# Patient Record
Sex: Female | Born: 1985
Health system: Southern US, Community
[De-identification: ages and names within clinical notes are randomized; demographics above are authoritative.]

## PROBLEM LIST (undated history)

## (undated) ENCOUNTER — Inpatient Hospital Stay (HOSPITAL_COMMUNITY): Payer: Self-pay

## (undated) ENCOUNTER — Ambulatory Visit: Admission: EM | Payer: 59 | Source: Home / Self Care

## (undated) DIAGNOSIS — E039 Hypothyroidism, unspecified: Secondary | ICD-10-CM

## (undated) DIAGNOSIS — Z789 Other specified health status: Secondary | ICD-10-CM

## (undated) DIAGNOSIS — Q676 Pectus excavatum: Secondary | ICD-10-CM

## (undated) HISTORY — DX: Pectus excavatum: Q67.6

## (undated) HISTORY — PX: OTHER SURGICAL HISTORY: SHX169

## (undated) HISTORY — PX: APPENDECTOMY: SHX54

---

## 2007-03-08 HISTORY — PX: TONSILLECTOMY AND ADENOIDECTOMY: SUR1326

## 2007-12-26 ENCOUNTER — Observation Stay (HOSPITAL_COMMUNITY): Admission: EM | Admit: 2007-12-26 | Discharge: 2007-12-27 | Payer: Self-pay | Admitting: Emergency Medicine

## 2007-12-26 ENCOUNTER — Encounter (INDEPENDENT_AMBULATORY_CARE_PROVIDER_SITE_OTHER): Payer: Self-pay | Admitting: General Surgery

## 2008-05-27 ENCOUNTER — Ambulatory Visit: Payer: Self-pay | Admitting: Internal Medicine

## 2008-07-25 ENCOUNTER — Ambulatory Visit: Payer: Self-pay | Admitting: Internal Medicine

## 2009-03-10 ENCOUNTER — Ambulatory Visit: Payer: Self-pay | Admitting: Internal Medicine

## 2009-06-08 ENCOUNTER — Ambulatory Visit: Payer: Self-pay | Admitting: Internal Medicine

## 2010-07-20 NOTE — H&P (Signed)
Carla Gonzalez, Carla Gonzalez                ACCOUNT NO.:  [id number]   MEDICAL RECORD NO.:  [id number]          PATIENT TYPE:  INP   LOCATION:  0098                         FACILITY:  [facility]   PHYSICIAN:  Juanetta Gosling, MDDATE OF BIRTH:  [birth year]   DATE OF ADMISSION:  12/26/2007  DATE OF DISCHARGE:                              HISTORY & PHYSICAL   CHIEF COMPLAINT:  Right lower quadrant pain.   HISTORY OF PRESENT ILLNESS:  This is a 25 year old female with two days  emesis, generalized abdominal pain, now localized to the right lower  quadrant.  She has been having pressure with bowel movements, denies any  fevers.  She comes into the emergency room for evaluation of her  abdominal pain.\   PAST MEDICAL HISTORY:  Negative.   PAST SURGICAL HISTORY:  Tonsillectomy.   FAMILY HISTORY:  Negative.   ALLERGIES:  SULFA.   MEDICATIONS:  None.   REVIEW OF SYSTEMS:  Negative.   SOCIAL HISTORY:  She is a Chief of Staff.   PHYSICAL EXAMINATION:  VITAL SIGNS:  Temperature 98.7, pulse 80,  respirations 18, blood pressure 130/78.  GENERAL:  She is a well-appearing young female in no distress.  NECK:  Supple without adenopathy.  CHEST:  Clear bilaterally.  HEART:  Regular rate and rhythm.  ABDOMEN:  Soft.  She is tender to percussion of the right lower  quadrant.  Bowel sounds present.  EXTREMITIES:  Showed no edema.   LABORATORY DATA:  Her UA is negative for infection.  BUN and creatinine  13 and 0.84.  LFTs are normal.  Lipase 23.  HCG is negative, white blood  cell count is 11, hematocrit is 43, platelets are 296.   She has a CT scan of her abdomen and pelvis that shows a retrocecal  appendix that is dilated with periappendiceal inflammation indicative of  acute appendicitis.  No evidence of rupture or abscess.   IMPRESSION:  Acute appendicitis.   PLAN OF ACTION:  N.p.o. with admission.  IV antibiotics.  Laparoscopic,  possible open, appendectomy.      Juanetta Gosling, MD  Electronically Signed     MCW/MEDQ  D:  12/26/2007  T:  12/27/2007  Job:  [id number]

## 2010-07-20 NOTE — Op Note (Signed)
NAMEFABIHA, Carla Gonzalez                ACCOUNT NO.:  [id number]   MEDICAL RECORD NO.:  [id number]          PATIENT TYPE:  INP   LOCATION:  0098                         FACILITY:  [facility]   PHYSICIAN:  Juanetta Gosling, MDDATE OF BIRTH:  1986-02-04   DATE OF PROCEDURE:  12/27/2007  DATE OF DISCHARGE:                               OPERATIVE REPORT   PREOPERATIVE DIAGNOSIS:  Acute appendicitis.   POSTOPERATIVE DIAGNOSIS:  Acute suppurative appendicitis.   PROCEDURE:  Laparoscopic converted to open appendectomy.   SURGEON:  Juanetta Gosling, MD.   ASSISTANT:  None.   ANESTHESIA:  General.   FINDINGS:  Acute suppurative appendicitis adherent to the cecum with  extensive inflammation.   SPECIMENS:  Appendix to pathology.   ESTIMATED BLOOD LOSS:  Minimal.   COMPLICATIONS:  None.   DISPOSITION:  To PACU in stable condition.   HISTORY:  Carla Gonzalez is a 25 year old female who has about 2 days of  generalized abdominal pain that now is localized to her right lower  quadrant associated with some emesis as well as a white blood cell count  of 11.  She underwent a CT scan that showed acute appendicitis with  periappendiceal inflammation and a retrocecal appendix.  Plan for  laparoscopic,  possible open appendectomy.   PROCEDURE:  After informed consent was obtained, patient was first  administered 1 gram Invanz without complication.  She was taken to the  operating room.  Sequential compression devices were placed on her lower  extremities throughout the operation.  She then underwent general  endotracheal anesthesia without complication.  Foley catheter was then  placed.  Her left arm was tucked and appropriately padded.  A surgical  time out was then performed.  The abdomen was then prepped and draped in  sterile surgical fashion.  A 10 mm vertical incision was made below her  umbilicus and cut here down to the level of her fascia which was entered  sharply.  Her peritoneum was then  entered bluntly and Vicryl purse-  string suture was then placed around the fascia.  Hasson trocar was  inserted and the abdomen was insufflated to 15 mmHg pressure.  Further 5  mm port was placed in the suprapubic region and further was placed in  the right upper quadrant after infiltration with local anesthetic under  direct vision without complication.  Her appendix was noted to be  retrocecal.  The white line of Toldt was mobilized and the cecum rolled  medially exposing the appendix.  There was some dense inflammation  surrounding the appendix.  The appendix was eventually able to be  grasped and the base was identified.  The appendix for its entire length  was folded back on the cecum and was very densely adherent.  I began  dissecting this free but was not able to get a good plane and to  determine exactly where the colon and the appendix were.  I thought that  it was prudent at this point to perform an open appendectomy due to the  dense inflammation surrounding the cecum.  I desufflated the abdomen and  removed the trocars and tied the umbilical suture down with no evidence  of a hernia.  I then made an approximately 4.5 cm incision in the right  lower quadrant, entered the external abdominal oblique, did a muscle  splitting technique and identified peritoneum, which I entered sharply.  Upon doing this I rolled the cecum up into the wound.  I inspected the  entire cecum where I had taken it down from the side wall, this was  without injury.  I identified the appendix, brought that up into the  field.  The appendix was still very difficult to dissect from the cecum,  even open.  I was able to easily get around the base of the appendix and  use a GIA 55 stapler to staple across the base.  Following this I used  dissection and electrocautery to dissect the adhesions to the cecum  which were very extensive.  Again, this was very difficult but done  under direct vision without any  injury to the cecum.  Following this the  appendiceal mesentery was left, this was then clamped, the appendix was  removed and this was tied with a 2-0 Vicryl suture.  I inspected the  staple line and it was clean and was hemostatic.  I inspected the cecum  again and it all was without any injury.  Following this I then closed  the peritoneum with a 2-0 Vicryl.  The obliques were then closed with a  2-0 PDS.  Irrigation was performed.  I then closed all the incisions  with a 4-0 Monocryl in a subcuticular fashion.  Dermabond was then  placed over the wounds.  Foley catheter was removed in the operating  room.  She was extubated and was then transferred to the recovery room  in stable condition.      Juanetta Gosling, MD  Electronically Signed     MCW/MEDQ  D:  12/27/2007  T:  12/27/2007  Job:  [id number]

## 2010-10-08 ENCOUNTER — Other Ambulatory Visit: Payer: Self-pay | Admitting: Obstetrics and Gynecology

## 2010-12-07 LAB — COMPREHENSIVE METABOLIC PANEL
ALT: 14
AST: 18
Creatinine, Ser: 0.84
GFR calc non Af Amer: >60
Sodium: 140
Total Bilirubin: 0.8

## 2010-12-07 LAB — URINALYSIS, ROUTINE W REFLEX MICROSCOPIC
Hgb urine dipstick: NEGATIVE
Ketones, ur: NEGATIVE
Specific Gravity, Urine: 1.024
pH: 6.5

## 2010-12-07 LAB — LIPASE, BLOOD: Lipase: 23

## 2010-12-07 LAB — URINE MICROSCOPIC-ADD ON

## 2010-12-07 LAB — DIFFERENTIAL
Basophils Absolute: 0.1
Basophils Relative: 1
Eosinophils Absolute: 0.2
Eosinophils Relative: 2
Monocytes Absolute: 0.9
Neutrophils Relative %: 66

## 2010-12-07 LAB — POCT PREGNANCY, URINE: Preg Test, Ur: NEGATIVE

## 2010-12-07 LAB — CBC
HCT: 43
WBC: 11 — ABNORMAL HIGH

## 2011-09-01 ENCOUNTER — Encounter: Payer: Self-pay | Admitting: Internal Medicine

## 2011-09-01 ENCOUNTER — Ambulatory Visit (INDEPENDENT_AMBULATORY_CARE_PROVIDER_SITE_OTHER): Payer: 59 | Admitting: Internal Medicine

## 2011-09-01 VITALS — BP 122/76 | HR 76 | Temp 99.8°F | Ht 67.0 in | Wt 179.0 lb

## 2011-09-01 DIAGNOSIS — H6690 Otitis media, unspecified, unspecified ear: Secondary | ICD-10-CM

## 2011-09-01 DIAGNOSIS — H6693 Otitis media, unspecified, bilateral: Secondary | ICD-10-CM

## 2011-09-01 DIAGNOSIS — J069 Acute upper respiratory infection, unspecified: Secondary | ICD-10-CM

## 2011-09-02 ENCOUNTER — Encounter: Payer: Self-pay | Admitting: Internal Medicine

## 2011-09-02 NOTE — Progress Notes (Signed)
  Subjective:    Patient ID: Carla Gonzalez, female    DOB: 1985/11/25, 26 y.o.   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 Has hoarseness cough and congestion. Cough is productive. No documented fever Last menstrual period was 08/30/2011. She is on Microgestin for contraception. She had an appendectomy in 2009. Tonsillectomy and adenoidectomy at age 73. Placement of chronic tubes as a child. She is allergic to sulfa causes hives. Since I last saw her in 2011, she has gotten married. This seems to be going well. Her husband as a Games developer.    Review of Systems     Objective:   Physical Exam HEENT exam reveals bilateral otitis media with TMs dull red and retracted. Pharynx is slightly injected. Neck is supple without significant adenopathy. Chest clear.        Assessment & Plan:  Bilateral otitis media  URI  Plan: Zithromax Z-Pak take 2 tablets by mouth day one followed by 1 tablet by mouth days 2 through 5. If not better in one week have prescription refilled.

## 2011-09-02 NOTE — Patient Instructions (Addendum)
Take Zithromax Z-PAK 2 tablets by mouth day one followed by 1 tablet by mouth days 2 through 5. Not better in one week have prescription refilled.

## 2011-09-05 ENCOUNTER — Telehealth: Payer: Self-pay | Admitting: Internal Medicine

## 2011-09-05 NOTE — Telephone Encounter (Signed)
Have pt refill Z-pak and call Friday am if no better.

## 2011-09-05 NOTE — Telephone Encounter (Signed)
Patient informed. 

## 2012-10-26 ENCOUNTER — Other Ambulatory Visit: Payer: Self-pay | Admitting: Obstetrics and Gynecology

## 2013-11-13 ENCOUNTER — Other Ambulatory Visit: Payer: Self-pay | Admitting: Obstetrics and Gynecology

## 2013-11-14 LAB — CYTOLOGY - PAP

## 2014-07-10 ENCOUNTER — Ambulatory Visit (INDEPENDENT_AMBULATORY_CARE_PROVIDER_SITE_OTHER): Payer: Managed Care, Other (non HMO) | Admitting: Internal Medicine

## 2014-07-10 ENCOUNTER — Encounter: Payer: Self-pay | Admitting: Internal Medicine

## 2014-07-10 VITALS — BP 106/74 | HR 89 | Temp 98.7°F | Ht 67.0 in | Wt 180.0 lb

## 2014-07-10 DIAGNOSIS — J029 Acute pharyngitis, unspecified: Secondary | ICD-10-CM

## 2014-07-10 DIAGNOSIS — H65113 Acute and subacute allergic otitis media (mucoid) (sanguinous) (serous), bilateral: Secondary | ICD-10-CM

## 2014-07-10 LAB — POCT RAPID STREP A (OFFICE): Rapid Strep A Screen: NEGATIVE

## 2014-07-10 MED ORDER — FLUCONAZOLE 150 MG PO TABS: ORAL_TABLET | ORAL | Status: DC

## 2014-07-10 MED ORDER — CLARITHROMYCIN 500 MG PO TABS: 500.0000 mg | ORAL_TABLET | Freq: Two times a day (BID) | ORAL | Status: DC

## 2014-07-10 NOTE — Patient Instructions (Signed)
Take Biaxin 500 mg twice daily for 10 days. Diflucan if needed for Candida vaginitis. Take Sudafed PE for congestion.

## 2014-07-10 NOTE — Progress Notes (Addendum)
   Subjective:    Patient ID: Carla Gonzalez, female    DOB: May 04, 1985, 29 y.o.   MRN: [id number]  HPI  Patient has had ear pain and sore throat for several days. Has begun to cough up discolored sputum. No fever or shaking chills. Not seen here since 2013. She is a Marine scientist.  History of tonsillectomy and adenoidectomy. History of appendectomy. Had tubes in her ears as a child. Allergic to sulfa. Going on trip to [place] in one week.  She is an Therapist, sports at [employer]. She is married to a Dealer. Does not smoke or consume alcohol. No children.  Family history: Both parents living in good health. Father with history of hypertension.    Review of Systems     Objective:   Physical Exam  Skin warm and dry. Pharynx slightly injected. Rapid strep screen negative. TMs are full and red bilaterally. Neck supple without adenopathy. Chest clear to auscultation.      Assessment & Plan:  Bilateral otitis media  Pharyngitis  Plan: Biaxin 500 mg twice daily for 10 days. Diflucan 150 mg tablet x 2 should Candida vaginitis develop while on antibiotics.

## 2014-08-08 ENCOUNTER — Ambulatory Visit (INDEPENDENT_AMBULATORY_CARE_PROVIDER_SITE_OTHER): Payer: Managed Care, Other (non HMO) | Admitting: Internal Medicine

## 2014-08-08 ENCOUNTER — Encounter: Payer: Self-pay | Admitting: Internal Medicine

## 2014-08-08 VITALS — BP 108/72 | HR 74 | Temp 98.9°F | Wt 181.0 lb

## 2014-08-08 DIAGNOSIS — J329 Chronic sinusitis, unspecified: Secondary | ICD-10-CM | POA: Diagnosis not present

## 2014-08-08 MED ORDER — LEVOFLOXACIN 500 MG PO TABS: 500.0000 mg | ORAL_TABLET | Freq: Every day | ORAL | Status: DC

## 2014-08-08 MED ORDER — METHYLPREDNISOLONE ACETATE 80 MG/ML IJ SUSP
80.0000 mg | Freq: Once | INTRAMUSCULAR | Status: AC
  Administered 2014-08-08: 80 mg via INTRAMUSCULAR

## 2014-08-08 MED ORDER — CEFTRIAXONE SODIUM 1 G IJ SOLR
1.0000 g | Freq: Once | INTRAMUSCULAR | Status: AC
  Administered 2014-08-08: 1 g via INTRAMUSCULAR

## 2014-08-08 NOTE — Progress Notes (Signed)
   Subjective:    Patient ID: Carla Gonzalez, female    DOB: 1985-07-13, 29 y.o.   MRN: [id number]  HPI  Was seen May 5 with sinusitis and bilateral serous otitis media. Was treated with Biaxin 500 mg twice daily for 10 days and improved. Subsequently went on trip to [place] and returned on May 21. Then she had to go to [facility] for a review course. 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 Earlier this week she felt feverish and had some chills. Not much cough but has thick discolored nasal drainage and ear pain. Has malaise and fatigue. Contacted the office earlier today about an appointment.    Review of Systems     Objective:   Physical Exam  TMs are red and dull bilaterally. Pharynx is slightly injected. Neck is supple without adenopathy. Chest clear to auscultation without rales or wheezing      Assessment & Plan:  Acute bilateral otitis media  Acute sinusitis  Plan: Rocephin 1 g IM and Depo-Medrol 80 mg IM. Levaquin 500 milligrams daily for 10 days.

## 2014-08-08 NOTE — Patient Instructions (Signed)
Levaquin 500 mg daily with a meal for 10 days. Rest and drink plenty of fluids.

## 2015-02-17 ENCOUNTER — Other Ambulatory Visit: Payer: Self-pay | Admitting: Obstetrics and Gynecology

## 2015-02-18 LAB — CYTOLOGY - PAP

## 2015-07-23 DIAGNOSIS — H5213 Myopia, bilateral: Secondary | ICD-10-CM | POA: Diagnosis not present

## 2015-07-23 DIAGNOSIS — H52203 Unspecified astigmatism, bilateral: Secondary | ICD-10-CM | POA: Diagnosis not present

## 2015-11-17 ENCOUNTER — Ambulatory Visit (HOSPITAL_COMMUNITY)
Admission: RE | Admit: 2015-11-17 | Discharge: 2015-11-17 | Disposition: A | Discharge status: Home / Self Care | Payer: 59 | Source: Ambulatory Visit | Attending: Physician Assistant | Admitting: Physician Assistant

## 2015-11-17 ENCOUNTER — Other Ambulatory Visit: Payer: Self-pay | Admitting: Physician Assistant

## 2015-11-17 DIAGNOSIS — S99912A Unspecified injury of left ankle, initial encounter: Secondary | ICD-10-CM

## 2015-11-17 DIAGNOSIS — M79662 Pain in left lower leg: Secondary | ICD-10-CM | POA: Diagnosis not present

## 2015-11-17 DIAGNOSIS — R52 Pain, unspecified: Secondary | ICD-10-CM | POA: Diagnosis not present

## 2015-11-17 DIAGNOSIS — M7989 Other specified soft tissue disorders: Secondary | ICD-10-CM | POA: Diagnosis not present

## 2015-11-17 DIAGNOSIS — X58XXXA Exposure to other specified factors, initial encounter: Secondary | ICD-10-CM | POA: Insufficient documentation

## 2015-11-17 NOTE — Progress Notes (Signed)
Carla Gonzalez fell down the stairs at work last week. No initially significant injury, but she has developed edema, ecchymosis and pain with ambulation L ankle up to her knee.   Get X rays and Ortho referral if +.  Rosaria Ferries, PA-C 11/17/2015 1:16 PM Beeper [phone]

## 2015-11-20 ENCOUNTER — Other Ambulatory Visit: Payer: Self-pay | Admitting: Internal Medicine

## 2015-11-23 ENCOUNTER — Encounter: Payer: Self-pay | Admitting: Internal Medicine

## 2016-01-19 ENCOUNTER — Other Ambulatory Visit: Payer: 59 | Admitting: Internal Medicine

## 2016-01-19 DIAGNOSIS — Z130 Encounter for screening for diseases of the blood and blood-forming organs and certain disorders involving the immune mechanism: Secondary | ICD-10-CM | POA: Diagnosis not present

## 2016-01-19 DIAGNOSIS — Z1329 Encounter for screening for other suspected endocrine disorder: Secondary | ICD-10-CM | POA: Diagnosis not present

## 2016-01-19 DIAGNOSIS — Z1321 Encounter for screening for nutritional disorder: Secondary | ICD-10-CM | POA: Diagnosis not present

## 2016-01-19 DIAGNOSIS — Z13220 Encounter for screening for lipoid disorders: Secondary | ICD-10-CM | POA: Diagnosis not present

## 2016-01-19 DIAGNOSIS — Z0000 Encounter for general adult medical examination without abnormal findings: Secondary | ICD-10-CM

## 2016-01-19 LAB — CBC WITH DIFFERENTIAL/PLATELET
BASOS ABS: 0 {cells}/uL (ref 0–200)
Basophils Relative: 0 %
EOS ABS: 162 {cells}/uL (ref 15–500)
Eosinophils Relative: 2 %
HEMATOCRIT: 43 % (ref 35.0–45.0)
Hemoglobin: 14.4 g/dL (ref 11.7–15.5)
LYMPHS PCT: 29 %
Lymphs Abs: 2349 cells/uL (ref 850–3900)
MCH: 30.2 pg (ref 27.0–33.0)
MCHC: 33.5 g/dL (ref 32.0–36.0)
MCV: 90.1 fL (ref 80.0–100.0)
MONOS PCT: 6 %
MPV: 9.2 fL (ref 7.5–12.5)
Monocytes Absolute: 486 cells/uL (ref 200–950)
NEUTROS PCT: 63 %
Neutro Abs: 5103 cells/uL (ref 1500–7800)
PLATELETS: 242 10*3/uL (ref 140–400)
RBC: 4.77 MIL/uL (ref 3.80–5.10)
RDW: 13.5 % (ref 11.0–15.0)
WBC: 8.1 10*3/uL (ref 3.8–10.8)

## 2016-01-19 LAB — COMPLETE METABOLIC PANEL WITH GFR
ALT: 12 U/L (ref 6–29)
AST: 17 U/L (ref 10–30)
Albumin: 4.5 g/dL (ref 3.6–5.1)
Alkaline Phosphatase: 83 U/L (ref 33–115)
BILIRUBIN TOTAL: 0.6 mg/dL (ref 0.2–1.2)
BUN: 12 mg/dL (ref 7–25)
CALCIUM: 9.3 mg/dL (ref 8.6–10.2)
CHLORIDE: 103 mmol/L (ref 98–110)
CO2: 28 mmol/L (ref 20–31)
Creat: 0.73 mg/dL (ref 0.50–1.10)
Glucose, Bld: 83 mg/dL (ref 65–99)
Potassium: 4.1 mmol/L (ref 3.5–5.3)
Sodium: 137 mmol/L (ref 135–146)
Total Protein: 6.9 g/dL (ref 6.1–8.1)

## 2016-01-19 LAB — LIPID PANEL
CHOLESTEROL: 152 mg/dL (ref <200)
HDL: 61 mg/dL (ref >50)
LDL CALC: 77 mg/dL (ref <100)
TRIGLYCERIDES: 71 mg/dL (ref <150)
Total CHOL/HDL Ratio: 2.5 Ratio (ref <5.0)
VLDL: 14 mg/dL (ref <30)

## 2016-01-19 LAB — TSH: TSH: 2.3 mIU/L

## 2016-01-20 LAB — VITAMIN D 25 HYDROXY (VIT D DEFICIENCY, FRACTURES): Vit D, 25-Hydroxy: 36 ng/mL (ref 30–100)

## 2016-01-22 ENCOUNTER — Encounter: Payer: Self-pay | Admitting: Internal Medicine

## 2016-01-22 ENCOUNTER — Ambulatory Visit (INDEPENDENT_AMBULATORY_CARE_PROVIDER_SITE_OTHER): Payer: 59 | Admitting: Internal Medicine

## 2016-01-22 VITALS — BP 120/84 | HR 85 | Temp 99.5°F | Ht 67.0 in | Wt 165.0 lb

## 2016-01-22 DIAGNOSIS — Z0000 Encounter for general adult medical examination without abnormal findings: Secondary | ICD-10-CM | POA: Diagnosis not present

## 2016-01-22 DIAGNOSIS — J069 Acute upper respiratory infection, unspecified: Secondary | ICD-10-CM

## 2016-01-22 LAB — POCT URINALYSIS DIPSTICK
BILIRUBIN UA: NEGATIVE
Glucose, UA: NEGATIVE
LEUKOCYTES UA: NEGATIVE
NITRITE UA: NEGATIVE
PH UA: 7
Protein, UA: NEGATIVE
Spec Grav, UA: 1.01
UROBILINOGEN UA: NEGATIVE

## 2016-01-22 MED ORDER — LEVOFLOXACIN 500 MG PO TABS: 500.0000 mg | ORAL_TABLET | Freq: Every day | ORAL | 0 refills | Status: DC

## 2016-01-22 MED FILL — levoFLOXacin 500 MG TABS: 500 | 10 days supply | Qty: 10 | Fill #0

## 2016-01-22 NOTE — Progress Notes (Signed)
   Subjective:    Patient ID: Carla Gonzalez, female    DOB: 1985-12-14, 30 y.o.   MRN: [id number]  HPI  30 year old Female in today for health maintenance exam and evaluation of medical issues. She has an acute URI. Has had nasal congestion and cough. Some malaise and fatigue.  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 She likes this.  Social history: She is married. Thinking of starting a family. Husband is a Dealer. 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  Says she had Tdap vaccine 2 years ago. Had flu vaccine in October.  She is allergic to Sulfa- it causes hives.  No history of serious illnesses  or accidents. Had appendectomy in 2009.  Family history: Parents in good health. Younger sister in good health.      Review of Systems  Respiratory: Positive for cough.   Cardiovascular: Negative.   Gastrointestinal: Negative.   Genitourinary: Negative.   Musculoskeletal: Negative.   Neurological: Negative.   Hematological: Negative.   Psychiatric/Behavioral: Negative.        Objective:   Physical Exam  Constitutional: She is oriented to person, place, and time. She appears well-developed and well-nourished. No distress.  HENT:  Head: Normocephalic and atraumatic.  Right Ear: External ear normal.  Left Ear: External ear normal.  Mouth/Throat: Oropharynx is clear and moist. No oropharyngeal exudate.  Eyes: Conjunctivae and EOM are normal. Pupils are equal, round, and reactive to light. Right eye exhibits no discharge. Left eye exhibits no discharge. No scleral icterus.  Neck: Neck supple. No JVD present. No thyromegaly present.  Cardiovascular: Normal rate, regular rhythm, normal heart sounds and intact distal pulses.   Pulmonary/Chest: Effort normal and breath sounds normal. No respiratory distress. She has no wheezes. She has no rales.  Abdominal: She exhibits no distension and no  mass. There is no tenderness. There is no rebound and no guarding.  Genitourinary:  Genitourinary Comments: Pap done by Dr. Vanessa Kick December 2016  Musculoskeletal: She exhibits no edema.  Lymphadenopathy:    She has no cervical adenopathy.  Neurological: She is alert and oriented to person, place, and time. She has normal reflexes. No cranial nerve deficit. Coordination normal.  Skin: Skin is warm and dry. No rash noted. She is not diaphoretic.  Psychiatric: She has a normal mood and affect. Her behavior is normal. Judgment and thought content normal.  Vitals reviewed.         Assessment & Plan:  Acute URI  Normal health maintenance exam  Plan: Levaquin 500 milligrams daily for 10 days. Take with food. Lab work reviewed with her entirely within normal limits.

## 2016-02-01 NOTE — Patient Instructions (Addendum)
It was a pleasure to see you today. Take Levaquin 500 mg daily with a meal for 10 days for respiratory infection

## 2016-02-24 ENCOUNTER — Other Ambulatory Visit: Payer: Self-pay | Admitting: Obstetrics and Gynecology

## 2016-02-24 DIAGNOSIS — N926 Irregular menstruation, unspecified: Secondary | ICD-10-CM | POA: Diagnosis not present

## 2016-02-24 DIAGNOSIS — Z8481 Family history of carrier of genetic disease: Secondary | ICD-10-CM | POA: Diagnosis not present

## 2016-02-24 DIAGNOSIS — Z01419 Encounter for gynecological examination (general) (routine) without abnormal findings: Secondary | ICD-10-CM | POA: Diagnosis not present

## 2016-02-24 DIAGNOSIS — Z124 Encounter for screening for malignant neoplasm of cervix: Secondary | ICD-10-CM | POA: Diagnosis not present

## 2016-02-25 LAB — CYTOLOGY - PAP

## 2016-02-26 ENCOUNTER — Ambulatory Visit (INDEPENDENT_AMBULATORY_CARE_PROVIDER_SITE_OTHER): Payer: 59 | Admitting: Internal Medicine

## 2016-02-26 ENCOUNTER — Encounter: Payer: Self-pay | Admitting: Internal Medicine

## 2016-02-26 ENCOUNTER — Other Ambulatory Visit: Payer: Self-pay | Admitting: Internal Medicine

## 2016-02-26 VITALS — HR 102 | Temp 99.0°F | Resp 18 | Wt 174.0 lb

## 2016-02-26 DIAGNOSIS — H6692 Otitis media, unspecified, left ear: Secondary | ICD-10-CM | POA: Diagnosis not present

## 2016-02-26 DIAGNOSIS — R509 Fever, unspecified: Secondary | ICD-10-CM | POA: Diagnosis not present

## 2016-02-26 DIAGNOSIS — J0100 Acute maxillary sinusitis, unspecified: Secondary | ICD-10-CM

## 2016-02-26 DIAGNOSIS — Z3A01 Less than 8 weeks gestation of pregnancy: Secondary | ICD-10-CM

## 2016-02-26 DIAGNOSIS — O200 Threatened abortion: Secondary | ICD-10-CM | POA: Diagnosis not present

## 2016-02-26 LAB — INFLUENZA A AND B AG, IMMUNOASSAY
INFLUENZA B ANTIGEN: NOT DETECTED
Influenza A Antigen: NOT DETECTED

## 2016-02-26 MED ORDER — AZITHROMYCIN 250 MG PO TABS: ORAL_TABLET | ORAL | 0 refills | Status: DC

## 2016-02-26 MED FILL — AZITHROMYCIN 250 MG TABLET: 250 | 5 days supply | Qty: 6 | Fill #0

## 2016-02-26 NOTE — Patient Instructions (Addendum)
Zithromax Z-PAK take 2 tablets day one followed by 1 tablet days 2 through 5. Rest and drink plenty of fluids. Out of work until afebrile for 24 hours. May take Tylenol for fever. But take Robitussin for cough and Sudafed for congestion. Flu test was negative. Call if not better next week or sooner if worse.

## 2016-02-26 NOTE — Progress Notes (Signed)
   Subjective:    Patient ID: Carla Gonzalez, female    DOB: 06/12/1985, 30 y.o.   MRN: [id number]  HPI  Onset of fever last evening 101 degrees with URI symptoms x 4 days. She says she Is about [redacted] weeks pregnant. Has seen GYN, Dr. Harrington Challenger. Has malaise and fatigue. Discolored nasal drainage.  Exposed to a relative with influenza recently. She did take flu vaccine through employment.    Review of Systems      Objective:   Physical Exam Pharynx very slightly injected without exudate. Left TM is dull and red with splayed light reflex. Right TM slightly full but not red. Neck is supple without adenopathy. Chest clear to auscultation without rales or wheezing       Assessment & Plan:   Left otitis media  Acute sinusitis  Intrauterine pregnancy-approximately 2 weeks by history  Plan: May take Tylenol for fever, Robitussin for cough. Zithromax Z-PAK take 2 tablets day one followed by 1 tablet days 2 through 5. Rest and drink plenty of fluids. May take Sudafed for congestion. She called out of work today and is not scheduled to work again until next week. Needs to be afebrile for 24 hours before returning to work.  Rapid influenza test is negative for influenza A and B and patient was called and informed of this result.

## 2016-03-02 ENCOUNTER — Encounter (HOSPITAL_COMMUNITY): Payer: Self-pay

## 2016-03-02 ENCOUNTER — Inpatient Hospital Stay (HOSPITAL_COMMUNITY)
Admission: AD | Admit: 2016-03-02 | Discharge: 2016-03-02 | Disposition: A | Discharge status: Home / Self Care | Payer: 59 | Source: Ambulatory Visit | Attending: Obstetrics and Gynecology | Admitting: Obstetrics and Gynecology

## 2016-03-02 DIAGNOSIS — N939 Abnormal uterine and vaginal bleeding, unspecified: Secondary | ICD-10-CM | POA: Diagnosis not present

## 2016-03-02 DIAGNOSIS — Z8249 Family history of ischemic heart disease and other diseases of the circulatory system: Secondary | ICD-10-CM | POA: Diagnosis not present

## 2016-03-02 DIAGNOSIS — Z882 Allergy status to sulfonamides status: Secondary | ICD-10-CM | POA: Diagnosis not present

## 2016-03-02 DIAGNOSIS — O039 Complete or unspecified spontaneous abortion without complication: Secondary | ICD-10-CM | POA: Diagnosis not present

## 2016-03-02 HISTORY — DX: Other specified health status: Z78.9

## 2016-03-02 LAB — CBC
HCT: 39.4 % (ref 36.0–46.0)
Hemoglobin: 13.7 g/dL (ref 12.0–15.0)
MCH: 30.2 pg (ref 26.0–34.0)
MCHC: 34.8 g/dL (ref 30.0–36.0)
MCV: 86.8 fL (ref 78.0–100.0)
PLATELETS: 253 10*3/uL (ref 150–400)
RBC: 4.54 MIL/uL (ref 3.87–5.11)
RDW: 13.2 % (ref 11.5–15.5)
WBC: 8.5 10*3/uL (ref 4.0–10.5)

## 2016-03-02 LAB — URINALYSIS, ROUTINE W REFLEX MICROSCOPIC
Bilirubin Urine: NEGATIVE
GLUCOSE, UA: NEGATIVE mg/dL
Ketones, ur: NEGATIVE mg/dL
Leukocytes, UA: NEGATIVE
NITRITE: NEGATIVE
Protein, ur: 30 mg/dL — AB
SPECIFIC GRAVITY, URINE: 1.021 (ref 1.005–1.030)
pH: 7 (ref 5.0–8.0)

## 2016-03-02 LAB — POCT PREGNANCY, URINE: Preg Test, Ur: POSITIVE — AB

## 2016-03-02 LAB — HCG, QUANTITATIVE, PREGNANCY: hCG, Beta Chain, Quant, S: 15 m[IU]/mL — ABNORMAL HIGH (ref <5)

## 2016-03-02 LAB — ABO/RH: ABO/RH(D): O POS

## 2016-03-02 NOTE — MAU Note (Signed)
Pregnancy confirmed in OB office last Wed.  Had blood work done, hcg level was in the 40's, repeated on Friday was 50.  Started bleeding last night, was kind of heavy.  Started having mild cramps this morning.

## 2016-03-02 NOTE — Discharge Instructions (Signed)
Return to care  °· If you have heavier bleeding that soaks through more that 2 pads per hour for an hour or more °· If you bleed so much that you feel like you might pass out or you do pass out °· If you have significant abdominal pain that is not improved with Tylenol  °· If you develop a fever > 100.5 ° ° ° ° °Miscarriage °A miscarriage is the sudden loss of an unborn baby (fetus) before the 20th week of pregnancy. Most miscarriages happen in the first 3 months of pregnancy. Sometimes, it happens before a woman even knows she is pregnant. A miscarriage is also called a "spontaneous miscarriage" or "early pregnancy loss." Having a miscarriage can be an emotional experience. Talk with your caregiver about any questions you may have about miscarrying, the grieving process, and your future pregnancy plans. °What are the causes? °· Problems with the fetal chromosomes that make it impossible for the baby to develop normally. Problems with the baby's genes or chromosomes are most often the result of errors that occur, by chance, as the embryo divides and grows. The problems are not inherited from the parents. °· Infection of the cervix or uterus. °· Hormone problems. °· Problems with the cervix, such as having an incompetent cervix. This is when the tissue in the cervix is not strong enough to hold the pregnancy. °· Problems with the uterus, such as an abnormally shaped uterus, uterine fibroids, or congenital abnormalities. °· Certain medical conditions. °· Smoking, drinking alcohol, or taking illegal drugs. °· Trauma. °Often, the cause of a miscarriage is unknown. °What are the signs or symptoms? °· Vaginal bleeding or spotting, with or without cramps or pain. °· Pain or cramping in the abdomen or lower back. °· Passing fluid, tissue, or blood clots from the vagina. °How is this diagnosed? °Your caregiver will perform a physical exam. You may also have an ultrasound to confirm the miscarriage. Blood or urine tests may  also be ordered. °How is this treated? °· Sometimes, treatment is not necessary if you naturally pass all the fetal tissue that was in the uterus. If some of the fetus or placenta remains in the body (incomplete miscarriage), tissue left behind may become infected and must be removed. Usually, a dilation and curettage (D and C) procedure is performed. During a D and C procedure, the cervix is widened (dilated) and any remaining fetal or placental tissue is gently removed from the uterus. °· Antibiotic medicines are prescribed if there is an infection. Other medicines may be given to reduce the size of the uterus (contract) if there is a lot of bleeding. °· If you have Rh negative blood and your baby was Rh positive, you will need a Rh immunoglobulin shot. This shot will protect any future baby from having Rh blood problems in future pregnancies. °Follow these instructions at home: °· Your caregiver may order bed rest or may allow you to continue light activity. Resume activity as directed by your caregiver. °· Have someone help with home and family responsibilities during this time. °· Keep track of the number of sanitary pads you use each day and how soaked (saturated) they are. Write down this information. °· Do not use tampons. Do not douche or have sexual intercourse until approved by your caregiver. °· Only take over-the-counter or prescription medicines for pain or discomfort as directed by your caregiver. °· Do not take aspirin. Aspirin can cause bleeding. °· Keep all follow-up appointments with your caregiver. °·   If you or your partner have problems with grieving, talk to your caregiver or seek counseling to help cope with the pregnancy loss. Allow enough time to grieve before trying to get pregnant again. °Get help right away if: °· You have severe cramps or pain in your back or abdomen. °· You have a fever. °· You pass large blood clots (walnut-sized or larger) or tissue from your vagina. Save any tissue  for your caregiver to inspect. °· Your bleeding increases. °· You have a thick, bad-smelling vaginal discharge. °· You become lightheaded, weak, or you faint. °· You have chills. °This information is not intended to replace advice given to you by your health care provider. Make sure you discuss any questions you have with your health care provider. °Document Released: 08/17/2000 Document Revised: 07/30/2015 Document Reviewed: 04/12/2011 °Elsevier Interactive Patient Education © 2017 [employer] ° °

## 2016-03-02 NOTE — MAU Provider Note (Signed)
History     CSN: [id number]  Arrival date and time: 03/02/16 [id number]   First Provider Initiated Contact with Patient 03/02/16 [phone]      Chief Complaint  Patient presents with  . Vaginal Bleeding  . Abdominal Cramping   HPI  Carla Gonzalez is a 30 y.o. G1P0 at [redacted]w[redacted]d who presents with abdominal cramping & vaginal bleeding. Patient was seen at [facility] last week for positive pregnancy test. Had BHCG 43 on Wednesday & 50 on Friday. Reports red spotting since yesterday; bleeding became heavier this morning and passed a few small clots. Has not saturated pads. Lower abdominal cramping associated with bleeding. Cramping is intermittent. Rates pain 1/10. Nothing makes better or worse. Denies n/v/d, constipation, vaginal discharge, or fever. Last intercourse 2 days ago. Last BM yesterday.   OB History    Gravida Para Term Preterm AB Living   1             SAB TAB Ectopic Multiple Live Births                  Past Medical History:  Diagnosis Date  . Contraception     Past Surgical History:  Procedure Laterality Date  . APPENDECTOMY    . Grommet    . TONSILLECTOMY AND ADENOIDECTOMY  2009    Family History  Problem Relation Age of Onset  . Hypertension Father     Social History  Substance Use Topics  . Smoking status: Never Smoker  . Smokeless tobacco: Never Used  . Alcohol use Not on file    Allergies:  Allergies  Allergen Reactions  . Sulfa Antibiotics Hives    Prescriptions Prior to Admission  Medication Sig Dispense Refill Last Dose  . azithromycin (ZITHROMAX) 250 MG tablet 2 po day 1 followed by one po days 2-5 6 tablet 0     Review of Systems  Constitutional: Negative.   Gastrointestinal: Positive for abdominal pain. Negative for constipation, diarrhea, nausea and vomiting.  Genitourinary:       + vaginal bleeding   Physical Exam   Blood pressure 125/83, pulse 66, temperature 98.3 F (36.8 C), resp. rate 18, height 5' 6.5" (1.689 m), weight 176  lb 8 oz (80.1 kg), last menstrual period 01/24/2016.  Physical Exam  Nursing note and vitals reviewed. Constitutional: She is oriented to person, place, and time. She appears well-developed and well-nourished. No distress.  HENT:  Head: Normocephalic and atraumatic.  Eyes: Conjunctivae are normal. Right eye exhibits no discharge. Left eye exhibits no discharge. No scleral icterus.  Neck: Normal range of motion.  Cardiovascular: Normal rate, regular rhythm and normal heart sounds.   No murmur heard. Respiratory: Effort normal and breath sounds normal. No respiratory distress. She has no wheezes.  GI: Soft. Bowel sounds are normal. She exhibits no distension. There is no tenderness. There is no rebound and no guarding.  Genitourinary: Uterus normal. Cervix exhibits no motion tenderness and no friability. Right adnexum displays no mass and no tenderness. Left adnexum displays no mass and no tenderness. There is bleeding (small amount of dark red blood cleared out with 2 fox swabs; no active bleeding) in the vagina.  Genitourinary Comments: Cervix closed  Neurological: She is alert and oriented to person, place, and time.  Skin: Skin is warm and dry. She is not diaphoretic.  Psychiatric: She has a normal mood and affect. Her behavior is normal. Judgment and thought content normal.    MAU Course  Procedures Results  for orders placed or performed during the hospital encounter of 03/02/16 (from the past 24 hour(s))  Pregnancy, urine POC     Status: Abnormal   Collection Time: 03/02/16  8:53 AM  Result Value Ref Range   Preg Test, Ur POSITIVE (A) NEGATIVE  Urinalysis, Routine w reflex microscopic     Status: Abnormal   Collection Time: 03/02/16  8:58 AM  Result Value Ref Range   Color, Urine YELLOW YELLOW   APPearance CLOUDY (A) CLEAR   Specific Gravity, Urine 1.021 1.005 - 1.030   pH 7.0 5.0 - 8.0   Glucose, UA NEGATIVE NEGATIVE mg/dL   Hgb urine dipstick LARGE (A) NEGATIVE   Bilirubin  Urine NEGATIVE NEGATIVE   Ketones, ur NEGATIVE NEGATIVE mg/dL   Protein, ur 30 (A) NEGATIVE mg/dL   Nitrite NEGATIVE NEGATIVE   Leukocytes, UA NEGATIVE NEGATIVE   RBC / HPF TOO NUMEROUS TO COUNT 0 - 5 RBC/hpf   WBC, UA 6-30 0 - 5 WBC/hpf   Bacteria, UA RARE (A) NONE SEEN   Squamous Epithelial / LPF 0-5 (A) NONE SEEN   Mucous PRESENT   CBC     Status: None   Collection Time: 03/02/16  9:30 AM  Result Value Ref Range   WBC 8.5 4.0 - 10.5 K/uL   RBC 4.54 3.87 - 5.11 MIL/uL   Hemoglobin 13.7 12.0 - 15.0 g/dL   HCT 39.4 36.0 - 46.0 %   MCV 86.8 78.0 - 100.0 fL   MCH 30.2 26.0 - 34.0 pg   MCHC 34.8 30.0 - 36.0 g/dL   RDW 13.2 11.5 - 15.5 %   Platelets 253 150 - 400 K/uL  ABO/Rh     Status: None (Preliminary result)   Collection Time: 03/02/16  9:30 AM  Result Value Ref Range   ABO/RH(D) O POS   hCG, quantitative, pregnancy     Status: Abnormal   Collection Time: 03/02/16  9:31 AM  Result Value Ref Range   hCG, Beta Chain, Quant, S 15 (H) <5 mIU/mL    MDM CBC, BHCG, abo/rh O positive Bhcg 15 today Discussed results with Dr. Ouida Sills. Ok to discharge home & have pt f/u in office within 2 weeks.  Discussed results with patient. Drop in Bhcg is consistent with miscarriage. Patient is appropriately tearful. Offered spiritual services; pt declines at this time.   Assessment and Plan  A:  1. Miscarriage    P: Discharge home Pelvic rest Call office for f/u in 2 weeks Comfort pack given Discussed reasons to return to [place] 03/02/2016, 9:10 AM

## 2016-03-09 DIAGNOSIS — O021 Missed abortion: Secondary | ICD-10-CM | POA: Diagnosis not present

## 2016-04-04 DIAGNOSIS — Z6827 Body mass index (BMI) 27.0-27.9, adult: Secondary | ICD-10-CM | POA: Diagnosis not present

## 2016-04-04 DIAGNOSIS — Z8279 Family history of other congenital malformations, deformations and chromosomal abnormalities: Secondary | ICD-10-CM | POA: Diagnosis not present

## 2016-04-04 DIAGNOSIS — Z3141 Encounter for fertility testing: Secondary | ICD-10-CM | POA: Diagnosis not present

## 2016-04-04 DIAGNOSIS — Q992 Fragile X chromosome: Secondary | ICD-10-CM | POA: Insufficient documentation

## 2016-04-14 ENCOUNTER — Other Ambulatory Visit: Payer: Self-pay | Admitting: Cardiovascular Disease

## 2016-04-14 MED ORDER — METAXALONE 800 MG PO TABS: 800.0000 mg | ORAL_TABLET | Freq: Three times a day (TID) | ORAL | 6 refills | Status: DC

## 2016-04-14 MED FILL — METAXALONE 800 MG TABLET: 800 | 10 days supply | Qty: 30 | Fill #0

## 2016-04-14 NOTE — Progress Notes (Signed)
Linsay has neck pain and stiffness Has taken Skelaxin in the past  Will send in a script    Mertie Moores, MD  04/14/2016 8:26 AM    [address],  [place] [place], [place]  [address] Pager [phone] Phone: [phone]; Fax: [phone]

## 2016-05-12 ENCOUNTER — Telehealth: Payer: Self-pay | Admitting: Internal Medicine

## 2016-05-12 MED ORDER — SCOPOLAMINE 1 MG/3DAYS TD PT72: 1.0000 | MEDICATED_PATCH | TRANSDERMAL | 0 refills | Status: DC

## 2016-05-12 MED FILL — SCOPOLAMINE 1 MG/3 DAY PATC: 1 | 30 days supply | Qty: 10 | Fill #0

## 2016-05-12 NOTE — Telephone Encounter (Signed)
Patient is leaving to go out of town tomorrow and will be flying.  She wants to know if you will prescribe something for motion sickness for her.  She is asking specifically for these discs that go behind the ear to use for motion sickness.  Can this be prescribed for her?  Pharmacy:  [facility]

## 2016-05-12 NOTE — Telephone Encounter (Signed)
SENT 

## 2016-05-12 NOTE — Telephone Encounter (Signed)
Call in transderm Scop transdermal patches apply behind ear q 3 days

## 2016-05-30 ENCOUNTER — Ambulatory Visit (INDEPENDENT_AMBULATORY_CARE_PROVIDER_SITE_OTHER): Payer: 59 | Admitting: Internal Medicine

## 2016-05-30 ENCOUNTER — Encounter: Payer: Self-pay | Admitting: Internal Medicine

## 2016-05-30 VITALS — BP 100/60 | HR 64 | Temp 99.1°F | Ht 67.0 in | Wt 176.0 lb

## 2016-05-30 DIAGNOSIS — H6693 Otitis media, unspecified, bilateral: Secondary | ICD-10-CM

## 2016-05-30 DIAGNOSIS — J0100 Acute maxillary sinusitis, unspecified: Secondary | ICD-10-CM | POA: Diagnosis not present

## 2016-05-30 DIAGNOSIS — J22 Unspecified acute lower respiratory infection: Secondary | ICD-10-CM

## 2016-05-30 MED ORDER — AZITHROMYCIN 250 MG PO TABS: ORAL_TABLET | ORAL | 0 refills | Status: DC

## 2016-05-30 MED FILL — AZITHROMYCIN 250 MG TABLET: 250 | 5 days supply | Qty: 6 | Fill #0

## 2016-05-30 NOTE — Patient Instructions (Signed)
It was a pleasure to see you today. Take Zithromax Z-PAK as directed. May take Robitussin and Sudafed if needed. Tylenol if needed for fever. Call if not better in 7-10 days or sooner if worse.

## 2016-05-30 NOTE — Progress Notes (Signed)
   Subjective:    Patient ID: Carla Gonzalez, female    DOB: 07/02/1985, 31 y.o.   MRN: [id number]  HPI   31 year old Female went to Cardiology [place] in [place] and returned with URI symptoms.  No fever or chills. Discolored sputum and nasal drainage.Says she has deep congested cough.  Was seen in November during health maintenance exam without acute URI treated with Levaquin.  Was seen December 22 with an acute left otitis media treated with Zithromax Z-PAK. At that time she was about [redacted] weeks pregnant. Unfortunately had a miscarriage 7 days later.  This time, she is due for menstrual period in a couple of weeks or so. Has not been using birth control. Hoping to conceive.  She sounds nasally congested when she speaks.    Review of Systems see above     Objective:   Physical Exam TMs are full and pink bilaterally. Pharynx slightly injected without exudate. Neck is supple. Chest clear to auscultation without rales or wheezing       Assessment & Plan:  Acute bilateral otitis media  Acute bronchitis  Acute maxillary sinusitis  Plan: Zithromax Z-PAK take 2 tablets day 1 followed by 1 tablet days 2 through 5. Robitussin or Sudafed as needed. Call if not better in 7-10 days or sooner if worse.

## 2016-08-12 ENCOUNTER — Other Ambulatory Visit: Payer: Self-pay

## 2016-08-12 DIAGNOSIS — D492 Neoplasm of unspecified behavior of bone, soft tissue, and skin: Secondary | ICD-10-CM | POA: Diagnosis not present

## 2016-09-23 DIAGNOSIS — E2839 Other primary ovarian failure: Secondary | ICD-10-CM | POA: Diagnosis not present

## 2016-09-23 DIAGNOSIS — Z148 Genetic carrier of other disease: Secondary | ICD-10-CM | POA: Diagnosis not present

## 2016-09-23 DIAGNOSIS — Z319 Encounter for procreative management, unspecified: Secondary | ICD-10-CM | POA: Diagnosis not present

## 2016-09-24 DIAGNOSIS — Z319 Encounter for procreative management, unspecified: Secondary | ICD-10-CM | POA: Diagnosis not present

## 2016-09-30 DIAGNOSIS — Z113 Encounter for screening for infections with a predominantly sexual mode of transmission: Secondary | ICD-10-CM | POA: Diagnosis not present

## 2016-12-14 ENCOUNTER — Other Ambulatory Visit: Payer: Self-pay | Admitting: Cardiovascular Disease

## 2016-12-14 DIAGNOSIS — R062 Wheezing: Secondary | ICD-10-CM

## 2016-12-14 MED ORDER — ALBUTEROL SULFATE HFA 108 (90 BASE) MCG/ACT IN AERS: 2.0000 | INHALATION_SPRAY | Freq: Four times a day (QID) | RESPIRATORY_TRACT | 2 refills | Status: DC | PRN

## 2016-12-14 MED ORDER — ALBUTEROL SULFATE HFA 108 (90 BASE) MCG/ACT IN AERS: 2.0000 | INHALATION_SPRAY | Freq: Four times a day (QID) | RESPIRATORY_TRACT | Status: DC | PRN

## 2016-12-14 MED FILL — VENTOLIN HFA 90 MCG INHALER: 108 (90 BAS | 25 days supply | Qty: 18 | Fill #0

## 2016-12-14 NOTE — Progress Notes (Signed)
Carla Gonzalez has been having cough and wheezing   Slight fever   Try claritin, zyrtec. 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 Will send in script for Albuterol HFA     Mertie Moores, MD  12/14/2016 11:02 AM    [address],  [place], [place]  [address] Pager [phone] Phone: [phone]; Fax: [phone]

## 2016-12-14 NOTE — Progress Notes (Signed)
Re ordered albuterol  As an outpatient medication

## 2017-01-06 DIAGNOSIS — Q51810 Arcuate uterus: Secondary | ICD-10-CM | POA: Diagnosis not present

## 2017-01-06 DIAGNOSIS — Z3141 Encounter for fertility testing: Secondary | ICD-10-CM | POA: Diagnosis not present

## 2017-01-06 DIAGNOSIS — N8500 Endometrial hyperplasia, unspecified: Secondary | ICD-10-CM | POA: Diagnosis not present

## 2017-01-06 DIAGNOSIS — Z319 Encounter for procreative management, unspecified: Secondary | ICD-10-CM | POA: Diagnosis not present

## 2017-01-06 MED FILL — DOXYCYCLINE HYCLATE 100 MG: 100 | 5 days supply | Qty: 10 | Fill #0

## 2017-01-10 MED FILL — CETROTIDE 0.25 MG KIT: 0.25 | 4 days supply | Qty: 4 | Fill #0

## 2017-01-10 MED FILL — BD 3 ML SYRINGE 18GX1-1/2: 18G X 1-1/2 | 30 days supply | Qty: 60 | Fill #0

## 2017-01-10 MED FILL — PROGESTERONE OIL 50 MG/ML V: 50 | 30 days supply | Qty: 30 | Fill #0

## 2017-01-10 MED FILL — BD NEEDLES 30GX0.5: 30G X 1/2" | 30 days supply | Qty: 25 | Fill #0

## 2017-01-10 MED FILL — ESTRADIOL 2 MG TABLET: 2 | 30 days supply | Qty: 60 | Fill #0

## 2017-01-10 MED FILL — DOXYCYCLINE HYCLATE 100 MG: 100 | 20 days supply | Qty: 40 | Fill #0

## 2017-01-10 MED FILL — BD 3 ML SYRINGE 18GX1-1/2": 18G X 1-1/2 | 30 days supply | Qty: 60 | Fill #0

## 2017-01-10 MED FILL — BD NEEDLES 22GX1.5: 22G X 1-1/2 | 30 days supply | Qty: 30 | Fill #0

## 2017-01-10 MED FILL — ESTRADIOL 0.1 MG PATCH: 0.1 | 30 days supply | Qty: 8 | Fill #0

## 2017-01-10 MED FILL — CHORIONIC GONAD 10,000 UNIT: 10000 | 1 days supply | Qty: 1 | Fill #0

## 2017-01-10 MED FILL — METHYLPREDNISOLONE 4 MG TAB: 4 | 4 days supply | Qty: 16 | Fill #0

## 2017-01-10 MED FILL — BD NEEDLES 30GX0.5": 30G X 1/2" | 30 days supply | Qty: 25 | Fill #0

## 2017-01-10 MED FILL — MENOPUR 75 UNIT VIAL: 75 | 20 days supply | Qty: 20 | Fill #0

## 2017-01-10 MED FILL — GONAL-F 1,050 UNITS VIAL: 1050 | 10 days supply | Qty: 3 | Fill #0

## 2017-01-10 MED FILL — SHARPS COLLECTOR 1.4QT: 30 days supply | Qty: 1 | Fill #0

## 2017-01-10 MED FILL — BD NEEDLES 22GX1.5": 22G X 1-1/2 | 30 days supply | Qty: 30 | Fill #0

## 2017-01-20 DIAGNOSIS — Z3183 Encounter for assisted reproductive fertility procedure cycle: Secondary | ICD-10-CM | POA: Diagnosis not present

## 2017-01-23 DIAGNOSIS — Z3183 Encounter for assisted reproductive fertility procedure cycle: Secondary | ICD-10-CM | POA: Diagnosis not present

## 2017-01-27 DIAGNOSIS — Z3183 Encounter for assisted reproductive fertility procedure cycle: Secondary | ICD-10-CM | POA: Diagnosis not present

## 2017-01-30 DIAGNOSIS — Z3183 Encounter for assisted reproductive fertility procedure cycle: Secondary | ICD-10-CM | POA: Diagnosis not present

## 2017-02-01 DIAGNOSIS — Z3183 Encounter for assisted reproductive fertility procedure cycle: Secondary | ICD-10-CM | POA: Diagnosis not present

## 2017-02-02 DIAGNOSIS — Z3183 Encounter for assisted reproductive fertility procedure cycle: Secondary | ICD-10-CM | POA: Diagnosis not present

## 2017-02-02 MED FILL — PROMETHAZINE 12.5 MG TABLET: 12.5 | 2 days supply | Qty: 10 | Fill #0

## 2017-02-02 MED FILL — OXYCODONE-ACETAMINOPHEN 5-3: 5-325 | 2 days supply | Qty: 10 | Fill #0

## 2017-02-04 DIAGNOSIS — Q512 Other doubling of uterus, unspecified: Secondary | ICD-10-CM | POA: Diagnosis not present

## 2017-02-04 DIAGNOSIS — Z3183 Encounter for assisted reproductive fertility procedure cycle: Secondary | ICD-10-CM | POA: Diagnosis not present

## 2017-02-09 DIAGNOSIS — Z3183 Encounter for assisted reproductive fertility procedure cycle: Secondary | ICD-10-CM | POA: Diagnosis not present

## 2017-02-24 DIAGNOSIS — Z3183 Encounter for assisted reproductive fertility procedure cycle: Secondary | ICD-10-CM | POA: Diagnosis not present

## 2017-03-01 MED FILL — ESTRADIOL 0.1 MG PATCH: 0.1 | 30 days supply | Qty: 8 | Fill #1

## 2017-03-07 NOTE — L&D Delivery Note (Signed)
Patient was C/C/+ and pushed for 3 hours 20 minutes (total) with epidural.     At 3 hours pt was exhausted and VE was offered- risks d/w pt and she accepted.  VE placed at +3 station and pulled 3 contractions alternating with just pushing with 2 pop offs.  VD  female infant, with <30 second shoulder dystocia relieved with wood's screw and suprapubic pressure.  Apgars 9,9, weight P.    The patient had a second degree episiotomy extended to a partial 3B third degree laceration repaired with 2-0 vicryl R.  Fundus was firm. EBL was expected amount. Placenta was delivered intact. Vagina was clear.  Delayed cord clamping done for 30 seconds while warming baby. Baby was evaluated by [facility] and cleared to go to [facility]. Baby was vigorous and doing skin to skin with mother.  Saumya Hukill A

## 2017-03-10 MED FILL — MEDROXYPROGESTERONE 10 MG T: 10 | 5 days supply | Qty: 5 | Fill #0

## 2017-05-08 DIAGNOSIS — Z3200 Encounter for pregnancy test, result unknown: Secondary | ICD-10-CM | POA: Diagnosis not present

## 2017-05-08 DIAGNOSIS — Z3201 Encounter for pregnancy test, result positive: Secondary | ICD-10-CM | POA: Diagnosis not present

## 2017-05-10 DIAGNOSIS — Z3201 Encounter for pregnancy test, result positive: Secondary | ICD-10-CM | POA: Diagnosis not present

## 2017-05-10 DIAGNOSIS — Z3200 Encounter for pregnancy test, result unknown: Secondary | ICD-10-CM | POA: Diagnosis not present

## 2017-05-17 DIAGNOSIS — Z3200 Encounter for pregnancy test, result unknown: Secondary | ICD-10-CM | POA: Diagnosis not present

## 2017-05-24 DIAGNOSIS — Z3200 Encounter for pregnancy test, result unknown: Secondary | ICD-10-CM | POA: Diagnosis not present

## 2017-06-07 DIAGNOSIS — O0900 Supervision of pregnancy with history of infertility, unspecified trimester: Secondary | ICD-10-CM | POA: Diagnosis not present

## 2017-07-03 DIAGNOSIS — Z01419 Encounter for gynecological examination (general) (routine) without abnormal findings: Secondary | ICD-10-CM | POA: Diagnosis not present

## 2017-07-03 DIAGNOSIS — Z3480 Encounter for supervision of other normal pregnancy, unspecified trimester: Secondary | ICD-10-CM | POA: Diagnosis not present

## 2017-07-03 DIAGNOSIS — Z124 Encounter for screening for malignant neoplasm of cervix: Secondary | ICD-10-CM | POA: Diagnosis not present

## 2017-07-03 DIAGNOSIS — O3680X9 Pregnancy with inconclusive fetal viability, other fetus: Secondary | ICD-10-CM | POA: Diagnosis not present

## 2017-07-03 DIAGNOSIS — Z3481 Encounter for supervision of other normal pregnancy, first trimester: Secondary | ICD-10-CM | POA: Diagnosis not present

## 2017-07-03 LAB — OB RESULTS CONSOLE ABO/RH: RH Type: POSITIVE

## 2017-07-03 LAB — OB RESULTS CONSOLE GC/CHLAMYDIA
Chlamydia: NEGATIVE
Gonorrhea: NEGATIVE

## 2017-07-03 LAB — OB RESULTS CONSOLE RUBELLA ANTIBODY, IGM: Rubella: IMMUNE

## 2017-07-03 LAB — OB RESULTS CONSOLE RPR: RPR: NONREACTIVE

## 2017-07-03 LAB — OB RESULTS CONSOLE HEPATITIS B SURFACE ANTIGEN: HEP B S AG: NEGATIVE

## 2017-07-03 LAB — OB RESULTS CONSOLE ANTIBODY SCREEN: Antibody Screen: NEGATIVE

## 2017-07-03 LAB — OB RESULTS CONSOLE HIV ANTIBODY (ROUTINE TESTING): HIV: NONREACTIVE

## 2017-08-24 DIAGNOSIS — Z363 Encounter for antenatal screening for malformations: Secondary | ICD-10-CM | POA: Diagnosis not present

## 2017-09-22 DIAGNOSIS — Z369 Encounter for antenatal screening, unspecified: Secondary | ICD-10-CM | POA: Diagnosis not present

## 2017-10-20 DIAGNOSIS — Z3480 Encounter for supervision of other normal pregnancy, unspecified trimester: Secondary | ICD-10-CM | POA: Diagnosis not present

## 2017-10-20 DIAGNOSIS — Z369 Encounter for antenatal screening, unspecified: Secondary | ICD-10-CM | POA: Diagnosis not present

## 2017-10-27 DIAGNOSIS — O99810 Abnormal glucose complicating pregnancy: Secondary | ICD-10-CM | POA: Diagnosis not present

## 2017-11-10 DIAGNOSIS — Z369 Encounter for antenatal screening, unspecified: Secondary | ICD-10-CM | POA: Diagnosis not present

## 2017-11-23 DIAGNOSIS — H5213 Myopia, bilateral: Secondary | ICD-10-CM | POA: Diagnosis not present

## 2017-11-23 DIAGNOSIS — Z23 Encounter for immunization: Secondary | ICD-10-CM | POA: Diagnosis not present

## 2017-11-23 DIAGNOSIS — H52203 Unspecified astigmatism, bilateral: Secondary | ICD-10-CM | POA: Diagnosis not present

## 2017-12-06 DIAGNOSIS — Z369 Encounter for antenatal screening, unspecified: Secondary | ICD-10-CM | POA: Diagnosis not present

## 2017-12-14 ENCOUNTER — Encounter (HOSPITAL_COMMUNITY): Payer: Self-pay | Admitting: *Deleted

## 2017-12-14 ENCOUNTER — Other Ambulatory Visit: Payer: Self-pay

## 2017-12-14 ENCOUNTER — Inpatient Hospital Stay (HOSPITAL_COMMUNITY)
Admission: AD | Admit: 2017-12-14 | Discharge: 2017-12-14 | Disposition: A | Discharge status: Home / Self Care | Payer: 59 | Source: Ambulatory Visit | Attending: Obstetrics and Gynecology | Admitting: Obstetrics and Gynecology

## 2017-12-14 DIAGNOSIS — O26899 Other specified pregnancy related conditions, unspecified trimester: Secondary | ICD-10-CM | POA: Diagnosis not present

## 2017-12-14 DIAGNOSIS — N898 Other specified noninflammatory disorders of vagina: Secondary | ICD-10-CM

## 2017-12-14 DIAGNOSIS — O26893 Other specified pregnancy related conditions, third trimester: Secondary | ICD-10-CM | POA: Diagnosis not present

## 2017-12-14 DIAGNOSIS — Z3A35 35 weeks gestation of pregnancy: Secondary | ICD-10-CM | POA: Insufficient documentation

## 2017-12-14 LAB — WET PREP, GENITAL
Clue Cells Wet Prep HPF POC: NONE SEEN
SPERM: NONE SEEN
TRICH WET PREP: NONE SEEN
Yeast Wet Prep HPF POC: NONE SEEN

## 2017-12-14 LAB — POCT FERN TEST: POCT Fern Test: NEGATIVE

## 2017-12-14 LAB — AMNISURE RUPTURE OF MEMBRANE (ROM) NOT AT ARMC: AMNISURE: NEGATIVE

## 2017-12-14 NOTE — MAU Note (Signed)
Was at work.  Went to the bathroom.  When walking out, started feeling pressure, then felt some leaking.  Pants were soaked.  Still coming

## 2017-12-14 NOTE — MAU Note (Signed)
Urine sent to lab 

## 2017-12-14 NOTE — MAU Provider Note (Signed)
History     CSN: [id number] Arrival date and time: 12/14/17 1247  Chief Complaint  Patient presents with  . Rupture of Membranes   HPI 32yo G2P0010 at [redacted]w[redacted]d who presents for evaluation of rupture of membranes. 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 Today she was leaving work, walking out to her car in the parking lot, and noticed some leakage of fluids. She states she noticed her underwear was completely soaked and her scrubs were wet. She has noticed a little bit of trickling since then. She had just used the bathroom so didn't think she had any urine leakage. She has felt more pelvic pressure over the last few weeks. Reports some vaginal discharge at baseline, usually wears a liner to help stay dry. Denies any vaginal bleeding. Reports normal fetal movement.   OB History    Gravida  2   Para      Term      Preterm      AB  1   Living        SAB  1   TAB      Ectopic      Multiple      Live Births              Past Medical History:  Diagnosis Date  . Medical history non-contributory     Past Surgical History:  Procedure Laterality Date  . APPENDECTOMY    . Grommet    . TONSILLECTOMY AND ADENOIDECTOMY  2009    Family History  Problem Relation Age of Onset  . Hypertension Father     Social History   Tobacco Use  . Smoking status: Never Smoker  . Smokeless tobacco: Never Used  Substance Use Topics  . Alcohol use: Not Currently  . Drug use: Never    Allergies:  Allergies  Allergen Reactions  . Sulfa Antibiotics Hives    Facility-Administered Medications Prior to Admission  Medication Dose Route Frequency Provider Last Rate Last Dose  . albuterol (PROVENTIL HFA;VENTOLIN HFA) 108 (90 Base) MCG/ACT inhaler 2 puff  2 puff Inhalation Q6H PRN Nahser, Wonda Cheng, MD       Medications Prior to Admission  Medication Sig Dispense Refill Last Dose  . acetaminophen (TYLENOL) 325 MG tablet Take 325 mg by mouth  every 6 (six) hours as needed.   Taking  . albuterol (PROVENTIL HFA;VENTOLIN HFA) 108 (90 Base) MCG/ACT inhaler Inhale 2 puffs into the lungs every 6 (six) hours as needed for wheezing or shortness of breath. 1 Inhaler 2   . azithromycin (ZITHROMAX) 250 MG tablet 2 po day 1 followed by one po days 2-5 6 tablet 0   . metaxalone (SKELAXIN) 800 MG tablet Take 1 tablet (800 mg total) by mouth 3 (three) times daily. 30 tablet 6 Taking  . Prenatal Vit-Fe Fumarate-FA (PRENATAL MULTIVITAMIN) TABS tablet Take 1 tablet by mouth daily at 12 noon.   Taking  . scopolamine (TRANSDERM-SCOP, 1.5 MG,) 1 MG/3DAYS Place 1 patch (1.5 mg total) onto the skin every 3 (three) days. 10 patch 0 Taking    Review of Systems  Constitutional: Negative for activity change and fever.  Genitourinary: Positive for pelvic pain (pressure ) and vaginal discharge. Negative for difficulty urinating, vaginal bleeding and vaginal pain.   Physical Exam   Blood pressure 134/80, pulse 94, temperature 98.4 F (36.9 C), temperature source Oral, resp. rate 18, height 5\' 7"  (1.702 m), weight  102.6 kg, SpO2 99 %, unknown if currently breastfeeding.  Physical Exam  Nursing note and vitals reviewed. Constitutional: She is oriented to person, place, and time. She appears well-developed and well-nourished. No distress.  HENT:  Head: Normocephalic and atraumatic.  Eyes: Conjunctivae and EOM are normal. No scleral icterus.  Cardiovascular: Normal rate.  Respiratory: Effort normal and breath sounds normal. She has no wheezes.  GI:  gravid  Genitourinary: Vaginal discharge (moderate amount thin, clear/white) found.  Genitourinary Comments: Normal appearing vaginal walls, normal-appearing cervix appears visually closed  Neurological: She is alert and oriented to person, place, and time.  Psychiatric: She has a normal mood and affect. Her behavior is normal.   MAU Course  Procedures  MDM -- speculum exam without pooling but moderate  amount of thin clear/white discharge, no pooling with valsalva -- wet rep with WBCs but no evidence of BV, yeast  amnisure negative  -- Reactive NST: 130s/mod/+a/-d; uterine irritability   Assessment and Plan  32yo G2P0010 at [redacted]w[redacted]d who presents for evaluation of rupture of membranes. Work-up not consistent with ROM - negative ferning, no pooling, and negative amnisure. SVE FT/thick/ballotable, cervix posterior. No signs of infection on wet prep. Reactive NST. Has next routine prenatal visit in 5 days. Discharged home with return precautions.   Glenice Bow, DO 12/14/2017, 1:18 PM

## 2017-12-14 NOTE — Discharge Instructions (Signed)
You were seen in the MAU for concern for breaking your water. There were no signs that your water broke. Your baby looked great on the monitor. Keep your regularly scheduled follow-up with your provider. Return to MAU if you have contractions, more leakage of fluid, vaginal bleeding or notice decreased fetal movement.

## 2017-12-19 DIAGNOSIS — Z3480 Encounter for supervision of other normal pregnancy, unspecified trimester: Secondary | ICD-10-CM | POA: Diagnosis not present

## 2017-12-19 LAB — OB RESULTS CONSOLE GBS: STREP GROUP B AG: NEGATIVE

## 2017-12-26 DIAGNOSIS — Z369 Encounter for antenatal screening, unspecified: Secondary | ICD-10-CM | POA: Diagnosis not present

## 2018-01-05 DIAGNOSIS — Z369 Encounter for antenatal screening, unspecified: Secondary | ICD-10-CM | POA: Diagnosis not present

## 2018-01-12 DIAGNOSIS — Z369 Encounter for antenatal screening, unspecified: Secondary | ICD-10-CM | POA: Diagnosis not present

## 2018-01-16 ENCOUNTER — Inpatient Hospital Stay (HOSPITAL_BASED_OUTPATIENT_CLINIC_OR_DEPARTMENT_OTHER): Payer: 59

## 2018-01-16 ENCOUNTER — Inpatient Hospital Stay (HOSPITAL_COMMUNITY)
Admission: AD | Admit: 2018-01-16 | Discharge: 2018-01-16 | Disposition: A | Discharge status: Home / Self Care | Payer: 59 | Source: Ambulatory Visit | Attending: Obstetrics and Gynecology | Admitting: Obstetrics and Gynecology

## 2018-01-16 ENCOUNTER — Encounter (HOSPITAL_COMMUNITY): Payer: Self-pay | Admitting: *Deleted

## 2018-01-16 DIAGNOSIS — O368130 Decreased fetal movements, third trimester, not applicable or unspecified: Secondary | ICD-10-CM

## 2018-01-16 DIAGNOSIS — O479 False labor, unspecified: Secondary | ICD-10-CM

## 2018-01-16 DIAGNOSIS — Z3483 Encounter for supervision of other normal pregnancy, third trimester: Secondary | ICD-10-CM | POA: Diagnosis not present

## 2018-01-16 DIAGNOSIS — M549 Dorsalgia, unspecified: Secondary | ICD-10-CM

## 2018-01-16 DIAGNOSIS — O26893 Other specified pregnancy related conditions, third trimester: Secondary | ICD-10-CM | POA: Insufficient documentation

## 2018-01-16 DIAGNOSIS — Z3A40 40 weeks gestation of pregnancy: Secondary | ICD-10-CM | POA: Insufficient documentation

## 2018-01-16 DIAGNOSIS — O368190 Decreased fetal movements, unspecified trimester, not applicable or unspecified: Secondary | ICD-10-CM

## 2018-01-16 LAB — COMPREHENSIVE METABOLIC PANEL
ALBUMIN: 3.3 g/dL — AB (ref 3.5–5.0)
ALK PHOS: 192 U/L — AB (ref 38–126)
ALT: 14 U/L (ref 0–44)
AST: 23 U/L (ref 15–41)
Anion gap: 10 (ref 5–15)
BUN: 7 mg/dL (ref 6–20)
CALCIUM: 8.5 mg/dL — AB (ref 8.9–10.3)
CHLORIDE: 99 mmol/L (ref 98–111)
CO2: 19 mmol/L — AB (ref 22–32)
CREATININE: 0.63 mg/dL (ref 0.44–1.00)
GFR calc Af Amer: >60 mL/min (ref >60)
GFR calc non Af Amer: >60 mL/min (ref >60)
GLUCOSE: 82 mg/dL (ref 70–99)
Potassium: 3.5 mmol/L (ref 3.5–5.1)
SODIUM: 128 mmol/L — AB (ref 135–145)
Total Bilirubin: 0.5 mg/dL (ref 0.3–1.2)
Total Protein: 6.9 g/dL (ref 6.5–8.1)

## 2018-01-16 LAB — CBC
HEMATOCRIT: 38.5 % (ref 36.0–46.0)
Hemoglobin: 12.5 g/dL (ref 12.0–15.0)
MCH: 29 pg (ref 26.0–34.0)
MCHC: 32.5 g/dL (ref 30.0–36.0)
MCV: 89.3 fL (ref 80.0–100.0)
PLATELETS: 179 10*3/uL (ref 150–400)
RBC: 4.31 MIL/uL (ref 3.87–5.11)
RDW: 14.2 % (ref 11.5–15.5)
WBC: 13 10*3/uL — ABNORMAL HIGH (ref 4.0–10.5)
nRBC: 0 % (ref 0.0–0.2)

## 2018-01-16 LAB — URINALYSIS, ROUTINE W REFLEX MICROSCOPIC
Bilirubin Urine: NEGATIVE
GLUCOSE, UA: NEGATIVE mg/dL
HGB URINE DIPSTICK: NEGATIVE
Ketones, ur: NEGATIVE mg/dL
Nitrite: NEGATIVE
Protein, ur: NEGATIVE mg/dL
SPECIFIC GRAVITY, URINE: 1.008 (ref 1.005–1.030)
pH: 7 (ref 5.0–8.0)

## 2018-01-16 LAB — PROTEIN / CREATININE RATIO, URINE
Creatinine, Urine: 54 mg/dL
Total Protein, Urine: <6 mg/dL

## 2018-01-16 NOTE — MAU Note (Addendum)
Pt states she has been having irregular uc's, today has had constant severe back pain, temp of 100 @ 1630.  Decreased fetal movement this evening.  Denies bleeding or LOF.

## 2018-01-16 NOTE — MAU Provider Note (Signed)
History    CSN: [id number] Arrival date and time: 01/16/18 1753 First Provider Initiated Contact with Patient 01/16/18 1850     Chief Complaint  Patient presents with  . Back Pain  . Decreased Fetal Movement   HPI Carla Gonzalez is a 32yo G2P0010 at [redacted]w[redacted]d who presents with back pain, decreased fetal movement, and elevated temperature at home. She states that she slept poorly last night and woke up around 4:30 complaining of increasing pain in her back which would radiate to the front of her belly. She states she couldn't get comfortable all day and has just felt off. She checked her temperature, and it was 100F. She feels like she gets pressure in her face occasionally when pain starts in her back. She has had pressure behind her eyes as well. Feels like swelling in legs is maybe a little worse, usually more on left than on right. She has been checking her blood pressure, and the highest it got at home was 601U systolic. States baby is usually very active and has been much more quiet today.   Denies any vaginal bleeding, leakage of fluids.   OB History    Gravida  2   Para      Term      Preterm      AB  1   Living        SAB  1   TAB      Ectopic      Multiple      Live Births              Past Medical History:  Diagnosis Date  . Medical history non-contributory     Past Surgical History:  Procedure Laterality Date  . APPENDECTOMY    . Grommet    . TONSILLECTOMY AND ADENOIDECTOMY  2009    Family History  Problem Relation Age of Onset  . Hypertension Father     Social History   Tobacco Use  . Smoking status: Never Smoker  . Smokeless tobacco: Never Used  Substance Use Topics  . Alcohol use: Not Currently  . Drug use: Never    Allergies:  Allergies  Allergen Reactions  . Sulfa Antibiotics Hives    Medications Prior to Admission  Medication Sig Dispense Refill Last Dose  . acetaminophen (TYLENOL) 325 MG tablet Take 325 mg by mouth every 6 (six)  hours as needed.   Taking  . albuterol (PROVENTIL HFA;VENTOLIN HFA) 108 (90 Base) MCG/ACT inhaler Inhale 2 puffs into the lungs every 6 (six) hours as needed for wheezing or shortness of breath. 1 Inhaler 2   . metaxalone (SKELAXIN) 800 MG tablet Take 1 tablet (800 mg total) by mouth 3 (three) times daily. 30 tablet 6 Taking  . Prenatal Vit-Fe Fumarate-FA (PRENATAL MULTIVITAMIN) TABS tablet Take 1 tablet by mouth daily at 12 noon.   Taking  . scopolamine (TRANSDERM-SCOP, 1.5 MG,) 1 MG/3DAYS Place 1 patch (1.5 mg total) onto the skin every 3 (three) days. 10 patch 0 Taking    Review of Systems  Constitutional: Positive for activity change and fever.  HENT: Positive for congestion.   Eyes: Positive for visual disturbance (stars).  Respiratory: Negative for shortness of breath.   Cardiovascular: Negative for chest pain.  Gastrointestinal: Positive for abdominal pain. Negative for diarrhea and nausea.  Genitourinary: Positive for pelvic pain. Negative for dysuria and flank pain.  Musculoskeletal: Positive for back pain.  Skin: Negative for rash.  Neurological: Positive for headaches.  Psychiatric/Behavioral: Positive  for sleep disturbance.   Physical Exam   Blood pressure 118/75, pulse 97, temperature 98.1 F (36.7 C), temperature source Oral, resp. rate 20, height 5\' 7"  (1.702 m), weight 104.3 kg, unknown if currently breastfeeding.  Physical Exam  Nursing note and vitals reviewed. Constitutional: She is oriented to person, place, and time. She appears well-developed and well-nourished. She appears distressed (flushed, uncomfortable during contractions).  HENT:  Head: Normocephalic and atraumatic.  Eyes: Conjunctivae and EOM are normal. No scleral icterus.  Cardiovascular: Normal rate.  Respiratory: No respiratory distress.  GI: There is no tenderness.  Gravid, soft  Musculoskeletal: She exhibits edema (trace non-pitting to mid shins).  Neurological: She is alert and oriented to  person, place, and time.  Skin: Skin is warm and dry. No rash noted.  Psychiatric: She has a normal mood and affect. Her behavior is normal.   MAU Course  Procedures  MDM -- Reactive NST, category I strip with contractions 130-140s/mod/+a/-d; contractions initially irregular now every 3-6 minutes  -- Will check BPP given decreased fetal movement at term -- Also check CMP, CBC, UPC given headaches, swelling, spots in vision - normal BP here  -- BPP 8/8 with AFI 14 -- AST/ALT, plt wnl - evidence of likely hypovolemic hyponatremia, UPC undetectable -- U/A with rare bacteria, trace leukocytes - urine culture sent  -- SVE 2/50/-3 unchanged after 2 hours then 3/50/-3 after 3 hours - mostly back labor - latent labor and stable for discharge home, reviewed return precautions   Assessment and Plan  32yo G2P0010 at [redacted]w[redacted]d who presented with back pain that has been worsening today, decreased fetal movement. Reassuring BPP 8/8 with reactive NST, category I strip. Labs checked because of feeling some headaches earlier in the day - all reassuring and wnl, blood pressures normal here. Labor precautions reviewed, patient lives about 30 minutes away. Stable for discharge, all questions answered.   Carla Bow, DO  01/16/2018, 8:43 PM

## 2018-01-16 NOTE — Discharge Instructions (Signed)
·   Return to MAU if you notice leakage of fluids or vaginal bleeding  Also return if your contractions increase in intensity, our lasting more than a minute and are every 3 minutes apart or less  Try getting on a yoga ball or sitting in a warm tub to help with contraction pain

## 2018-01-18 ENCOUNTER — Inpatient Hospital Stay (HOSPITAL_COMMUNITY): Payer: 59 | Admitting: Anesthesiology

## 2018-01-18 ENCOUNTER — Inpatient Hospital Stay (HOSPITAL_COMMUNITY)
Admission: AD | Admit: 2018-01-18 | Discharge: 2018-01-20 | DRG: 768 | Disposition: A | Discharge status: Home / Self Care | Payer: 59 | Attending: Obstetrics and Gynecology | Admitting: Obstetrics and Gynecology

## 2018-01-18 ENCOUNTER — Encounter (HOSPITAL_COMMUNITY): Payer: Self-pay | Admitting: *Deleted

## 2018-01-18 ENCOUNTER — Other Ambulatory Visit: Payer: Self-pay

## 2018-01-18 DIAGNOSIS — Z3483 Encounter for supervision of other normal pregnancy, third trimester: Secondary | ICD-10-CM | POA: Diagnosis present

## 2018-01-18 DIAGNOSIS — Z3A40 40 weeks gestation of pregnancy: Secondary | ICD-10-CM | POA: Diagnosis not present

## 2018-01-18 DIAGNOSIS — O411290 Chorioamnionitis, unspecified trimester, not applicable or unspecified: Secondary | ICD-10-CM | POA: Diagnosis not present

## 2018-01-18 DIAGNOSIS — Z3A00 Weeks of gestation of pregnancy not specified: Secondary | ICD-10-CM | POA: Diagnosis not present

## 2018-01-18 LAB — CULTURE, OB URINE: Culture: NO GROWTH

## 2018-01-18 LAB — CBC
HCT: 38.4 % (ref 36.0–46.0)
Hemoglobin: 12.7 g/dL (ref 12.0–15.0)
MCH: 29.2 pg (ref 26.0–34.0)
MCHC: 33.1 g/dL (ref 30.0–36.0)
MCV: 88.3 fL (ref 80.0–100.0)
PLATELETS: 162 10*3/uL (ref 150–400)
RBC: 4.35 MIL/uL (ref 3.87–5.11)
RDW: 14.5 % (ref 11.5–15.5)
WBC: 11.3 10*3/uL — ABNORMAL HIGH (ref 4.0–10.5)
nRBC: 0 % (ref 0.0–0.2)

## 2018-01-18 LAB — INFLUENZA PANEL BY PCR (TYPE A & B)
INFLAPCR: NEGATIVE
INFLBPCR: NEGATIVE

## 2018-01-18 LAB — TYPE AND SCREEN
ABO/RH(D): O POS
Antibody Screen: NEGATIVE

## 2018-01-18 MED ORDER — OXYTOCIN 40 UNITS IN LACTATED RINGERS INFUSION - SIMPLE MED
1.0000 m[IU]/min | INTRAVENOUS | Status: DC
  Administered 2018-01-18: 1 m[IU]/min via INTRAVENOUS

## 2018-01-18 MED ORDER — EPHEDRINE 5 MG/ML INJ
10.0000 mg | INTRAVENOUS | Status: DC | PRN
  Filled 2018-01-18: qty 2

## 2018-01-18 MED ORDER — PHENYLEPHRINE 40 MCG/ML (10ML) SYRINGE FOR IV PUSH (FOR BLOOD PRESSURE SUPPORT)
80.0000 ug | PREFILLED_SYRINGE | INTRAVENOUS | Status: DC | PRN
  Filled 2018-01-18: qty 5

## 2018-01-18 MED ORDER — LIDOCAINE HCL (PF) 1 % IJ SOLN
INTRAMUSCULAR | Status: DC | PRN
  Administered 2018-01-18: 8 mL via EPIDURAL

## 2018-01-18 MED ORDER — ONDANSETRON HCL 4 MG/2ML IJ SOLN: 4.0000 mg | Freq: Four times a day (QID) | INTRAMUSCULAR | Status: DC | PRN

## 2018-01-18 MED ORDER — OXYTOCIN 40 UNITS IN LACTATED RINGERS INFUSION - SIMPLE MED
INTRAVENOUS | Status: AC
  Filled 2018-01-18: qty 1000

## 2018-01-18 MED ORDER — ACETAMINOPHEN 325 MG PO TABS
650.0000 mg | ORAL_TABLET | ORAL | Status: DC | PRN
  Administered 2018-01-18 – 2018-01-19 (×4): 650 mg via ORAL
  Filled 2018-01-18 (×4): qty 2

## 2018-01-18 MED ORDER — FENTANYL 2.5 MCG/ML BUPIVACAINE 1/10 % EPIDURAL INFUSION (WH - ANES)
14.0000 mL/h | INTRAMUSCULAR | Status: DC | PRN
  Administered 2018-01-18 – 2018-01-19 (×5): 14 mL/h via EPIDURAL
  Filled 2018-01-18 (×5): qty 100

## 2018-01-18 MED ORDER — LACTATED RINGERS IV SOLN
INTRAVENOUS | Status: DC
  Administered 2018-01-18 – 2018-01-19 (×4): via INTRAVENOUS

## 2018-01-18 MED ORDER — LACTATED RINGERS IV SOLN
500.0000 mL | Freq: Once | INTRAVENOUS | Status: AC
  Administered 2018-01-18: 500 mL via INTRAVENOUS

## 2018-01-18 MED ORDER — TERBUTALINE SULFATE 1 MG/ML IJ SOLN
0.2500 mg | Freq: Once | INTRAMUSCULAR | Status: DC | PRN
  Filled 2018-01-18: qty 1

## 2018-01-18 MED ORDER — SODIUM CHLORIDE 0.9 % IV SOLN
2.0000 g | Freq: Four times a day (QID) | INTRAVENOUS | Status: DC
  Administered 2018-01-18 – 2018-01-19 (×3): 2 g via INTRAVENOUS
  Filled 2018-01-18: qty 2000
  Filled 2018-01-18: qty 2
  Filled 2018-01-18: qty 2000
  Filled 2018-01-18 (×2): qty 2

## 2018-01-18 MED ORDER — FLEET ENEMA 7-19 GM/118ML RE ENEM: 1.0000 | ENEMA | RECTAL | Status: DC | PRN

## 2018-01-18 MED ORDER — OXYCODONE-ACETAMINOPHEN 5-325 MG PO TABS: 1.0000 | ORAL_TABLET | ORAL | Status: DC | PRN

## 2018-01-18 MED ORDER — OXYTOCIN 40 UNITS IN LACTATED RINGERS INFUSION - SIMPLE MED: 2.5000 [IU]/h | INTRAVENOUS | Status: DC

## 2018-01-18 MED ORDER — SOD CITRATE-CITRIC ACID 500-334 MG/5ML PO SOLN
30.0000 mL | ORAL | Status: DC | PRN
  Filled 2018-01-18: qty 15

## 2018-01-18 MED ORDER — OXYCODONE-ACETAMINOPHEN 5-325 MG PO TABS: 2.0000 | ORAL_TABLET | ORAL | Status: DC | PRN

## 2018-01-18 MED ORDER — LIDOCAINE HCL (PF) 1 % IJ SOLN
30.0000 mL | INTRAMUSCULAR | Status: AC | PRN
  Administered 2018-01-19 (×2): 30 mL via SUBCUTANEOUS
  Filled 2018-01-18 (×2): qty 30

## 2018-01-18 MED ORDER — OXYTOCIN BOLUS FROM INFUSION
500.0000 mL | Freq: Once | INTRAVENOUS | Status: AC
  Administered 2018-01-19: 500 mL via INTRAVENOUS

## 2018-01-18 MED ORDER — DIPHENHYDRAMINE HCL 50 MG/ML IJ SOLN
12.5000 mg | INTRAMUSCULAR | Status: DC | PRN
  Administered 2018-01-18 (×2): 12.5 mg via INTRAVENOUS
  Filled 2018-01-18: qty 1

## 2018-01-18 MED ORDER — LACTATED RINGERS IV SOLN
500.0000 mL | INTRAVENOUS | Status: DC | PRN
  Administered 2018-01-18: 500 mL via INTRAVENOUS

## 2018-01-18 MED ORDER — DIPHENHYDRAMINE HCL 50 MG/ML IJ SOLN: 25.0000 mg | Freq: Once | INTRAMUSCULAR | Status: DC

## 2018-01-18 MED ORDER — PHENYLEPHRINE 40 MCG/ML (10ML) SYRINGE FOR IV PUSH (FOR BLOOD PRESSURE SUPPORT)
80.0000 ug | PREFILLED_SYRINGE | INTRAVENOUS | Status: DC | PRN
  Filled 2018-01-18: qty 5
  Filled 2018-01-18 (×2): qty 10

## 2018-01-18 NOTE — Progress Notes (Signed)
Small amt of hives noted close to iv site.  Pt denies itching, states she "just noticed them there".  IV site without edema, redness, or warmth. Charge RN at bedside to assess.  Benadryl given, will con't to monitor.

## 2018-01-18 NOTE — Progress Notes (Signed)
Pt comfortable with epidural.  Feeling less hot.  Vitals:   01/18/18 2001 01/18/18 2005 01/18/18 2031 01/18/18 2101  BP:  (!) 106/59 (!) 114/59 (!) 103/50  Pulse:  (!) 129 (!) 118 (!) 123  Resp:      Temp: (!) 101.7 F (38.7 C)     TempSrc: Axillary     SpO2:      Weight:      Height:       FHTs were as high as 170s with fever but now down to 150-160s with gSTV and NST R.No decels, cat 1 tracing. Toco q 3-4, more regular now. SVE 8/C/-3, slight edema anteriorly but otherwise normal.  Bloody show.  IUPC placed and fluid flash back was clear.  A/P Labor.  Pitocin started earlier to get a better contraction pattern.  Temp was as high as 103 and has come down with tylenol and cooling.  Pt on amoxicillin but pt reports that she had a fever 3 nights ago.  She denies URI sx but we are running rapid strep and flu tests.  If pt has a UTI/pyelo, she is being treated with the ampicillin.

## 2018-01-18 NOTE — MAU Note (Signed)
PT SAYS  AT 0230- UC'S STRONG .    WAS HERE  LAST NIGHT - 3  CM.      DENIES  HSV AND MRSA .  GBS- NEG

## 2018-01-18 NOTE — Anesthesia Procedure Notes (Signed)
Epidural Patient location during procedure: OB Start time: 01/18/2018 8:25 AM End time: 01/18/2018 8:35 AM  Staffing Anesthesiologist: Janeece Riggers, MD  Preanesthetic Checklist Completed: patient identified, site marked, surgical consent, pre-op evaluation, timeout performed, IV checked, risks and benefits discussed and monitors and equipment checked  Epidural Patient position: sitting Prep: site prepped and draped and DuraPrep Patient monitoring: continuous pulse ox and blood pressure Approach: midline Location: L4-L5 Injection technique: LOR air  Needle:  Needle type: Tuohy  Needle gauge: 17 G Needle length: 9 cm and 9 Needle insertion depth: 6 cm Catheter type: closed end flexible Catheter size: 19 Gauge Catheter at skin depth: 11 cm Test dose: negative  Assessment Events: blood not aspirated, injection not painful, no injection resistance, negative IV test and no paresthesia

## 2018-01-18 NOTE — Anesthesia Preprocedure Evaluation (Signed)
Anesthesia Evaluation  Patient identified by MRN, date of birth, ID band Patient awake    Reviewed: Allergy & Precautions, H&P , NPO status , Patient's Chart, lab work & pertinent test results, reviewed documented beta blocker date and time   Airway Mallampati: II  TM Distance: >3 FB Neck ROM: full    Dental no notable dental hx.    Pulmonary neg pulmonary ROS,    Pulmonary exam normal breath sounds clear to auscultation       Cardiovascular negative cardio ROS Normal cardiovascular exam Rhythm:regular Rate:Normal     Neuro/Psych negative neurological ROS  negative psych ROS   GI/Hepatic negative GI ROS, Neg liver ROS,   Endo/Other  negative endocrine ROS  Renal/GU negative Renal ROS  negative genitourinary   Musculoskeletal   Abdominal   Peds  Hematology negative hematology ROS (+)   Anesthesia Other Findings   Reproductive/Obstetrics (+) Pregnancy                             Anesthesia Physical Anesthesia Plan  ASA: II  Anesthesia Plan: Epidural   Post-op Pain Management:    Induction:   PONV Risk Score and Plan:   Airway Management Planned:   Additional Equipment:   Intra-op Plan:   Post-operative Plan:   Informed Consent: I have reviewed the patients History and Physical, chart, labs and discussed the procedure including the risks, benefits and alternatives for the proposed anesthesia with the patient or authorized representative who has indicated his/her understanding and acceptance.     Dental Advisory Given  Plan Discussed with: CRNA, Anesthesiologist and Surgeon  Anesthesia Plan Comments: (Labs checked- platelets confirmed with RN in room. Fetal heart tracing, per RN, reported to be stable enough for sitting procedure. Discussed epidural, and patient consents to the procedure:  included risk of possible headache,backache, failed block, allergic reaction, and  nerve injury. This patient was asked if she had any questions or concerns before the procedure started.)        Anesthesia Quick Evaluation  

## 2018-01-18 NOTE — Progress Notes (Signed)
Dr Philis Pique called re: SVE with few clots none bigger than pea-sized; edematous cervix anteriorly; maternal & fetal tachycardia with reactive FHR; most recent temp.  Orders rec'd to proceed with scheduled tylenol at 1800 & continue to monitor bleeding.

## 2018-01-18 NOTE — Anesthesia Pain Management Evaluation Note (Signed)
  CRNA Pain Management Visit Note  Patient: Carla Gonzalez, 32 y.o., female  "Hello I am a member of the anesthesia team at [facility]. We have an anesthesia team available at all times to provide care throughout the hospital, including epidural management and anesthesia for C-section. I don't know your plan for the delivery whether it a natural birth, water birth, IV sedation, nitrous supplementation, doula or epidural, but we want to meet your pain goals."   1.Was your pain managed to your expectations on prior hospitalizations?   Yes   2.What is your expectation for pain management during this hospitalization?     Epidural  3.How can we help you reach that goal? epidural  Record the patient's initial score and the patient's pain goal.   Pain: 7  Pain Goal: 8 [facility] wants you to be able to say your pain was always managed very well.  Bufford Spikes 01/18/2018

## 2018-01-18 NOTE — H&P (Signed)
32 y.o. [redacted]w[redacted]d  G2P0010 comes in c/o labor.  Otherwise has good fetal movement and no bleeding.  Past Medical History:  Diagnosis Date  . Medical history non-contributory     Past Surgical History:  Procedure Laterality Date  . APPENDECTOMY    . Grommet    . TONSILLECTOMY AND ADENOIDECTOMY  2009    OB History  Gravida Para Term Preterm AB Living  2       1    SAB TAB Ectopic Multiple Live Births  1            # Outcome Date GA Lbr Len/2nd Weight Sex Delivery Anes PTL Lv  2 Current           1 SAB             Social History   Socioeconomic History  . Marital status: Married    Spouse name: Not on file  . Number of children: Not on file  . Years of education: Not on file  . Highest education level: Not on file  Occupational History  . Not on file  Social Needs  . Financial resource strain: Not on file  . Food insecurity:    Worry: Not on file    Inability: Not on file  . Transportation needs:    Medical: Not on file    Non-medical: Not on file  Tobacco Use  . Smoking status: Never Smoker  . Smokeless tobacco: Never Used  Substance and Sexual Activity  . Alcohol use: Not Currently  . Drug use: Never  . Sexual activity: Yes  Lifestyle  . Physical activity:    Days per week: Not on file    Minutes per session: Not on file  . Stress: Not on file  Relationships  . Social connections:    Talks on phone: Not on file    Gets together: Not on file    Attends religious service: Not on file    Active member of club or organization: Not on file    Attends meetings of clubs or organizations: Not on file    Relationship status: Not on file  . Intimate partner violence:    Fear of current or ex partner: Not on file    Emotionally abused: Not on file    Physically abused: Not on file    Forced sexual activity: Not on file  Other Topics Concern  . Not on file  Social History Narrative  . Not on file   Sulfa antibiotics    Prenatal Transfer Tool  Maternal Diabetes:  No Genetic Screening: Normal Maternal Ultrasounds/Referrals: Normal Fetal Ultrasounds or other Referrals:  None Maternal Substance Abuse:  No Significant Maternal Medications:  None Significant Maternal Lab Results: None except pt carries fragile x chromosome, fetus is female  Other PNC: uncomplicated.    Vitals:   01/18/18 0551 01/18/18 0637 01/18/18 0638 01/18/18 0650  BP: 121/68   122/84  Pulse: (!) 117   (!) 105  Resp: (!) 22  18   Temp: 98.6 F (37 C)  99.5 F (37.5 C)   TempSrc: Oral     Weight:  103.9 kg    Height:  5\' 7"  (1.702 m)      Lungs/Cor:  NAD Abdomen:  soft, gravid Ex:  no cords, erythema SVE:  4.5/80/-2 per nurse FHTs:  140s, good STV, NST R; Cat 1 tracing. Toco:  q 5-10   A/P   Term labor.  GBS neg.  Fayrene Towner A

## 2018-01-19 ENCOUNTER — Encounter (HOSPITAL_COMMUNITY): Payer: Self-pay

## 2018-01-19 ENCOUNTER — Encounter (HOSPITAL_COMMUNITY)
Admission: AD | Disposition: A | Discharge status: Home / Self Care | Payer: Self-pay | Source: Home / Self Care | Attending: Obstetrics and Gynecology

## 2018-01-19 LAB — COMPREHENSIVE METABOLIC PANEL
ALBUMIN: 3.6 g/dL (ref 3.5–5.0)
ALT: 13 U/L (ref 0–44)
ANION GAP: 13 (ref 5–15)
AST: 25 U/L (ref 15–41)
Alkaline Phosphatase: 259 U/L — ABNORMAL HIGH (ref 38–126)
BUN: 10 mg/dL (ref 6–20)
CO2: 19 mmol/L — AB (ref 22–32)
Calcium: 9.1 mg/dL (ref 8.9–10.3)
Chloride: 103 mmol/L (ref 98–111)
Creatinine, Ser: 0.68 mg/dL (ref 0.44–1.00)
GFR calc Af Amer: >60 mL/min (ref >60)
GFR calc non Af Amer: >60 mL/min (ref >60)
GLUCOSE: 85 mg/dL (ref 70–99)
POTASSIUM: 3.8 mmol/L (ref 3.5–5.1)
SODIUM: 135 mmol/L (ref 135–145)
Total Bilirubin: 0.5 mg/dL (ref 0.3–1.2)
Total Protein: 7.5 g/dL (ref 6.5–8.1)

## 2018-01-19 LAB — CBC
HEMATOCRIT: 36.3 % (ref 36.0–46.0)
HEMOGLOBIN: 11.9 g/dL — AB (ref 12.0–15.0)
MCH: 28.9 pg (ref 26.0–34.0)
MCHC: 32.8 g/dL (ref 30.0–36.0)
MCV: 88.1 fL (ref 80.0–100.0)
PLATELETS: 158 10*3/uL (ref 150–400)
RBC: 4.12 MIL/uL (ref 3.87–5.11)
RDW: 15 % (ref 11.5–15.5)
WBC: 23 10*3/uL — ABNORMAL HIGH (ref 4.0–10.5)

## 2018-01-19 LAB — GROUP A STREP BY PCR: Group A Strep by PCR: NOT DETECTED

## 2018-01-19 LAB — RPR: RPR Ser Ql: NONREACTIVE

## 2018-01-19 SURGERY — Surgical Case
Anesthesia: Regional

## 2018-01-19 MED ORDER — WITCH HAZEL-GLYCERIN EX PADS: 1.0000 "application " | MEDICATED_PAD | CUTANEOUS | Status: DC | PRN

## 2018-01-19 MED ORDER — ONDANSETRON HCL 4 MG PO TABS: 4.0000 mg | ORAL_TABLET | ORAL | Status: DC | PRN

## 2018-01-19 MED ORDER — IBUPROFEN 800 MG PO TABS
800.0000 mg | ORAL_TABLET | Freq: Three times a day (TID) | ORAL | Status: DC
  Administered 2018-01-19 – 2018-01-20 (×4): 800 mg via ORAL
  Filled 2018-01-19 (×4): qty 1

## 2018-01-19 MED ORDER — SODIUM CHLORIDE 0.9% FLUSH: 3.0000 mL | INTRAVENOUS | Status: DC | PRN

## 2018-01-19 MED ORDER — OXYTOCIN 40 UNITS IN LACTATED RINGERS INFUSION - SIMPLE MED: 1.0000 m[IU]/min | INTRAVENOUS | Status: DC

## 2018-01-19 MED ORDER — OXYCODONE-ACETAMINOPHEN 5-325 MG PO TABS: 2.0000 | ORAL_TABLET | ORAL | Status: DC | PRN

## 2018-01-19 MED ORDER — TETANUS-DIPHTH-ACELL PERTUSSIS 5-2.5-18.5 LF-MCG/0.5 IM SUSP: 0.5000 mL | Freq: Once | INTRAMUSCULAR | Status: DC

## 2018-01-19 MED ORDER — MAGNESIUM HYDROXIDE 400 MG/5ML PO SUSP: 30.0000 mL | ORAL | Status: DC | PRN

## 2018-01-19 MED ORDER — BENZOCAINE-MENTHOL 20-0.5 % EX AERO
1.0000 "application " | INHALATION_SPRAY | CUTANEOUS | Status: DC | PRN
  Administered 2018-01-19 – 2018-01-20 (×2): 1 via TOPICAL
  Filled 2018-01-19 (×2): qty 56

## 2018-01-19 MED ORDER — SENNOSIDES-DOCUSATE SODIUM 8.6-50 MG PO TABS
2.0000 | ORAL_TABLET | ORAL | Status: DC
  Administered 2018-01-19: 2 via ORAL
  Filled 2018-01-19: qty 2

## 2018-01-19 MED ORDER — FERROUS SULFATE 325 (65 FE) MG PO TABS
325.0000 mg | ORAL_TABLET | Freq: Two times a day (BID) | ORAL | Status: DC
  Administered 2018-01-20: 325 mg via ORAL
  Filled 2018-01-19 (×2): qty 1

## 2018-01-19 MED ORDER — DIPHENHYDRAMINE HCL 25 MG PO CAPS: 25.0000 mg | ORAL_CAPSULE | Freq: Four times a day (QID) | ORAL | Status: DC | PRN

## 2018-01-19 MED ORDER — METHYLERGONOVINE MALEATE 0.2 MG/ML IJ SOLN: 0.2000 mg | INTRAMUSCULAR | Status: DC | PRN

## 2018-01-19 MED ORDER — COCONUT OIL OIL: 1.0000 "application " | TOPICAL_OIL | Status: DC | PRN

## 2018-01-19 MED ORDER — DIBUCAINE 1 % RE OINT: 1.0000 "application " | TOPICAL_OINTMENT | RECTAL | Status: DC | PRN

## 2018-01-19 MED ORDER — ONDANSETRON HCL 4 MG/2ML IJ SOLN: 4.0000 mg | INTRAMUSCULAR | Status: DC | PRN

## 2018-01-19 MED ORDER — ACETAMINOPHEN 325 MG PO TABS
650.0000 mg | ORAL_TABLET | ORAL | Status: DC | PRN
  Administered 2018-01-19: 650 mg via ORAL

## 2018-01-19 MED ORDER — SIMETHICONE 80 MG PO CHEW: 80.0000 mg | CHEWABLE_TABLET | ORAL | Status: DC | PRN

## 2018-01-19 MED ORDER — METHYLERGONOVINE MALEATE 0.2 MG PO TABS: 0.2000 mg | ORAL_TABLET | ORAL | Status: DC | PRN

## 2018-01-19 MED ORDER — SODIUM CHLORIDE 0.9 % IV SOLN: 250.0000 mL | INTRAVENOUS | Status: DC | PRN

## 2018-01-19 MED ORDER — SODIUM CHLORIDE 0.9% FLUSH: 3.0000 mL | Freq: Two times a day (BID) | INTRAVENOUS | Status: DC

## 2018-01-19 MED ORDER — PRENATAL MULTIVITAMIN CH
1.0000 | ORAL_TABLET | Freq: Every day | ORAL | Status: DC
  Administered 2018-01-19 – 2018-01-20 (×2): 1 via ORAL
  Filled 2018-01-19 (×2): qty 1

## 2018-01-19 MED ORDER — MEASLES, MUMPS & RUBELLA VAC IJ SOLR
0.5000 mL | Freq: Once | INTRAMUSCULAR | Status: DC
  Filled 2018-01-19: qty 0.5

## 2018-01-19 MED ORDER — ZOLPIDEM TARTRATE 5 MG PO TABS: 5.0000 mg | ORAL_TABLET | Freq: Every evening | ORAL | Status: DC | PRN

## 2018-01-19 NOTE — Lactation Note (Addendum)
This note was copied from a baby's chart. Lactation Consultation Note  Patient Name: Girl Neytiri Asche [id number] Date: 01/19/2018   g2p1 vaginal delivery vacuum assist with pitocin induction and epidural.  Infant LGA 70 hours old with blood sugars wnl . Mom attempting to breastfeed on arrival.  Infant just wants to get on the nipple.  Tried to assist mom and get infant on deeper infant gets upset. After a few attempts mom able to get infant latched deeper and not just on the nipple. Mom has large breasts and large nipples and areolar tissue is edematous but compressable.  Parents report took parenting classes at [facility] and learned some about breastfeeding. Reviewed Understanding mommy and me.  Gave breastfeeding Resource list and Breastfeeding Consultation Guide and encouraged to come to breastfeeding support group. Left mom and baby breastfeeding.  Urged mom to call lactation as needed and will follow up PRN.  .  Maternal Data    Feeding Feeding Type: Breast Fed  [facility] Score                   Interventions    Lactation Tools Discussed/Used     Consult Status      Rosalyn Archambault Thompson Caul 01/19/2018, 7:35 PM

## 2018-01-19 NOTE — Anesthesia Postprocedure Evaluation (Signed)
Anesthesia Post Note  Patient: Carla Gonzalez  Procedure(s) Performed: AN AD [place]     Patient location during evaluation: Mother Baby Anesthesia Type: Epidural Level of consciousness: awake, awake and alert and oriented Pain management: pain level controlled Vital Signs Assessment: post-procedure vital signs reviewed and stable Respiratory status: spontaneous breathing, nonlabored ventilation and respiratory function stable Cardiovascular status: stable Postop Assessment: no headache, no backache, adequate PO intake, able to ambulate, patient able to bend at knees and no apparent nausea or vomiting Anesthetic complications: no    Last Vitals:  Vitals:   01/19/18 0616 01/19/18 0632  BP: 115/79 106/72  Pulse: 81 89  Resp:    Temp:    SpO2:      Last Pain:  Vitals:   01/19/18 0401  TempSrc: Axillary  PainSc:    Pain Goal:                 Jenavieve Freda

## 2018-01-19 NOTE — Progress Notes (Signed)
Pt feeling pain on R despite being redosed with epidural.  Vitals:   01/19/18 0001 01/19/18 0031 01/19/18 0039 01/19/18 0102  BP: (!) 109/58 (!) 109/55    Pulse: (!) 111 (!) 108    Resp: 17     Temp: (!) 100.9 F (38.3 C)  100.2 F (37.9 C) (!) 100.7 F (38.2 C)  TempSrc: Axillary  Axillary Axillary  SpO2:      Weight:      Height:       FHTs 150s, gSTV, NST R, Cat 1 with earlies Toco q 3-4 SVE now complete/100/0  Commence pushing.  CMP and CBC drawn since pt still has low urine output.   Stevan Eberwein A

## 2018-01-20 LAB — CBC
HCT: 31.4 % — ABNORMAL LOW (ref 36.0–46.0)
Hemoglobin: 10.2 g/dL — ABNORMAL LOW (ref 12.0–15.0)
MCH: 28.8 pg (ref 26.0–34.0)
MCHC: 32.5 g/dL (ref 30.0–36.0)
MCV: 88.7 fL (ref 80.0–100.0)
NRBC: 0 % (ref 0.0–0.2)
PLATELETS: 157 10*3/uL (ref 150–400)
RBC: 3.54 MIL/uL — AB (ref 3.87–5.11)
RDW: 14.6 % (ref 11.5–15.5)
WBC: 17.9 10*3/uL — ABNORMAL HIGH (ref 4.0–10.5)

## 2018-01-20 MED ORDER — IBUPROFEN 800 MG PO TABS: 800.0000 mg | ORAL_TABLET | Freq: Three times a day (TID) | ORAL | 0 refills | Status: DC

## 2018-01-20 NOTE — Lactation Note (Signed)
This note was copied from a baby's chart. Lactation Consultation Note: Mother reports that infant just finished a 23 min feeding. Mother denies having any discomfort when infant is feeding. She reports that infant is nursing well. Mother also denies having any concerns or questions.   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 She request the Medela Back pack pump. Copy of insurance card made and returned to patient. Mother was given her pump as well as a harmony hand pump. Instructions on use and cleaning of hand pump. #24 flange fit well.  Discussed cluster feeding, cue base feeding second night of life and beyond. Advised parents to do STS frequently.  Advised to feed infant at least 8-12 times in 24 hours.   Discussed treatment and prevention of engorgement.  Mother informed of available [place] services, BFSG'S, OP dept services.  Mother aware of 24/7 [facility] phone line.  Mother to follow up as needed for breastfeeding concerns or questions.   Patient Name: Carla Gonzalez [id number] Date: 01/20/2018 Reason for consult: Follow-up assessment   Maternal Data    Feeding Feeding Type: Breast Fed  LATCH Score                   Interventions Interventions: Hand pump  Lactation Tools Discussed/Used     Consult Status Consult Status: Complete    Darla Lesches 01/20/2018, 10:27 AM

## 2018-01-20 NOTE — Discharge Summary (Signed)
Obstetric Discharge Summary Reason for Admission: onset of labor Prenatal Procedures: ultrasound Intrapartum Procedures: vacuum Postpartum Procedures: none Complications-Operative and Postpartum: 3rd degree perineal laceration Hemoglobin  Date Value Ref Range Status  01/20/2018 10.2 (L) 12.0 - 15.0 g/dL Final   HCT  Date Value Ref Range Status  01/20/2018 31.4 (L) 36.0 - 46.0 % Final    Physical Exam:  General: alert and cooperative Lochia: appropriate Discharge Diagnoses: Term Pregnancy-delivered  Discharge Information: Date: 01/20/2018 Activity: pelvic rest Diet: routine Medications: PNV and Ibuprofen Condition: stable Instructions: refer to practice specific booklet Discharge to: home Follow-up Information    [name], MD. Schedule an appointment as soon as possible for a visit in 1 month(s).   Specialty:  Obstetrics and Gynecology Contact information: [address] [place] [address] [phone]           Newborn Data: Live born female  Birth Weight: 9 lb 7 oz (4281 g) APGAR: 9, 9  Newborn Delivery   Time head delivered:  01/19/2018 04:42:00 Birth date/time:  01/19/2018 04:42:00 Delivery type:  Vaginal, Vacuum (Extractor)     Home with mother.  Carla Gonzalez 01/20/2018, 11:06 AM

## 2018-01-20 NOTE — Progress Notes (Signed)
PPD#1 Pt doing well. Lochia mild VSSAF Labs ok Imp/ Stable Plan/ will discharge

## 2018-04-27 ENCOUNTER — Ambulatory Visit: Payer: 59 | Admitting: Internal Medicine

## 2018-06-25 ENCOUNTER — Ambulatory Visit (INDEPENDENT_AMBULATORY_CARE_PROVIDER_SITE_OTHER): Payer: 59 | Admitting: Internal Medicine

## 2018-06-25 ENCOUNTER — Telehealth: Payer: Self-pay | Admitting: Internal Medicine

## 2018-06-25 ENCOUNTER — Encounter: Payer: Self-pay | Admitting: Internal Medicine

## 2018-06-25 ENCOUNTER — Other Ambulatory Visit: Payer: Self-pay

## 2018-06-25 DIAGNOSIS — H6692 Otitis media, unspecified, left ear: Secondary | ICD-10-CM | POA: Diagnosis not present

## 2018-06-25 MED ORDER — AMOXICILLIN 500 MG PO CAPS: 500.0000 mg | ORAL_CAPSULE | Freq: Three times a day (TID) | ORAL | 0 refills | Status: DC

## 2018-06-25 MED FILL — AMOXICILLIN 500 MG CAPSULE: 500 | 10 days supply | Qty: 30 | Fill #0

## 2018-06-25 NOTE — Telephone Encounter (Signed)
Carla Gonzalez [phone]  [name] Comment called to say she has left ear pain that is radiating down into her jaw, fever has been running 99.9, and she also feels very tired.

## 2018-06-25 NOTE — Telephone Encounter (Signed)
Please schedule virtual visit

## 2018-06-25 NOTE — Patient Instructions (Signed)
Amoxicillin 500 mg 3 times a day for 10 days.  Call if not improving in 48 hours or sooner if worse.

## 2018-06-25 NOTE — Progress Notes (Signed)
   Subjective:    Patient ID: Carla Gonzalez, female    DOB: September 09, 1985, 33 y.o.   MRN: [id number]  HPI 33 year old Female with complaint of ear pain and low-grade fever.  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  Seen today by interactive audio and video telecommunications due to coronavirus pandemic.  She agrees to visit in this format.  She is identified as Carla Gonzalez. Del[place]ach, a patient in this practice using 2 identifiers  Had grommet tubes as a child.  Allergic to Sulfa.  Does not smoke or consume alcohol. Delivered term infant in November 2019.  Not breast-feeding.  Review of Systems     Objective:   Physical Exam Reports low-grade fever and pain in left ear radiating to left jaw.  She does not think she has TMJ syndrome.  Feels that this is an otitis media.       Assessment & Plan:  I would agree with her that symptoms are consistent with left acute otitis media  Plan: Amoxicillin 500 mg 3 times a day for 10 days.  May take over-the-counter decongestant such as DayQuil or Sudafed.  Call if not improving in 48 hours or sooner if worse.

## 2018-06-25 NOTE — Telephone Encounter (Signed)
Scheduled

## 2018-07-27 DIAGNOSIS — D229 Melanocytic nevi, unspecified: Secondary | ICD-10-CM | POA: Diagnosis not present

## 2018-12-28 ENCOUNTER — Other Ambulatory Visit: Payer: Self-pay

## 2018-12-28 ENCOUNTER — Other Ambulatory Visit: Payer: 59 | Admitting: Internal Medicine

## 2018-12-28 DIAGNOSIS — Z1329 Encounter for screening for other suspected endocrine disorder: Secondary | ICD-10-CM | POA: Diagnosis not present

## 2018-12-28 DIAGNOSIS — Z1321 Encounter for screening for nutritional disorder: Secondary | ICD-10-CM

## 2018-12-28 DIAGNOSIS — Z13220 Encounter for screening for lipoid disorders: Secondary | ICD-10-CM | POA: Diagnosis not present

## 2018-12-28 DIAGNOSIS — Z0000 Encounter for general adult medical examination without abnormal findings: Secondary | ICD-10-CM

## 2018-12-28 LAB — COMPLETE METABOLIC PANEL WITH GFR
AG Ratio: 1.8 (calc) (ref 1.0–2.5)
ALT: 9 U/L (ref 6–29)
AST: 13 U/L (ref 10–30)
Albumin: 4.6 g/dL (ref 3.6–5.1)
Alkaline phosphatase (APISO): 80 U/L (ref 31–125)
BUN: 9 mg/dL (ref 7–25)
CO2: 24 mmol/L (ref 20–32)
Calcium: 9.1 mg/dL (ref 8.6–10.2)
Chloride: 103 mmol/L (ref 98–110)
Creat: 0.64 mg/dL (ref 0.50–1.10)
GFR, Est African American: 138 mL/min/{1.73_m2} (ref >=60)
GFR, Est Non African American: 119 mL/min/{1.73_m2} (ref >=60)
Globulin: 2.5 g/dL (calc) (ref 1.9–3.7)
Glucose, Bld: 81 mg/dL (ref 65–99)
Potassium: 4.1 mmol/L (ref 3.5–5.3)
Sodium: 137 mmol/L (ref 135–146)
Total Bilirubin: 0.7 mg/dL (ref 0.2–1.2)
Total Protein: 7.1 g/dL (ref 6.1–8.1)

## 2018-12-28 LAB — CBC WITH DIFFERENTIAL/PLATELET
Absolute Monocytes: 593 cells/uL (ref 200–950)
Basophils Absolute: 52 cells/uL (ref 0–200)
Basophils Relative: 0.5 %
Eosinophils Absolute: 146 cells/uL (ref 15–500)
Eosinophils Relative: 1.4 %
HCT: 41.8 % (ref 35.0–45.0)
Hemoglobin: 13.9 g/dL (ref 11.7–15.5)
Lymphs Abs: 2434 cells/uL (ref 850–3900)
MCH: 30 pg (ref 27.0–33.0)
MCHC: 33.3 g/dL (ref 32.0–36.0)
MCV: 90.3 fL (ref 80.0–100.0)
MPV: 9.5 fL (ref 7.5–12.5)
Monocytes Relative: 5.7 %
Neutro Abs: 7176 cells/uL (ref 1500–7800)
Neutrophils Relative %: 69 %
Platelets: 269 10*3/uL (ref 140–400)
RBC: 4.63 10*6/uL (ref 3.80–5.10)
RDW: 12.5 % (ref 11.0–15.0)
Total Lymphocyte: 23.4 %
WBC: 10.4 10*3/uL (ref 3.8–10.8)

## 2018-12-28 LAB — LIPID PANEL
Cholesterol: 138 mg/dL (ref <200)
HDL: 53 mg/dL (ref >=50)
LDL Cholesterol (Calc): 71 mg/dL (calc)
Non-HDL Cholesterol (Calc): 85 mg/dL (calc) (ref <130)
Total CHOL/HDL Ratio: 2.6 (calc) (ref <5.0)
Triglycerides: 67 mg/dL (ref <150)

## 2018-12-28 LAB — TSH: TSH: 5.42 mIU/L — ABNORMAL HIGH

## 2018-12-28 LAB — VITAMIN D 25 HYDROXY (VIT D DEFICIENCY, FRACTURES): Vit D, 25-Hydroxy: 24 ng/mL — ABNORMAL LOW (ref 30–100)

## 2019-01-04 ENCOUNTER — Telehealth: Payer: Self-pay | Admitting: Internal Medicine

## 2019-01-04 ENCOUNTER — Other Ambulatory Visit: Payer: Self-pay

## 2019-01-04 ENCOUNTER — Encounter: Payer: Self-pay | Admitting: Internal Medicine

## 2019-01-04 ENCOUNTER — Ambulatory Visit (INDEPENDENT_AMBULATORY_CARE_PROVIDER_SITE_OTHER): Payer: 59 | Admitting: Internal Medicine

## 2019-01-04 VITALS — BP 102/64 | HR 70 | Temp 98.0°F | Ht 67.0 in | Wt 198.0 lb

## 2019-01-04 DIAGNOSIS — Z331 Pregnant state, incidental: Secondary | ICD-10-CM | POA: Diagnosis not present

## 2019-01-04 DIAGNOSIS — R7989 Other specified abnormal findings of blood chemistry: Secondary | ICD-10-CM

## 2019-01-04 DIAGNOSIS — Z0001 Encounter for general adult medical examination with abnormal findings: Secondary | ICD-10-CM

## 2019-01-04 DIAGNOSIS — Z0000 Encounter for general adult medical examination without abnormal findings: Secondary | ICD-10-CM

## 2019-01-04 DIAGNOSIS — Z3A01 Less than 8 weeks gestation of pregnancy: Secondary | ICD-10-CM

## 2019-01-04 LAB — POCT URINALYSIS DIPSTICK
Appearance: NEGATIVE
Bilirubin, UA: NEGATIVE
Blood, UA: NEGATIVE
Glucose, UA: NEGATIVE
Ketones, UA: NEGATIVE
Leukocytes, UA: NEGATIVE
Nitrite, UA: NEGATIVE
Odor: NEGATIVE
Protein, UA: NEGATIVE
Spec Grav, UA: 1.01 (ref 1.010–1.025)
Urobilinogen, UA: 0.2 E.U./dL
pH, UA: 7 (ref 5.0–8.0)

## 2019-01-04 LAB — TSH: TSH: 3.48 mIU/L

## 2019-01-04 NOTE — Patient Instructions (Signed)
TSH will be repeated.  We will forward. lab results to Dr. Vanessa Kick.  Immunizations are up-to-date.  May need to be started on thyroid replacement medication if repeat TSH level is elevated.

## 2019-01-04 NOTE — Telephone Encounter (Signed)
Faxed office notes and labs to Dr Vanessa Kick [phone] and fax [phone]

## 2019-01-04 NOTE — Progress Notes (Signed)
   Subjective:    Patient ID: Carla Gonzalez, female    DOB: 1985-11-19, 33 y.o.   MRN: [id number]  HPI   33 year old Female for health maintenance exam and evaluation of medical issues.  Recently learned that she is less than [redacted] weeks pregnant.  Has GYN appointment Monday, November 2 with Dr. Vanessa Kick.  General health is excellent.  She is allergic to Sulfa-causes hives.  Had appendectomy in 2009.  No history of serious illnesses or accidents  Review of the labs showed elevated TSH at 5.42 and 2 years ago was 2.30.  This will be repeated today.  Social History: Has one year old daughter. Nonsmoker.  Married.  Works as a Designer, jewellery for [employer].  Husband is a Dealer.  Family history: Parents in good health.  Younger sister in good health.  Review of Systems complaining of morning sickness symptoms     Objective:   Physical Exam Blood pressure 102/64, pulse 70, temperature 98 degrees orally, weight 198 pounds, BMI 31.01  Skin warm and dry.  Nodes none.  Neck is supple without JVD or adenopathy.  Thyroid may be slightly enlarged and symmetrical.  No nodules appreciated.  Chest is clear to auscultation without rales or wheezing.  Breasts normal female without masses.  Cardiac exam regular rate and rhythm normal S1 and S2 without murmurs.  Abdomen soft nondistended without hepatosplenomegaly masses or tenderness.  No lower extremity edema.  Neuro no focal deficits on brief neurological exam.  Her thought, judgment, and affect are all normal.       Assessment & Plan:  Elevated TSH-this will be repeated today.  Has GYN appointment November 2.  Likely will need to be started on thyroid replacement medication pending this result.  Less than 8 weeks intrauterine pregnancy-has upcoming GYN appointment November 2.  Has morning sickness.  BMI 31  Plan: Follow-up with Dr. Vanessa Kick November 2 who will be forwarded a copy of this report and her lab studies.  Has had  flu vaccine through employment.  Tetanus immunization is up-to-date.

## 2019-01-07 DIAGNOSIS — Z369 Encounter for antenatal screening, unspecified: Secondary | ICD-10-CM | POA: Diagnosis not present

## 2019-01-07 DIAGNOSIS — N925 Other specified irregular menstruation: Secondary | ICD-10-CM | POA: Diagnosis not present

## 2019-01-07 DIAGNOSIS — Z124 Encounter for screening for malignant neoplasm of cervix: Secondary | ICD-10-CM | POA: Diagnosis not present

## 2019-01-07 DIAGNOSIS — Z3480 Encounter for supervision of other normal pregnancy, unspecified trimester: Secondary | ICD-10-CM | POA: Diagnosis not present

## 2019-01-07 DIAGNOSIS — Z3201 Encounter for pregnancy test, result positive: Secondary | ICD-10-CM | POA: Diagnosis not present

## 2019-01-23 DIAGNOSIS — Z3481 Encounter for supervision of other normal pregnancy, first trimester: Secondary | ICD-10-CM | POA: Diagnosis not present

## 2019-01-23 DIAGNOSIS — Z3480 Encounter for supervision of other normal pregnancy, unspecified trimester: Secondary | ICD-10-CM | POA: Diagnosis not present

## 2019-01-23 DIAGNOSIS — Z369 Encounter for antenatal screening, unspecified: Secondary | ICD-10-CM | POA: Diagnosis not present

## 2019-01-23 LAB — OB RESULTS CONSOLE ANTIBODY SCREEN: Antibody Screen: NEGATIVE

## 2019-01-23 LAB — OB RESULTS CONSOLE RUBELLA ANTIBODY, IGM: Rubella: IMMUNE

## 2019-01-23 LAB — OB RESULTS CONSOLE GC/CHLAMYDIA
Chlamydia: NEGATIVE
Gonorrhea: NEGATIVE

## 2019-01-23 LAB — OB RESULTS CONSOLE ABO/RH: RH Type: POSITIVE

## 2019-01-23 LAB — OB RESULTS CONSOLE HIV ANTIBODY (ROUTINE TESTING): HIV: NONREACTIVE

## 2019-01-23 LAB — OB RESULTS CONSOLE RPR: RPR: NONREACTIVE

## 2019-01-23 LAB — OB RESULTS CONSOLE HEPATITIS B SURFACE ANTIGEN: Hepatitis B Surface Ag: NEGATIVE

## 2019-02-25 DIAGNOSIS — Z369 Encounter for antenatal screening, unspecified: Secondary | ICD-10-CM | POA: Diagnosis not present

## 2019-03-22 DIAGNOSIS — Z363 Encounter for antenatal screening for malformations: Secondary | ICD-10-CM | POA: Diagnosis not present

## 2019-03-22 DIAGNOSIS — R946 Abnormal results of thyroid function studies: Secondary | ICD-10-CM | POA: Diagnosis not present

## 2019-04-19 DIAGNOSIS — Z369 Encounter for antenatal screening, unspecified: Secondary | ICD-10-CM | POA: Diagnosis not present

## 2019-05-17 DIAGNOSIS — Z3480 Encounter for supervision of other normal pregnancy, unspecified trimester: Secondary | ICD-10-CM | POA: Diagnosis not present

## 2019-05-17 DIAGNOSIS — Z369 Encounter for antenatal screening, unspecified: Secondary | ICD-10-CM | POA: Diagnosis not present

## 2019-05-30 DIAGNOSIS — O99810 Abnormal glucose complicating pregnancy: Secondary | ICD-10-CM | POA: Diagnosis not present

## 2019-06-01 ENCOUNTER — Inpatient Hospital Stay (HOSPITAL_COMMUNITY)
Admission: AD | Admit: 2019-06-01 | Discharge: 2019-06-01 | Disposition: A | Discharge status: Home / Self Care | Payer: 59 | Source: Ambulatory Visit | Attending: Obstetrics and Gynecology | Admitting: Obstetrics and Gynecology

## 2019-06-01 ENCOUNTER — Other Ambulatory Visit: Payer: Self-pay

## 2019-06-01 ENCOUNTER — Encounter (HOSPITAL_COMMUNITY): Payer: Self-pay | Admitting: Obstetrics and Gynecology

## 2019-06-01 DIAGNOSIS — N898 Other specified noninflammatory disorders of vagina: Secondary | ICD-10-CM | POA: Diagnosis not present

## 2019-06-01 DIAGNOSIS — O26893 Other specified pregnancy related conditions, third trimester: Secondary | ICD-10-CM

## 2019-06-01 DIAGNOSIS — Z3A28 28 weeks gestation of pregnancy: Secondary | ICD-10-CM

## 2019-06-01 NOTE — MAU Provider Note (Signed)
Chief Complaint:  Rupture of Membranes   First Provider Initiated Contact with Patient 06/01/19 1511     HPI: Carla Gonzalez is a 34 y.o. G3P1011 at 65w4dwho presents to maternity admissions reporting gush of clear fluid this morning.  Denies contractions or bleeding. She reports good fetal movement, denies vaginal bleeding, vaginal itching/burning, urinary symptoms, h/a, dizziness, n/v, diarrhea, constipation or fever/chills.    Vaginal Discharge The patient's primary symptoms include vaginal discharge. The patient's pertinent negatives include no genital itching, genital lesions, genital odor, pelvic pain or vaginal bleeding. This is a new problem. The current episode started today. The problem occurs intermittently. The problem has been resolved. The patient is experiencing no pain. She is pregnant. Pertinent negatives include no abdominal pain, back pain, chills, constipation, diarrhea, fever, headaches, nausea or vomiting. The vaginal discharge was clear and watery. There has been no bleeding. She has not been passing clots. She has not been passing tissue. Nothing aggravates the symptoms. She has tried nothing for the symptoms.   RN Note: Pt reports to mau after a gush of clear fluid around 0900 today while working.  Pt reports she has continued to feel some leaking since then.  Pt denies ctx or recent intercourse.  Pt reports good fetal movement.    Past Medical History: Past Medical History:  Diagnosis Date  . Medical history non-contributory     Past obstetric history: OB History  Gravida Para Term Preterm AB Living  3 1 1   1 1   SAB TAB Ectopic Multiple Live Births  1     0 1    # Outcome Date GA Lbr Len/2nd Weight Sex Delivery Anes PTL Lv  3 Current           2 Term 01/19/18 [redacted]w[redacted]d 21:02 / 03:10 4281 g F Vag-Vacuum EPI, Local  LIV  1 SAB             Past Surgical History: Past Surgical History:  Procedure Laterality Date  . APPENDECTOMY    . Grommet    .  TONSILLECTOMY AND ADENOIDECTOMY  2009    Family History: Family History  Problem Relation Age of Onset  . Hypertension Father     Social History: Social History   Tobacco Use  . Smoking status: Never Smoker  . Smokeless tobacco: Never Used  Substance Use Topics  . Alcohol use: Not Currently  . Drug use: Never    Allergies:  Allergies  Allergen Reactions  . Sulfa Antibiotics Hives    Meds:  No medications prior to admission.    I have reviewed patient's Past Medical Hx, Surgical Hx, Family Hx, Social Hx, medications and allergies.   ROS:  Review of Systems  Constitutional: Negative for chills and fever.  Gastrointestinal: Negative for abdominal pain, constipation, diarrhea, nausea and vomiting.  Genitourinary: Positive for vaginal discharge. Negative for pelvic pain.  Musculoskeletal: Negative for back pain.  Neurological: Negative for headaches.   Other systems negative  Physical Exam   Patient Vitals for the past 24 hrs:  BP Temp Temp src Pulse Resp SpO2  06/01/19 1456 121/67 98.6 F (37 C) Oral 83 16 96 %   Constitutional: Well-developed, well-nourished female in no acute distress.  Cardiovascular: normal rate and rhythm Respiratory: normal effort, clear to auscultation bilaterally GI: Abd soft, non-tender, gravid appropriate for gestational age.   No rebound or guarding. MS: Extremities nontender, no edema, normal ROM Neurologic: Alert and oriented x 4.  GU: Neg CVAT.  PELVIC  EXAM: Cervix pink, visually closed, without lesion, scant white creamy discharge, vaginal walls and external genitalia normal   No pooling or ferning       FHT:  Baseline 145 , moderate variability, Small accelerations present, no decelerations Contractions:  Rare   Labs: No results found for this or any previous visit (from the past 24 hour(s)).    Imaging:  No results found.  MAU Course/MDM: Sterile speculum exam was negative for PPROM Reassurance given FHR pattern  reassuring  Assessment: Single IUP at [redacted]w[redacted]d Vaginal discharge in pregnancy  Plan: Discharge home Preterm Labor precautions and fetal kick counts Follow up in Office for prenatal visits   Encouraged to return here or to other Urgent Care/ED if she develops worsening of symptoms, increase in pain, fever, or other concerning symptoms.   Pt stable at time of discharge.  Hansel Feinstein CNM, MSN Certified Nurse-Midwife 06/01/2019 3:11 PM

## 2019-06-01 NOTE — Discharge Instructions (Signed)
Third Trimester of Pregnancy The third trimester is from week 28 through week 40 (months 7 through 9). The third trimester is a time when the unborn baby (fetus) is growing rapidly. At the end of the ninth month, the fetus is about 20 inches in length and weighs 6-10 pounds. Body changes during your third trimester Your body will continue to go through many changes during pregnancy. The changes vary from woman to woman. During the third trimester:  Your weight will continue to increase. You can expect to gain 25-35 pounds (11-16 kg) by the end of the pregnancy.  You may begin to get stretch marks on your hips, abdomen, and breasts.  You may urinate more often because the fetus is moving lower into your pelvis and pressing on your bladder.  You may develop or continue to have heartburn. This is caused by increased hormones that slow down muscles in the digestive tract.  You may develop or continue to have constipation because increased hormones slow digestion and cause the muscles that push waste through your intestines to relax.  You may develop hemorrhoids. These are swollen veins (varicose veins) in the rectum that can itch or be painful.  You may develop swollen, bulging veins (varicose veins) in your legs.  You may have increased body aches in the pelvis, back, or thighs. This is due to weight gain and increased hormones that are relaxing your joints.  You may have changes in your hair. These can include thickening of your hair, rapid growth, and changes in texture. Some women also have hair loss during or after pregnancy, or hair that feels dry or thin. Your hair will most likely return to normal after your baby is born.  Your breasts will continue to grow and they will continue to become tender. A yellow fluid (colostrum) may leak from your breasts. This is the first milk you are producing for your baby.  Your belly button may stick out.  You may notice more swelling in your hands,  face, or ankles.  You may have increased tingling or numbness in your hands, arms, and legs. The skin on your belly may also feel numb.  You may feel short of breath because of your expanding uterus.  You may have more problems sleeping. This can be caused by the size of your belly, increased need to urinate, and an increase in your body's metabolism.  You may notice the fetus "dropping," or moving lower in your abdomen (lightening).  You may have increased vaginal discharge.  You may notice your joints feel loose and you may have pain around your pelvic bone. What to expect at prenatal visits You will have prenatal exams every 2 weeks until week 36. Then you will have weekly prenatal exams. During a routine prenatal visit:  You will be weighed to make sure you and the baby are growing normally.  Your blood pressure will be taken.  Your abdomen will be measured to track your baby's growth.  The fetal heartbeat will be listened to.  Any test results from the previous visit will be discussed.  You may have a cervical check near your due date to see if your cervix has softened or thinned (effaced).  You will be tested for Group B streptococcus. This happens between 35 and 37 weeks. Your health care provider may ask you:  What your birth plan is.  How you are feeling.  If you are feeling the baby move.  If you have had any abnormal   symptoms, such as leaking fluid, bleeding, severe headaches, or abdominal cramping.  If you are using any tobacco products, including cigarettes, chewing tobacco, and electronic cigarettes.  If you have any questions. Other tests or screenings that may be performed during your third trimester include:  Blood tests that check for low iron levels (anemia).  Fetal testing to check the health, activity level, and growth of the fetus. Testing is done if you have certain medical conditions or if there are problems during the pregnancy.  Nonstress test  (NST). This test checks the health of your baby to make sure there are no signs of problems, such as the baby not getting enough oxygen. During this test, a belt is placed around your belly. The baby is made to move, and its heart rate is monitored during movement. What is false labor? False labor is a condition in which you feel small, irregular tightenings of the muscles in the womb (contractions) that usually go away with rest, changing position, or drinking water. These are called Braxton Hicks contractions. Contractions may last for hours, days, or even weeks before true labor sets in. If contractions come at regular intervals, become more frequent, increase in intensity, or become painful, you should see your health care provider. What are the signs of labor?  Abdominal cramps.  Regular contractions that start at 10 minutes apart and become stronger and more frequent with time.  Contractions that start on the top of the uterus and spread down to the lower abdomen and back.  Increased pelvic pressure and dull back pain.  A watery or bloody mucus discharge that comes from the vagina.  Leaking of amniotic fluid. This is also known as your "water breaking." It could be a slow trickle or a gush. Let your health care provider know if it has a color or strange odor. If you have any of these signs, call your health care provider right away, even if it is before your due date. Follow these instructions at home: Medicines  Follow your health care provider's instructions regarding medicine use. Specific medicines may be either safe or unsafe to take during pregnancy.  Take a prenatal vitamin that contains at least 600 micrograms (mcg) of folic acid.  If you develop constipation, try taking a stool softener if your health care provider approves. Eating and drinking   Eat a balanced diet that includes fresh fruits and vegetables, whole grains, good sources of protein such as meat, eggs, or tofu,  and low-fat dairy. Your health care provider will help you determine the amount of weight gain that is right for you.  Avoid raw meat and uncooked cheese. These carry germs that can cause birth defects in the baby.  If you have low calcium intake from food, talk to your health care provider about whether you should take a daily calcium supplement.  Eat four or five small meals rather than three large meals a day.  Limit foods that are high in fat and processed sugars, such as fried and sweet foods.  To prevent constipation: ? Drink enough fluid to keep your urine clear or pale yellow. ? Eat foods that are high in fiber, such as fresh fruits and vegetables, whole grains, and beans. Activity  Exercise only as directed by your health care provider. Most women can continue their usual exercise routine during pregnancy. Try to exercise for 30 minutes at least 5 days a week. Stop exercising if you experience uterine contractions.  Avoid heavy lifting.  Do   not exercise in extreme heat or humidity, or at high altitudes.  Wear low-heel, comfortable shoes.  Practice good posture.  You may continue to have sex unless your health care provider tells you otherwise. Relieving pain and discomfort  Take frequent breaks and rest with your legs elevated if you have leg cramps or low back pain.  Take warm sitz baths to soothe any pain or discomfort caused by hemorrhoids. Use hemorrhoid cream if your health care provider approves.  Wear a good support bra to prevent discomfort from breast tenderness.  If you develop varicose veins: ? Wear support pantyhose or compression stockings as told by your healthcare provider. ? Elevate your feet for 15 minutes, 3-4 times a day. Prenatal care  Write down your questions. Take them to your prenatal visits.  Keep all your prenatal visits as told by your health care provider. This is important. Safety  Wear your seat belt at all times when driving.  Make  a list of emergency phone numbers, including numbers for family, friends, the hospital, and police and fire departments. General instructions  Avoid cat litter boxes and soil used by cats. These carry germs that can cause birth defects in the baby. If you have a cat, ask someone to clean the litter box for you.  Do not travel far distances unless it is absolutely necessary and only with the approval of your health care provider.  Do not use hot tubs, steam rooms, or saunas.  Do not drink alcohol.  Do not use any products that contain nicotine or tobacco, such as cigarettes and e-cigarettes. If you need help quitting, ask your health care provider.  Do not use any medicinal herbs or unprescribed drugs. These chemicals affect the formation and growth of the baby.  Do not douche or use tampons or scented sanitary pads.  Do not cross your legs for long periods of time.  To prepare for the arrival of your baby: ? Take prenatal classes to understand, practice, and ask questions about labor and delivery. ? Make a trial run to the hospital. ? Visit the hospital and tour the maternity area. ? Arrange for maternity or paternity leave through employers. ? Arrange for family and friends to take care of pets while you are in the hospital. ? Purchase a rear-facing car seat and make sure you know how to install it in your car. ? Pack your hospital bag. ? Prepare the baby's nursery. Make sure to remove all pillows and stuffed animals from the baby's crib to prevent suffocation.  Visit your dentist if you have not gone during your pregnancy. Use a soft toothbrush to brush your teeth and be gentle when you floss. Contact a health care provider if:  You are unsure if you are in labor or if your water has broken.  You become dizzy.  You have mild pelvic cramps, pelvic pressure, or nagging pain in your abdominal area.  You have lower back pain.  You have persistent nausea, vomiting, or  diarrhea.  You have an unusual or bad smelling vaginal discharge.  You have pain when you urinate. Get help right away if:  Your water breaks before 37 weeks.  You have regular contractions less than 5 minutes apart before 37 weeks.  You have a fever.  You are leaking fluid from your vagina.  You have spotting or bleeding from your vagina.  You have severe abdominal pain or cramping.  You have rapid weight loss or weight gain.  You have   shortness of breath with chest pain.  You notice sudden or extreme swelling of your face, hands, ankles, feet, or legs.  Your baby makes fewer than 10 movements in 2 hours.  You have severe headaches that do not go away when you take medicine.  You have vision changes. Summary  The third trimester is from week 28 through week 40, months 7 through 9. The third trimester is a time when the unborn baby (fetus) is growing rapidly.  During the third trimester, your discomfort may increase as you and your baby continue to gain weight. You may have abdominal, leg, and back pain, sleeping problems, and an increased need to urinate.  During the third trimester your breasts will keep growing and they will continue to become tender. A yellow fluid (colostrum) may leak from your breasts. This is the first milk you are producing for your baby.  False labor is a condition in which you feel small, irregular tightenings of the muscles in the womb (contractions) that eventually go away. These are called Braxton Hicks contractions. Contractions may last for hours, days, or even weeks before true labor sets in.  Signs of labor can include: abdominal cramps; regular contractions that start at 10 minutes apart and become stronger and more frequent with time; watery or bloody mucus discharge that comes from the vagina; increased pelvic pressure and dull back pain; and leaking of amniotic fluid. This information is not intended to replace advice given to you by your  health care provider. Make sure you discuss any questions you have with your health care provider. Document Revised: 06/14/2018 Document Reviewed: 03/29/2016 Elsevier Patient Education  2020 [employer]  

## 2019-06-01 NOTE — MAU Note (Signed)
Pt reports to mau after a gush of clear fluid around 0900 today while working.  Pt reports she has continued to feel some leaking since then.  Pt denies ctx or recent intercourse.  Pt reports good fetal movement.

## 2019-06-13 DIAGNOSIS — Z23 Encounter for immunization: Secondary | ICD-10-CM | POA: Diagnosis not present

## 2019-06-28 DIAGNOSIS — Z8759 Personal history of other complications of pregnancy, childbirth and the puerperium: Secondary | ICD-10-CM | POA: Diagnosis not present

## 2019-07-12 DIAGNOSIS — Z369 Encounter for antenatal screening, unspecified: Secondary | ICD-10-CM | POA: Diagnosis not present

## 2019-07-16 ENCOUNTER — Other Ambulatory Visit: Payer: Self-pay

## 2019-07-16 ENCOUNTER — Encounter: Payer: Self-pay | Admitting: Physical Therapy

## 2019-07-16 ENCOUNTER — Ambulatory Visit: Payer: 59 | Attending: Obstetrics and Gynecology | Admitting: Physical Therapy

## 2019-07-16 DIAGNOSIS — M545 Low back pain, unspecified: Secondary | ICD-10-CM

## 2019-07-16 DIAGNOSIS — R252 Cramp and spasm: Secondary | ICD-10-CM | POA: Diagnosis not present

## 2019-07-16 DIAGNOSIS — M6281 Muscle weakness (generalized): Secondary | ICD-10-CM | POA: Insufficient documentation

## 2019-07-16 DIAGNOSIS — R102 Pelvic and perineal pain: Secondary | ICD-10-CM | POA: Diagnosis not present

## 2019-07-16 NOTE — Therapy (Addendum)
[place] [facility] [address], [place], [place], [address] Phone: [phone]   Fax:  [phone]  Physical Therapy Evaluation  Patient Details  Name: Carla Gonzalez MRN: [id number] Date of Birth: [birth year] Referring Provider (PT): Dr. [name]   Encounter Date: 07/16/2019  PT End of Session - 07/16/19 1132    Visit Number  1    Date for PT Re-Evaluation  08/27/19    Authorization Type  [facility]    PT Start Time  1030    PT Stop Time  1110    PT Time Calculation (min)  40 min    Activity Tolerance  Patient tolerated treatment well;No increased pain    Behavior During Therapy  WFL for tasks assessed/performed       Past Medical History:  Diagnosis Date  . Medical history non-contributory     Past Surgical History:  Procedure Laterality Date  . APPENDECTOMY    . Grommet    . TONSILLECTOMY AND ADENOIDECTOMY  2009    There were no vitals filed for this visit.   Subjective Assessment - 07/16/19 1026    Subjective  Last child was 01/19/2018 and had pelvic floor pain at the end. This pregnancy is having more pain. Pain started 30 weeks ago with pelvic pain and back pain. Wearing her maternity belt. Little one is 26#.    Patient Stated Goals  Want to strengthen muscles and reduce the pain.    Currently in Pain?  Yes    Pain Score  4    worse is 7/10.   Pain Location  Pelvis    Pain Orientation  Mid    Pain Descriptors / Indicators  Sharp    Pain Type  Acute pain    Pain Onset  More than a month ago    Pain Frequency  Constant    Aggravating Factors   baby kicking on the bladder and pelvic floor, weight of baby goes to the left    Pain Relieving Factors  left, wear maternity belt    Multiple Pain Sites  Yes    Pain Score  3    Pain Location  Back    Pain Orientation  Lower;Left    Pain Descriptors / Indicators  Cramping    Pain Type  Acute pain    Pain Onset  More than a month ago    Pain Frequency   Intermittent    Aggravating Factors   sitting, standing,    Pain Relieving Factors  lay on left side         [facility] PT Assessment - 07/16/19 0001      Assessment   Medical Diagnosis  R10.2 Pelvic and perineal pain    Referring Provider (PT)  Dr. [name]    Onset Date/Surgical Date  04/18/19    Prior Therapy  none      Precautions   Precautions  Other (comment)    Precaution Comments  35 weeks      Restrictions   Weight Bearing Restrictions  No      Balance Screen   Has the patient fallen in the past 6 months  No    Has the patient had a decrease in activity level because of a fear of falling?   No    Is the patient reluctant to leave their home because of a fear of falling?   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      Cognition   Overall Cognitive Status  Within Functional Limits for tasks assessed      Posture/Postural Control   Posture/Postural Control  Postural limitations    Posture Comments  pregnant posture      ROM / Strength   AROM / PROM / Strength  AROM;PROM;Strength      AROM   Lumbar - Right Side Bend  decreased by 25%    Lumbar - Right Rotation  decreased by 25%      Strength   Right Hip Extension  4/5    Right Hip ABduction  3/5    Right Hip ADduction  4/5    Left Hip Extension  4/5    Left Hip ABduction  3/5    Left Hip ADduction  4/5      Palpation   Spinal mobility  decreased movement of the right L1-L5 and rotated right    SI assessment   left posteriorly rotated,     Palpation comment  tendernss located on pubic bone      Ambulation/Gait   Ambulation/Gait  Yes    Assistive device  None    Gait Pattern  --   waddling gait               Objective measurements completed on examination: See above findings.       [place] [facility] PT Treatment/Exercise - 07/16/19 0001      Lumbar Exercises: Standing   Other Standing Lumbar Exercises  standing against the wall and perfroming pelvic rock      Lumbar Exercises: Seated   Other Seated Lumbar Exercises  resistive left knee flexion to perform self posterior rotated ilium rotation      Lumbar Exercises: Quadruped   Madcat/Old Horse  15 reps    Madcat/Old Horse Limitations  with therapist guiding the right lumbar facets into extension, then pelvic sway and assisting the left lumbar facets into upslide    Other Quadruped Lumbar Exercises  going into childs pose and back into quadruped      Manual Therapy   Manual Therapy  Joint mobilization;Muscle Energy Technique    Joint Mobilization  gapping of the left and right lumbar facets    Muscle Energy Technique  to corect left ilium             PT Education - 07/16/19 1118    Education Details  Access Code: [email or web address]    Person(s) Educated  Patient    Methods  Explanation;Demonstration;Verbal cues;Handout    Comprehension  Returned demonstration;Verbalized understanding       PT Short Term Goals - 07/16/19 1144      PT SHORT TERM GOAL #1   Title  independent with initial HEP to correct pelvis and lumbar ROM    Time  3    Period  Weeks    Status  New    Target Date  08/06/19        PT Long Term Goals - 07/16/19 1145      PT LONG TERM GOAL #1   Title  understand ways to manage back pain during daily activities    Time  6    Period  Weeks    Status  New    Target  Date  08/27/19      PT LONG TERM GOAL #2   Title  understand ways to give birth to reduce the risk of increasing back pain    Time  6    Period  Weeks    Status  New    Target Date  08/27/19      PT LONG TERM GOAL #3   Title  understand correct body mechanics to decrease strain on the pelvic floor and back pain    Time  6    Period  Weeks    Status  New    Target Date  08/27/19      PT LONG TERM GOAL #4   Title   pelvic and back pain decreased >/= 50% due to correct alignment of the pelvis and improved lumbar mobility    Time  6    Period  Weeks    Status  New    Target Date  08/27/19             Plan - 07/16/19 1118    Clinical Impression Statement  Patient is a 34 year old female with lumbar and pelvic pain for the past several months. Patient is [redacted] weeks pregnant and due on 08/20/2019. Patient reports her pain level is intermittent in the lumbar at level 3/10.and the pelvic pain averages 4-7/10. Paitent is wearing a maternity belt for pain relief.Left ilium rotated posteriorly. Decreased movement of the right lumbar facets. Lumbar rotationa and sidebending decreased by 25%. Decreased strength in bilateral hip extension, abduction and adduction. Tenderness located in the pubic bone. Patient walks with a waddling gait. After manual work, patient had decreased pain and able to walk without a waddling gait. Patient will benefit from skilled therapy to reduce her pain, imporve pelvic stability and ROM of lumbar.    Personal Factors and Comorbidities  Profession;Comorbidity 1    Comorbidities  [redacted] weeks pregnant    Examination-Activity Limitations  Bend;Stand;Sit    Examination-Participation Restrictions  Laundry;Meal Prep;Cleaning;Community Activity    Stability/Clinical Decision Making  Stable/Uncomplicated    Clinical Decision Making  Moderate    Rehab Potential  Excellent    PT Frequency  1x / week    PT Duration  6 weeks    PT Treatment/Interventions  Moist Heat;Cryotherapy;Therapeutic activities;Therapeutic exercise;Neuromuscular re-education;Manual techniques;Patient/family education;Spinal Manipulations;Joint Manipulations    PT Next Visit Plan  assess pelvic alignment, hip abduction strength; ways to give birth to reduce back pain, lumbar ROM    PT Home Exercise Plan  Access Code: [email or web address]    Consulted and Agree with Plan of Care  Patient       Patient will benefit from skilled therapeutic  intervention in order to improve the following deficits and impairments:  Abnormal gait, Decreased range of motion, Increased fascial restricitons, Pain, Decreased strength, Decreased mobility  Visit Diagnosis: Muscle weakness (generalized) - Plan: PT plan of care cert/re-cert  Acute bilateral low back pain without sciatica - Plan: PT plan of care cert/re-cert  Cramp and spasm - Plan: PT plan of care cert/re-cert  Pelvic pain - Plan: PT plan of care cert/re-cert     Problem List Patient Active Problem List   Diagnosis Date Noted  . 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, [place], [place], [address] Phone: [phone]   Fax:  [phone]  Name: Carla Gonzalez MRN: [id number] Date of Birth: 01-19-1986  PHYSICAL THERAPY DISCHARGE SUMMARY  Visits from Start of Care: 1  Current functional level related to goals / functional outcomes: See above. Patient called today to cancel all appointments and wants to be discharged.    Remaining deficits: See above.    Education / Equipment: HEP if advised by MD.  Plan: Patient agrees to discharge.  Patient goals were not met. Patient is being discharged due to the patient's request. Thank you for the referral. Earlie Counts, PT 08/06/19 9:47 AM   ?????

## 2019-07-16 NOTE — Patient Instructions (Signed)
Access Code: [email or web address] URL: [email or web address] Date: 07/16/2019 Prepared by: Earlie Counts  Exercises Quadruped Cat Camel - 2 x daily - 7 x weekly - 1 sets - 10 reps Child's Pose Stretch - 1 x daily - 7 x weekly - 1 sets - 2 reps - 15 sec hold Hooklying Isometric Hip Flexion - 1 x daily - 7 x weekly - 1 sets - 3 reps - 5 sec hold Standing with Back Flat Against Wall - 2 x daily - 7 x weekly - 1 sets - 10 reps Seated Pelvic Tilt - 1 x daily - 7 x weekly - 1 sets - 10 reps [facility] [address], [place], [place] [address] Phone # [phone] Fax [phone]

## 2019-07-23 ENCOUNTER — Encounter: Payer: Self-pay | Admitting: Physical Therapy

## 2019-07-25 DIAGNOSIS — Z3480 Encounter for supervision of other normal pregnancy, unspecified trimester: Secondary | ICD-10-CM | POA: Diagnosis not present

## 2019-07-25 DIAGNOSIS — O26843 Uterine size-date discrepancy, third trimester: Secondary | ICD-10-CM | POA: Diagnosis not present

## 2019-07-25 DIAGNOSIS — O403XX0 Polyhydramnios, third trimester, not applicable or unspecified: Secondary | ICD-10-CM | POA: Diagnosis not present

## 2019-07-25 DIAGNOSIS — Z369 Encounter for antenatal screening, unspecified: Secondary | ICD-10-CM | POA: Diagnosis not present

## 2019-07-25 LAB — OB RESULTS CONSOLE GBS: GBS: NEGATIVE

## 2019-07-30 ENCOUNTER — Ambulatory Visit: Payer: 59 | Admitting: Physical Therapy

## 2019-07-30 ENCOUNTER — Other Ambulatory Visit: Payer: Self-pay | Admitting: Obstetrics and Gynecology

## 2019-07-30 DIAGNOSIS — O403XX1 Polyhydramnios, third trimester, fetus 1: Secondary | ICD-10-CM | POA: Diagnosis not present

## 2019-07-31 ENCOUNTER — Ambulatory Visit: Payer: 59 | Admitting: *Deleted

## 2019-07-31 ENCOUNTER — Other Ambulatory Visit: Payer: Self-pay

## 2019-07-31 ENCOUNTER — Ambulatory Visit: Payer: 59 | Attending: Obstetrics and Gynecology

## 2019-07-31 ENCOUNTER — Ambulatory Visit (HOSPITAL_BASED_OUTPATIENT_CLINIC_OR_DEPARTMENT_OTHER): Payer: 59 | Admitting: Obstetrics and Gynecology

## 2019-07-31 VITALS — BP 122/67 | HR 89

## 2019-07-31 DIAGNOSIS — O3663X0 Maternal care for excessive fetal growth, third trimester, not applicable or unspecified: Secondary | ICD-10-CM | POA: Insufficient documentation

## 2019-07-31 DIAGNOSIS — Z363 Encounter for antenatal screening for malformations: Secondary | ICD-10-CM

## 2019-07-31 DIAGNOSIS — O0990 Supervision of high risk pregnancy, unspecified, unspecified trimester: Secondary | ICD-10-CM

## 2019-07-31 DIAGNOSIS — O358XX0 Maternal care for other (suspected) fetal abnormality and damage, not applicable or unspecified: Secondary | ICD-10-CM

## 2019-07-31 DIAGNOSIS — O403XX0 Polyhydramnios, third trimester, not applicable or unspecified: Secondary | ICD-10-CM | POA: Diagnosis not present

## 2019-07-31 DIAGNOSIS — Z3A37 37 weeks gestation of pregnancy: Secondary | ICD-10-CM | POA: Diagnosis not present

## 2019-07-31 DIAGNOSIS — O403XX1 Polyhydramnios, third trimester, fetus 1: Secondary | ICD-10-CM

## 2019-07-31 DIAGNOSIS — O359XX0 Maternal care for (suspected) fetal abnormality and damage, unspecified, not applicable or unspecified: Secondary | ICD-10-CM | POA: Diagnosis not present

## 2019-07-31 DIAGNOSIS — O09293 Supervision of pregnancy with other poor reproductive or obstetric history, third trimester: Secondary | ICD-10-CM

## 2019-07-31 NOTE — Progress Notes (Signed)
MFM Note  This patient was seen for an ultrasound and consultation due to fetal macrosomia and ventriculomegaly noted in the fetal brain on an ultrasound performed in your office yesterday.  The patient reports that she has screened negative for gestational diabetes in her current pregnancy.  She reports that she gave birth to a 9 pound 7 ounce baby in her first pregnancy.  She did encounter shoulder dystocia and required an episiotomy and had a perineal laceration during that delivery.  She reports that her prior ultrasounds performed in your office have all been within normal limits.  She denies any other significant past medical history.  Due to ventriculomegaly noted yesterday, she had CMV testing that did not show an acute infection.  She reports that she had a negative screening test for fetal aneuploidy drawn in her current pregnancy.  On today's exam, the overall EFW measures 8 pounds 9 ounces which is at the [place] percentile for her gestational age.  Polyhydramnios with a total AFI of 30.5 cm is noted.  The views of the fetal anatomy were limited today due to her advanced gestational age.    The lateral ventricles in the fetal brain were difficult to assess due to the fetal position.  Possible ventriculomegaly measuring 1.1 to 1.3 cm dilated was noted.  However due to the fetal position, it is questionable if the lateral ventricles were measured in the correct plane.  I could not get the fetus to be in the right position for an accurate measurement of the lateral ventricles.  She was reassured that most cases of mild ventriculomegaly noted at term have good outcomes.  Her baby should be examined and have a head ultrasound after birth to determine if ventriculomegaly is present.  Due to polyhydramnios noted today, she should start weekly fetal testing in your office and continue these tests until delivery.  I would recommend that she undergo an induction at around 39 weeks.   Based on the EFW  obtained today, she was advised that this baby will most likely be of similar weight to her first baby if she is delivered at around 39 weeks.  Based on current ACOG recommendations, a cesarean delivery would be recommended in someone without diabetes if the EFW was 5000 g or greater.  A cesarean delivery may be considered if she wants to do everything possible to avoid another shoulder dystocia or if the fetus remains in the breech/transverse presentation.  At the end of the consultation, the patient stated that all of her questions had been answered to her complete satisfaction.    Thank you for referring this patient for a Maternal-Fetal Medicine consultation.  A total of 30 minutes was spent counseling and coordinating the care for this patient.  Greater than 50% of the time was spent in direct face-to-face contact.

## 2019-08-06 ENCOUNTER — Encounter: Payer: 59 | Admitting: Physical Therapy

## 2019-08-06 DIAGNOSIS — O403XX1 Polyhydramnios, third trimester, fetus 1: Secondary | ICD-10-CM | POA: Diagnosis not present

## 2019-08-09 ENCOUNTER — Encounter (HOSPITAL_COMMUNITY): Payer: Self-pay

## 2019-08-09 NOTE — Patient Instructions (Signed)
Tyneka Scafidi  08/09/2019   Your procedure is scheduled on:  08/16/2019  Arrive at 97 at Entrance C on [address] at [facility]  and [employer]. You are invited to use the FREE valet parking or use the Visitor's parking deck.  Pick up the phone at the desk and dial [phone].  Call this number if you have problems the morning of surgery: [phone]  Remember:   Do not eat food:(After Midnight) Desps de medianoche.  Do not drink clear liquids: (After Midnight) Desps de medianoche.  Take these medicines the morning of surgery with A SIP OF WATER:  none   Do not wear jewelry, make-up or nail polish.  Do not wear lotions, powders, or perfumes. Do not wear deodorant.  Do not shave 48 hours prior to surgery.  Do not bring valuables to the hospital.  [facility] is not   responsible for any belongings or valuables brought to the hospital.  Contacts, dentures or bridgework may not be worn into surgery.  Leave suitcase in the car. After surgery it may be brought to your room.  For patients admitted to the hospital, checkout time is 11:00 AM the day of              discharge.      Please read over the following fact sheets that you were given:     Preparing for Surgery

## 2019-08-12 ENCOUNTER — Inpatient Hospital Stay (HOSPITAL_COMMUNITY): Payer: 59 | Admitting: Anesthesiology

## 2019-08-12 ENCOUNTER — Encounter (HOSPITAL_COMMUNITY)
Admission: AD | Disposition: A | Discharge status: Home / Self Care | Payer: Self-pay | Source: Home / Self Care | Attending: Obstetrics

## 2019-08-12 ENCOUNTER — Encounter (HOSPITAL_COMMUNITY): Payer: Self-pay | Admitting: Obstetrics and Gynecology

## 2019-08-12 ENCOUNTER — Inpatient Hospital Stay (HOSPITAL_COMMUNITY)
Admission: AD | Admit: 2019-08-12 | Discharge: 2019-08-14 | DRG: 787 | Disposition: A | Discharge status: Home / Self Care | Payer: 59 | Attending: Obstetrics | Admitting: Obstetrics

## 2019-08-12 DIAGNOSIS — O9081 Anemia of the puerperium: Secondary | ICD-10-CM | POA: Diagnosis not present

## 2019-08-12 DIAGNOSIS — O329XX0 Maternal care for malpresentation of fetus, unspecified, not applicable or unspecified: Secondary | ICD-10-CM | POA: Diagnosis not present

## 2019-08-12 DIAGNOSIS — O403XX0 Polyhydramnios, third trimester, not applicable or unspecified: Principal | ICD-10-CM | POA: Diagnosis present

## 2019-08-12 DIAGNOSIS — Z20822 Contact with and (suspected) exposure to covid-19: Secondary | ICD-10-CM | POA: Diagnosis present

## 2019-08-12 DIAGNOSIS — D62 Acute posthemorrhagic anemia: Secondary | ICD-10-CM | POA: Diagnosis not present

## 2019-08-12 DIAGNOSIS — O322XX0 Maternal care for transverse and oblique lie, not applicable or unspecified: Secondary | ICD-10-CM | POA: Diagnosis present

## 2019-08-12 DIAGNOSIS — Z3A00 Weeks of gestation of pregnancy not specified: Secondary | ICD-10-CM | POA: Diagnosis not present

## 2019-08-12 DIAGNOSIS — O403XX1 Polyhydramnios, third trimester, fetus 1: Secondary | ICD-10-CM | POA: Diagnosis not present

## 2019-08-12 DIAGNOSIS — O322XX1 Maternal care for transverse and oblique lie, fetus 1: Secondary | ICD-10-CM | POA: Diagnosis not present

## 2019-08-12 DIAGNOSIS — Z8759 Personal history of other complications of pregnancy, childbirth and the puerperium: Secondary | ICD-10-CM

## 2019-08-12 DIAGNOSIS — Z3A38 38 weeks gestation of pregnancy: Secondary | ICD-10-CM | POA: Diagnosis not present

## 2019-08-12 DIAGNOSIS — O3663X0 Maternal care for excessive fetal growth, third trimester, not applicable or unspecified: Secondary | ICD-10-CM | POA: Diagnosis not present

## 2019-08-12 LAB — SARS CORONAVIRUS 2 BY RT PCR (HOSPITAL ORDER, PERFORMED IN ~~LOC~~ HOSPITAL LAB): SARS Coronavirus 2: NEGATIVE

## 2019-08-12 LAB — POCT FERN TEST: POCT Fern Test: POSITIVE

## 2019-08-12 LAB — CBC
HCT: 38.9 % (ref 36.0–46.0)
Hemoglobin: 12.9 g/dL (ref 12.0–15.0)
MCH: 29.7 pg (ref 26.0–34.0)
MCHC: 33.2 g/dL (ref 30.0–36.0)
MCV: 89.6 fL (ref 80.0–100.0)
Platelets: 169 10*3/uL (ref 150–400)
RBC: 4.34 MIL/uL (ref 3.87–5.11)
RDW: 14.5 % (ref 11.5–15.5)
WBC: 13.9 10*3/uL — ABNORMAL HIGH (ref 4.0–10.5)
nRBC: 0 % (ref 0.0–0.2)

## 2019-08-12 LAB — TYPE AND SCREEN
ABO/RH(D): O POS
Antibody Screen: NEGATIVE

## 2019-08-12 LAB — RPR: RPR Ser Ql: NONREACTIVE

## 2019-08-12 LAB — ABO/RH: ABO/RH(D): O POS

## 2019-08-12 SURGERY — Surgical Case
Anesthesia: Spinal

## 2019-08-12 MED ORDER — SIMETHICONE 80 MG PO CHEW
80.0000 mg | CHEWABLE_TABLET | Freq: Three times a day (TID) | ORAL | Status: DC
  Administered 2019-08-12 – 2019-08-14 (×5): 80 mg via ORAL
  Filled 2019-08-12 (×6): qty 1

## 2019-08-12 MED ORDER — PHENYLEPHRINE HCL-NACL 20-0.9 MG/250ML-% IV SOLN
INTRAVENOUS | Status: AC
  Filled 2019-08-12: qty 250

## 2019-08-12 MED ORDER — BUPIVACAINE IN DEXTROSE 0.75-8.25 % IT SOLN
INTRATHECAL | Status: DC | PRN
  Administered 2019-08-12: 1.6 mL via INTRATHECAL

## 2019-08-12 MED ORDER — NALOXONE HCL 4 MG/10ML IJ SOLN
1.0000 ug/kg/h | INTRAVENOUS | Status: DC | PRN
  Filled 2019-08-12: qty 5

## 2019-08-12 MED ORDER — KETOROLAC TROMETHAMINE 30 MG/ML IJ SOLN
30.0000 mg | Freq: Once | INTRAMUSCULAR | Status: AC | PRN
  Administered 2019-08-12: 30 mg via INTRAVENOUS

## 2019-08-12 MED ORDER — ACETAMINOPHEN 500 MG PO TABS: 1000.0000 mg | ORAL_TABLET | Freq: Four times a day (QID) | ORAL | Status: DC

## 2019-08-12 MED ORDER — CEFAZOLIN SODIUM-DEXTROSE 2-4 GM/100ML-% IV SOLN
2.0000 g | INTRAVENOUS | Status: DC
  Filled 2019-08-12: qty 100

## 2019-08-12 MED ORDER — KETOROLAC TROMETHAMINE 30 MG/ML IJ SOLN: 30.0000 mg | Freq: Four times a day (QID) | INTRAMUSCULAR | Status: AC | PRN

## 2019-08-12 MED ORDER — ONDANSETRON HCL 4 MG/2ML IJ SOLN
INTRAMUSCULAR | Status: DC | PRN
  Administered 2019-08-12: 4 mg via INTRAVENOUS

## 2019-08-12 MED ORDER — PRENATAL MULTIVITAMIN CH
1.0000 | ORAL_TABLET | Freq: Every day | ORAL | Status: DC
  Administered 2019-08-13 – 2019-08-14 (×2): 1 via ORAL
  Filled 2019-08-12 (×2): qty 1

## 2019-08-12 MED ORDER — COCONUT OIL OIL: 1.0000 "application " | TOPICAL_OIL | Status: DC | PRN

## 2019-08-12 MED ORDER — FAMOTIDINE IN NACL 20-0.9 MG/50ML-% IV SOLN
20.0000 mg | Freq: Once | INTRAVENOUS | Status: AC
  Administered 2019-08-12: 20 mg via INTRAVENOUS
  Filled 2019-08-12: qty 50

## 2019-08-12 MED ORDER — FENTANYL CITRATE (PF) 100 MCG/2ML IJ SOLN
INTRAMUSCULAR | Status: DC | PRN
  Administered 2019-08-12: 15 ug via INTRATHECAL

## 2019-08-12 MED ORDER — LACTATED RINGERS IV SOLN
INTRAVENOUS | Status: DC | PRN
Start: 2019-08-12 — End: 2019-08-12

## 2019-08-12 MED ORDER — DIPHENHYDRAMINE HCL 50 MG/ML IJ SOLN: 12.5000 mg | INTRAMUSCULAR | Status: DC | PRN

## 2019-08-12 MED ORDER — DEXAMETHASONE SODIUM PHOSPHATE 10 MG/ML IJ SOLN
INTRAMUSCULAR | Status: AC
  Filled 2019-08-12: qty 1

## 2019-08-12 MED ORDER — NALBUPHINE HCL 10 MG/ML IJ SOLN: 5.0000 mg | Freq: Once | INTRAMUSCULAR | Status: DC | PRN

## 2019-08-12 MED ORDER — SIMETHICONE 80 MG PO CHEW
80.0000 mg | CHEWABLE_TABLET | ORAL | Status: DC
  Administered 2019-08-12 – 2019-08-14 (×2): 80 mg via ORAL
  Filled 2019-08-12 (×2): qty 1

## 2019-08-12 MED ORDER — SCOPOLAMINE 1 MG/3DAYS TD PT72
MEDICATED_PATCH | TRANSDERMAL | Status: AC
  Filled 2019-08-12: qty 1

## 2019-08-12 MED ORDER — SIMETHICONE 80 MG PO CHEW
80.0000 mg | CHEWABLE_TABLET | ORAL | Status: DC | PRN
  Administered 2019-08-12: 80 mg via ORAL

## 2019-08-12 MED ORDER — PHENYLEPHRINE HCL-NACL 20-0.9 MG/250ML-% IV SOLN
INTRAVENOUS | Status: DC | PRN
  Administered 2019-08-12: 60 ug/min via INTRAVENOUS

## 2019-08-12 MED ORDER — NALBUPHINE HCL 10 MG/ML IJ SOLN: 5.0000 mg | INTRAMUSCULAR | Status: DC | PRN

## 2019-08-12 MED ORDER — DIBUCAINE (PERIANAL) 1 % EX OINT: 1.0000 "application " | TOPICAL_OINTMENT | CUTANEOUS | Status: DC | PRN

## 2019-08-12 MED ORDER — OXYTOCIN-SODIUM CHLORIDE 30-0.9 UT/500ML-% IV SOLN
INTRAVENOUS | Status: AC
  Filled 2019-08-12: qty 500

## 2019-08-12 MED ORDER — NALOXONE HCL 0.4 MG/ML IJ SOLN: 0.4000 mg | INTRAMUSCULAR | Status: DC | PRN

## 2019-08-12 MED ORDER — OXYCODONE HCL 5 MG PO TABS
5.0000 mg | ORAL_TABLET | ORAL | Status: DC | PRN
  Administered 2019-08-13: 5 mg via ORAL
  Filled 2019-08-12: qty 1

## 2019-08-12 MED ORDER — KETOROLAC TROMETHAMINE 30 MG/ML IJ SOLN
INTRAMUSCULAR | Status: AC
  Filled 2019-08-12: qty 1

## 2019-08-12 MED ORDER — SODIUM CHLORIDE 0.9 % IR SOLN
Status: DC | PRN
  Administered 2019-08-12: 1

## 2019-08-12 MED ORDER — SCOPOLAMINE 1 MG/3DAYS TD PT72
1.0000 | MEDICATED_PATCH | Freq: Once | TRANSDERMAL | Status: DC
  Administered 2019-08-12: 1.5 mg via TRANSDERMAL

## 2019-08-12 MED ORDER — ONDANSETRON HCL 4 MG/2ML IJ SOLN
INTRAMUSCULAR | Status: AC
  Filled 2019-08-12: qty 2

## 2019-08-12 MED ORDER — OXYTOCIN-SODIUM CHLORIDE 30-0.9 UT/500ML-% IV SOLN: 2.5000 [IU]/h | INTRAVENOUS | Status: AC

## 2019-08-12 MED ORDER — MORPHINE SULFATE (PF) 0.5 MG/ML IJ SOLN
INTRAMUSCULAR | Status: DC | PRN
  Administered 2019-08-12: .15 mg via INTRATHECAL

## 2019-08-12 MED ORDER — IBUPROFEN 800 MG PO TABS
800.0000 mg | ORAL_TABLET | Freq: Four times a day (QID) | ORAL | Status: DC
  Administered 2019-08-13 – 2019-08-14 (×5): 800 mg via ORAL
  Filled 2019-08-12 (×6): qty 1

## 2019-08-12 MED ORDER — LACTATED RINGERS IV BOLUS
500.0000 mL | Freq: Once | INTRAVENOUS | Status: AC
  Administered 2019-08-12: 500 mL via INTRAVENOUS

## 2019-08-12 MED ORDER — MENTHOL 3 MG MT LOZG: 1.0000 | LOZENGE | OROMUCOSAL | Status: DC | PRN

## 2019-08-12 MED ORDER — WITCH HAZEL-GLYCERIN EX PADS: 1.0000 "application " | MEDICATED_PAD | CUTANEOUS | Status: DC | PRN

## 2019-08-12 MED ORDER — LACTATED RINGERS IV SOLN: INTRAVENOUS | Status: DC

## 2019-08-12 MED ORDER — DEXAMETHASONE SODIUM PHOSPHATE 10 MG/ML IJ SOLN
INTRAMUSCULAR | Status: DC | PRN
  Administered 2019-08-12: 10 mg via INTRAVENOUS

## 2019-08-12 MED ORDER — KETOROLAC TROMETHAMINE 30 MG/ML IJ SOLN
30.0000 mg | Freq: Four times a day (QID) | INTRAMUSCULAR | Status: AC
  Administered 2019-08-12: 30 mg via INTRAVENOUS
  Filled 2019-08-12 (×3): qty 1

## 2019-08-12 MED ORDER — OXYTOCIN-SODIUM CHLORIDE 30-0.9 UT/500ML-% IV SOLN
INTRAVENOUS | Status: DC | PRN
  Administered 2019-08-12: 150 mL via INTRAVENOUS
  Administered 2019-08-12: 100 mL via INTRAVENOUS

## 2019-08-12 MED ORDER — FENTANYL CITRATE (PF) 100 MCG/2ML IJ SOLN
INTRAMUSCULAR | Status: AC
  Filled 2019-08-12: qty 2

## 2019-08-12 MED ORDER — MORPHINE SULFATE (PF) 0.5 MG/ML IJ SOLN
INTRAMUSCULAR | Status: AC
  Filled 2019-08-12: qty 10

## 2019-08-12 MED ORDER — ACETAMINOPHEN 500 MG PO TABS
1000.0000 mg | ORAL_TABLET | Freq: Four times a day (QID) | ORAL | Status: DC
  Administered 2019-08-12 – 2019-08-14 (×8): 1000 mg via ORAL
  Filled 2019-08-12 (×8): qty 2

## 2019-08-12 MED ORDER — DIPHENHYDRAMINE HCL 25 MG PO CAPS: 25.0000 mg | ORAL_CAPSULE | Freq: Four times a day (QID) | ORAL | Status: DC | PRN

## 2019-08-12 MED ORDER — ACETAMINOPHEN 10 MG/ML IV SOLN: 1000.0000 mg | Freq: Once | INTRAVENOUS | Status: DC | PRN

## 2019-08-12 MED ORDER — DIPHENHYDRAMINE HCL 25 MG PO CAPS: 25.0000 mg | ORAL_CAPSULE | ORAL | Status: DC | PRN

## 2019-08-12 MED ORDER — KETOROLAC TROMETHAMINE 30 MG/ML IJ SOLN
30.0000 mg | Freq: Four times a day (QID) | INTRAMUSCULAR | Status: AC | PRN
  Administered 2019-08-12 – 2019-08-13 (×2): 30 mg via INTRAVENOUS

## 2019-08-12 MED ORDER — STERILE WATER FOR IRRIGATION IR SOLN
Status: DC | PRN
  Administered 2019-08-12: 1000 mL

## 2019-08-12 MED ORDER — SOD CITRATE-CITRIC ACID 500-334 MG/5ML PO SOLN
30.0000 mL | ORAL | Status: AC
  Administered 2019-08-12: 30 mL via ORAL
  Filled 2019-08-12: qty 30

## 2019-08-12 MED ORDER — SODIUM CHLORIDE 0.9% FLUSH: 3.0000 mL | INTRAVENOUS | Status: DC | PRN

## 2019-08-12 MED ORDER — ONDANSETRON HCL 4 MG/2ML IJ SOLN: 4.0000 mg | Freq: Three times a day (TID) | INTRAMUSCULAR | Status: DC | PRN

## 2019-08-12 MED ORDER — SENNOSIDES-DOCUSATE SODIUM 8.6-50 MG PO TABS
2.0000 | ORAL_TABLET | ORAL | Status: DC
  Administered 2019-08-12 – 2019-08-14 (×2): 2 via ORAL
  Filled 2019-08-12 (×2): qty 2

## 2019-08-12 MED ORDER — MORPHINE SULFATE (PF) 0.5 MG/ML IJ SOLN: INTRAMUSCULAR | Status: DC | PRN

## 2019-08-12 SURGICAL SUPPLY — 35 items
BENZOIN TINCTURE PRP APPL 2/3 (GAUZE/BANDAGES/DRESSINGS) ×3 IMPLANT
CHLORAPREP W/TINT 26ML (MISCELLANEOUS) ×3 IMPLANT
CLAMP CORD UMBIL (MISCELLANEOUS) IMPLANT
CLOSURE WOUND 1/2 X4 (GAUZE/BANDAGES/DRESSINGS) ×1
CLOTH BEACON ORANGE TIMEOUT ST (SAFETY) ×3 IMPLANT
DRSG OPSITE POSTOP 4X10 (GAUZE/BANDAGES/DRESSINGS) ×3 IMPLANT
ELECT REM PT RETURN 9FT ADLT (ELECTROSURGICAL) ×3
ELECTRODE REM PT RTRN 9FT ADLT (ELECTROSURGICAL) ×1 IMPLANT
EXTRACTOR VACUUM KIWI (MISCELLANEOUS) IMPLANT
GLOVE BIOGEL PI IND STRL 6.5 (GLOVE) ×1 IMPLANT
GLOVE BIOGEL PI IND STRL 7.0 (GLOVE) ×1 IMPLANT
GLOVE BIOGEL PI INDICATOR 6.5 (GLOVE) ×2
GLOVE BIOGEL PI INDICATOR 7.0 (GLOVE) ×2
GLOVE ECLIPSE 6.0 STRL STRAW (GLOVE) ×3 IMPLANT
GOWN STRL REUS W/TWL LRG LVL3 (GOWN DISPOSABLE) ×6 IMPLANT
KIT ABG SYR 3ML LUER SLIP (SYRINGE) IMPLANT
NDL HYPO 25X5/8 SAFETYGLIDE (NEEDLE) IMPLANT
NEEDLE HYPO 25X5/8 SAFETYGLIDE (NEEDLE) IMPLANT
NS IRRIG 1000ML POUR BTL (IV SOLUTION) ×3 IMPLANT
PACK C SECTION WH (CUSTOM PROCEDURE TRAY) ×3 IMPLANT
PAD OB MATERNITY 4.3X12.25 (PERSONAL CARE ITEMS) ×3 IMPLANT
PENCIL SMOKE EVAC W/HOLSTER (ELECTROSURGICAL) ×3 IMPLANT
RTRCTR C-SECT PINK 25CM LRG (MISCELLANEOUS) ×3 IMPLANT
STRIP CLOSURE SKIN 1/2X4 (GAUZE/BANDAGES/DRESSINGS) ×2 IMPLANT
SUT MNCRL 0 VIOLET CTX 36 (SUTURE) ×1 IMPLANT
SUT MNCRL AB 3-0 PS2 27 (SUTURE) ×3 IMPLANT
SUT MONOCRYL 0 CTX 36 (SUTURE) ×3
SUT PLAIN 0 NONE (SUTURE) IMPLANT
SUT VIC AB 0 CTX 36 (SUTURE) ×9
SUT VIC AB 0 CTX36XBRD ANBCTRL (SUTURE) ×3 IMPLANT
SUT VIC AB 2-0 CT1 27 (SUTURE) ×6
SUT VIC AB 2-0 CT1 TAPERPNT 27 (SUTURE) ×2 IMPLANT
TOWEL OR 17X24 6PK STRL BLUE (TOWEL DISPOSABLE) ×3 IMPLANT
TRAY FOLEY W/BAG SLVR 14FR LF (SET/KITS/TRAYS/PACK) ×3 IMPLANT
WATER STERILE IRR 1000ML POUR (IV SOLUTION) ×3 IMPLANT

## 2019-08-12 NOTE — MAU Note (Signed)
SROM at 0300-clear fluid.  Contractions since then that feel intense though not yet consistent.  Denies VB.  Endorses + FM.

## 2019-08-12 NOTE — Anesthesia Preprocedure Evaluation (Signed)
Anesthesia Evaluation  Patient identified by MRN, date of birth, ID band Patient awake    Reviewed: Allergy & Precautions, NPO status , Patient's Chart, lab work & pertinent test results  Airway Mallampati: II  TM Distance: >3 FB Neck ROM: Full    Dental no notable dental hx. (+) Teeth Intact, Dental Advisory Given   Pulmonary neg pulmonary ROS,    Pulmonary exam normal breath sounds clear to auscultation       Cardiovascular negative cardio ROS Normal cardiovascular exam Rhythm:Regular Rate:Normal     Neuro/Psych negative neurological ROS  negative psych ROS   GI/Hepatic negative GI ROS, Neg liver ROS,   Endo/Other  negative endocrine ROSObese BMI 37  Renal/GU negative Renal ROS  negative genitourinary   Musculoskeletal negative musculoskeletal ROS (+)   Abdominal   Peds  Hematology negative hematology ROS (+)   Anesthesia Other Findings Primary C/S for transverse lie and SROM  Reproductive/Obstetrics (+) Pregnancy                            Anesthesia Physical Anesthesia Plan  ASA: II  Anesthesia Plan: Spinal   Post-op Pain Management:    Induction:   PONV Risk Score and Plan: 2 and Treatment may vary due to age or medical condition  Airway Management Planned: Natural Airway  Additional Equipment:   Intra-op Plan:   Post-operative Plan:   Informed Consent: I have reviewed the patients History and Physical, chart, labs and discussed the procedure including the risks, benefits and alternatives for the proposed anesthesia with the patient or authorized representative who has indicated his/her understanding and acceptance.     Dental advisory given  Plan Discussed with: CRNA  Anesthesia Plan Comments:         Anesthesia Quick Evaluation

## 2019-08-12 NOTE — Anesthesia Procedure Notes (Signed)
Spinal  Patient location during procedure: OR Start time: 08/12/2019 7:28 AM End time: 08/12/2019 7:32 AM Staffing Performed: anesthesiologist  Anesthesiologist: Catalina Gravel, MD Preanesthetic Checklist Completed: patient identified, IV checked, risks and benefits discussed, surgical consent, monitors and equipment checked, pre-op evaluation and timeout performed Spinal Block Patient position: sitting Prep: DuraPrep and site prepped and draped Patient monitoring: continuous pulse ox and blood pressure Approach: midline Location: L3-4 Injection technique: single-shot Needle Needle type: Pencan  Needle gauge: 24 G Assessment Sensory level: T6 Additional Notes Functioning IV was confirmed and monitors were applied. Sterile prep and drape, including hand hygiene, mask and sterile gloves were used. The patient was positioned and the spine was prepped. The skin was anesthetized with lidocaine.  Free flow of clear CSF was obtained prior to injecting local anesthetic into the CSF.  The spinal needle aspirated freely following injection.  The needle was carefully withdrawn.  The patient tolerated the procedure well. Consent was obtained prior to procedure with all questions answered and concerns addressed. Risks including but not limited to bleeding, infection, nerve damage, paralysis, failed block, inadequate analgesia, allergic reaction, high spinal, itching and headache were discussed and the patient wished to proceed.   Hoy Morn, MD

## 2019-08-12 NOTE — H&P (Addendum)
34 y.o. [redacted]w[redacted]d  G3P1011 comes in c/o LOF, found to be fern+.  Otherwise has good fetal movement and no bleeding.  Past Medical History:  Diagnosis Date  . Medical history non-contributory     Past Surgical History:  Procedure Laterality Date  . APPENDECTOMY    . Grommet    . TONSILLECTOMY AND ADENOIDECTOMY  2009    OB History  Gravida Para Term Preterm AB Living  3 1 1   1 1   SAB TAB Ectopic Multiple Live Births  1     0 1    # Outcome Date GA Lbr Len/2nd Weight Sex Delivery Anes PTL Lv  3 Current           2 Term 01/19/18 [redacted]w[redacted]d 21:02 / 03:10 4281 g F Vag-Vacuum EPI, Local  LIV  1 SAB             Social History   Socioeconomic History  . Marital status: Married    Spouse name: Not on file  . Number of children: Not on file  . Years of education: Not on file  . Highest education level: Not on file  Occupational History  . Not on file  Tobacco Use  . Smoking status: Never Smoker  . Smokeless tobacco: Never Used  Substance and Sexual Activity  . Alcohol use: Not Currently  . Drug use: Never  . Sexual activity: Yes    Birth control/protection: None  Other Topics Concern  . Not on file  Social History Narrative  . Not on file   Social Determinants of Health   Financial Resource Strain:   . Difficulty of Paying Living Expenses:   Food Insecurity:   . Worried About Charity fundraiser in the Last Year:   . Arboriculturist in the Last Year:   Transportation Needs:   . Film/video editor (Medical):   Marland Kitchen Lack of Transportation (Non-Medical):   Physical Activity:   . Days of Exercise per Week:   . Minutes of Exercise per Session:   Stress:   . Feeling of Stress :   Social Connections:   . Frequency of Communication with Friends and Family:   . Frequency of Social Gatherings with Friends and Family:   . Attends Religious Services:   . Active Member of Clubs or Organizations:   . Attends Archivist Meetings:   Marland Kitchen Marital Status:   Intimate Partner  Violence:   . Fear of Current or Ex-Partner:   . Emotionally Abused:   Marland Kitchen Physically Abused:   . Sexually Abused:    Sulfa antibiotics    Prenatal Transfer Tool  Maternal Diabetes: No Genetic Screening: Normal Maternal Ultrasounds/Referrals: Normal Fetal Ultrasounds or other Referrals:  Referred to Materal Fetal Medicine  [place]: EFW 3956g (>90%), AC>97.7%. AFI 25.99, DVP 11.87. Polyhydramnios, Transverse position.  Suspected hydrocephalus,CMV titers negative, on review, MFM did not think that the baby had hydrocephalus. Maternal Substance Abuse:  No Significant Maternal Medications:  None Significant Maternal Lab Results: Group B Strep negative  Other PNC: uncomplicated.    Vitals:   08/12/19 0422 08/12/19 0424 08/12/19 0437  BP:  121/64 122/67  Pulse:  87 86  Resp:  19   Temp:  98.3 F (36.8 C)   Weight: 105.9 kg 105.9 kg     Lungs/Cor:  NAD Abdomen:  soft, gravid Ex:  no cords, erythema SVE:  1/Thick/Posterior FHTs:  140, good STV, NST R; Cat 1 tracing. Toco:  q 3-5  Per CNM: bedside US showing persistent transverse position   A/P   Admit for primary c/s for transverse position, polyhydramnios, and possible fetal macrosomia  GBS Neg  2g Ancef  Other routine pre-op care Repeat bedside US done my me: transverse spine anterior  Allyn Kenner

## 2019-08-12 NOTE — MAU Provider Note (Signed)
Pt informed that the ultrasound is considered a limited OB ultrasound and is not intended to be a complete ultrasound exam.  Patient also informed that the ultrasound is not being completed with the intent of assessing for fetal or placental anomalies or any pelvic abnormalities.  Explained that the purpose of today's ultrasound is to assess for  presentation.  Patient acknowledges the purpose of the exam and the limitations of the study.    Patient scheduled for C/S d/t fetal presentation.   Transverse presentation confirmed by ultrasound. Instructed RN to call private MD to obtain orders for C/S and prepare patient for OR.   Lajean Manes, CNM 08/12/19, 5:33 AM

## 2019-08-12 NOTE — Progress Notes (Signed)
Patient seen and examined.  C/s consent obtained by Dr. Rogue Bussing.  Discussed with patient potential need for vertical extension of uterine incision due to transverse position  (on my scan, spine was not directly anterior but pointing to superior uterus).  We discussed that if vertical uterine incision required, any additional pregnancies would need to be delivered via cesarean section.  She agrees and understands.

## 2019-08-12 NOTE — Anesthesia Postprocedure Evaluation (Signed)
Anesthesia Post Note  Patient: Carla Gonzalez  Procedure(s) Performed: CESAREAN SECTION (N/A )     Patient location during evaluation: PACU Anesthesia Type: Spinal Level of consciousness: oriented, awake and alert and awake Pain management: pain level controlled Vital Signs Assessment: post-procedure vital signs reviewed and stable Respiratory status: spontaneous breathing, respiratory function stable, patient connected to nasal cannula oxygen and nonlabored ventilation Cardiovascular status: blood pressure returned to baseline and stable Postop Assessment: no headache, no backache, no apparent nausea or vomiting, patient able to bend at knees and spinal receding Anesthetic complications: no    Last Vitals:  Vitals:   08/12/19 1334 08/12/19 1532  BP: 112/60   Pulse: 61   Resp: 18 18  Temp: 36.7 C   SpO2: 100% 100%    Last Pain:  Vitals:   08/12/19 1532  TempSrc:   PainSc: 1    Pain Goal: Patients Stated Pain Goal: 0 (08/12/19 1532)                 Catalina Gravel

## 2019-08-12 NOTE — Transfer of Care (Signed)
Immediate Anesthesia Transfer of Care Note  Patient: Carla Gonzalez  Procedure(s) Performed: CESAREAN SECTION (N/A )  Patient Location: PACU  Anesthesia Type:Spinal  Level of Consciousness: awake, alert  and oriented  Airway & Oxygen Therapy: Patient Spontanous Breathing  Post-op Assessment: Report given to RN and Post -op Vital signs reviewed and stable  Post vital signs: Reviewed and stable  Last Vitals:  Vitals Value Taken Time  BP 108/94 08/12/19 0834  Temp    Pulse 84 08/12/19 0842  Resp 17 08/12/19 0842  SpO2 98 % 08/12/19 0842  Vitals shown include unvalidated device data.  Last Pain:  Vitals:   08/12/19 0834  TempSrc: (P) Oral  PainSc: (P) 0-No pain         Complications: No apparent anesthesia complications

## 2019-08-12 NOTE — Op Note (Signed)
Cesarean Section Procedure Note  Pre-operative Diagnosis: 1. Intrauterine pregnancy at [redacted]w[redacted]d  2. Rupture of membranes 3. Transverse presentation 4. Polyhydramnios  Post-operative Diagnosis: same as above  Surgeon: Jerelyn Charles, MD  Assistants: Lyda Kalata  Procedure: Primary low transverse cesarean section   Anesthesia: Spinal anesthesia  Estimated Blood Loss: 542 mL         Drains: Foley catheter         Specimens: none              Complications:  None; patient tolerated the procedure well.         Disposition: PACU - hemodynamically stable.  Findings:  Normal uterus, tubes and ovaries bilaterally.  Viable female infant, 4281g (9lb 7oz) Apgars 6, 7, 8.    Procedure Details   Prior to cesarean section, bedside ultrasound confirmed transverse position, head maternal right and spine up.  After spinal  anesthesia was found to adequate, the patient was placed in the dorsal supine position with a leftward tilt, prepped and draped in the usual sterile manner. A Pfannenstiel incision was made and carried down through the subcutaneous tissue to the fascia.  The fascia was incised in the midline and the fascial incision was extended laterally with Mayo scissors. The superior aspect of the fascial incision was grasped with two Kocher clamps, tented up and the rectus muscles dissected off sharply. The rectus was then dissected off with blunt dissection and Mayo scissors inferiorly. The rectus muscles were separated in the midline. The abdominal peritoneum was identified, tented up, entered bluntly, and the incision was extended superiorly and inferiorly with good visualization of the bladder. The Alexis retractor was deployed. The vesicouterine peritoneum was identified and well below the lower uterine segment. A scalpel was then used to make a low transverse incision on the uterus which was extended in the cephalad-caudad direction with blunt dissection. The fluid was clear and a large  volume. The fetal vertex was in the lower right pelvis and able to be brought to the midline identified, elevated out of the pelvis and brought to the hysterotomy.  The head was delivered easily followed by the shoulders and body.  After a 60 second delay per protocol, the cord was clamped and cut and the infant was passed to the waiting neonatologist.  The placenta was then delivered spontaneously, intact and appear normal, the uterus was cleared of all clot and debris   The hysterotomy was repaired with #0 Monocryl in running locked fashion.  A second imbricating layer of #0 Monocryl was placed. Excellent hemostasis was noted.  The Alexis retractor was removed from the abdomen. The peritoneum was examined and all vessels noted to be hemostatic. The abdominal cavity was cleared of all clot and debris.  The peritoneum was closed with 2-0 vicryl in a running fashion.  The rectus muscles were then closed with 2-0 Vicryl. The fascia and rectus muscles were inspected and were hemostatic. The fascia was closed with 0 Vicryl in a running fashion. The subcutaneous layer was irrigated and all bleeders cauterized. The subcutaneous layer was closed with interrupted plain gut. The skin was closed with 3-0 monocryl in a subcuticular fashion. The incision was dressed with benzoine, steri strips and honeycomb dressing. All sponge lap and needle counts were correct x3. Patient tolerated the procedure well and recovered in stable condition following the procedure.

## 2019-08-13 ENCOUNTER — Encounter: Payer: 59 | Admitting: Physical Therapy

## 2019-08-13 LAB — CBC
HCT: 29.3 % — ABNORMAL LOW (ref 36.0–46.0)
Hemoglobin: 9.6 g/dL — ABNORMAL LOW (ref 12.0–15.0)
MCH: 29.5 pg (ref 26.0–34.0)
MCHC: 32.8 g/dL (ref 30.0–36.0)
MCV: 90.2 fL (ref 80.0–100.0)
Platelets: 149 10*3/uL — ABNORMAL LOW (ref 150–400)
RBC: 3.25 MIL/uL — ABNORMAL LOW (ref 3.87–5.11)
RDW: 14.3 % (ref 11.5–15.5)
WBC: 14.5 10*3/uL — ABNORMAL HIGH (ref 4.0–10.5)
nRBC: 0 % (ref 0.0–0.2)

## 2019-08-13 LAB — BIRTH TISSUE RECOVERY COLLECTION (PLACENTA DONATION)

## 2019-08-13 NOTE — Progress Notes (Signed)
POD#1 Pt without complaints. Lochia- mild VSSAF Hgb-9.6 IMP/ Stable Plan/ Routine care

## 2019-08-14 ENCOUNTER — Other Ambulatory Visit (HOSPITAL_COMMUNITY)
Admission: RE | Admit: 2019-08-14 | Discharge: 2019-08-14 | Disposition: A | Discharge status: Home / Self Care | Payer: 59 | Source: Ambulatory Visit | Attending: Obstetrics and Gynecology | Admitting: Obstetrics and Gynecology

## 2019-08-14 MED ORDER — FERROUS SULFATE 325 (65 FE) MG PO TABS: 325.0000 mg | ORAL_TABLET | Freq: Every day | ORAL | 11 refills | Status: DC

## 2019-08-14 MED ORDER — DOCUSATE SODIUM 50 MG PO CAPS
50.0000 mg | ORAL_CAPSULE | Freq: Two times a day (BID) | ORAL | 0 refills | Status: DC
Start: 2019-08-14 — End: 2019-08-28

## 2019-08-14 MED ORDER — SIMETHICONE 80 MG PO CHEW: 80.0000 mg | CHEWABLE_TABLET | ORAL | 0 refills | Status: DC | PRN

## 2019-08-14 MED ORDER — OXYCODONE-ACETAMINOPHEN 5-325 MG PO TABS: 1.0000 | ORAL_TABLET | Freq: Four times a day (QID) | ORAL | 0 refills | Status: DC | PRN

## 2019-08-14 MED ORDER — IBUPROFEN 800 MG PO TABS: 800.0000 mg | ORAL_TABLET | Freq: Three times a day (TID) | ORAL | 0 refills | Status: DC | PRN

## 2019-08-14 NOTE — Discharge Summary (Signed)
   Postpartum Discharge Summary    Patient Name: Carla Gonzalez DOB: 07/05/1985 MRN: [id number]  Date of admission: 08/12/2019 Delivery date:08/12/2019  Delivering provider: CLARK, DYANNA  Date of discharge: 08/14/2019  Admitting diagnosis: Cesarean delivery due to previous difficult delivery, delivered, current hospitalization [O82, Z87.59] Fetal malpresentation, delivered, current hospitalization [O32.9XX0] Intrauterine pregnancy: [redacted]w[redacted]d     Secondary diagnosis:  Active Problems:   Cesarean delivery due to previous difficult delivery, delivered, current hospitalization   Fetal malpresentation, delivered, current hospitalization  Additional problems: Possible fetal ventriculomegaly on antenatal US. Polyhydramnios.     Discharge diagnosis: Term Pregnancy Delivered                                              Post partum procedures:none Complications: None  Hospital course: Primary CS   34 y.o. yo G3P2012 at [redacted]w[redacted]d was admitted to the hospital 08/12/2019 for SROM, proceeded with cesarean section with the following indication:Malpresentation.Delivery details are as follows:  Membrane Rupture Time/Date: 3:00 AM ,08/12/2019   Delivery Method:C-Section, Low Transverse  Details of operation can be found in separate operative note.  Patient had an uncomplicated postpartum course.  She is ambulating, tolerating a regular diet, passing flatus, and urinating well. Patient is discharged home in stable condition on  08/14/19        Newborn Data: Birth date:08/12/2019  Birth time:7:48 AM  Gender:Female  Living status:Living  Apgars:6 ,7  Weight:4281 g     Magnesium Sulfate received: No BMZ received: No Rhophylac:N/A MMR:N/A T-DaP:Given prenatally Transfusion:No  Physical exam  Vitals:   08/13/19 1325 08/13/19 2200 08/14/19 0538 08/14/19 1432  BP: (!) 97/48 (!) 95/50 (!) 108/58 117/67  Pulse: (!) 59 65 65 72  Resp: 16 16 16 16  Temp: 98 F (36.7 C) 97.9 F (36.6 C) 97.6 F (36.4 C) 98.1  F (36.7 C)  TempSrc: Oral Oral Oral Oral  SpO2:  98%    Weight:       General: alert and no distress Lochia: appropriate Uterine Fundus: firm Incision: dressing dry DVT Evaluation: No evidence of DVT seen on physical exam. Labs: Lab Results  Component Value Date   WBC 14.5 (H) 08/13/2019   HGB 9.6 (L) 08/13/2019   HCT 29.3 (L) 08/13/2019   MCV 90.2 08/13/2019   PLT 149 (L) 08/13/2019   CMP Latest Ref Rng & Units 12/28/2018  Glucose 65 - 99 mg/dL 81  BUN 7 - 25 mg/dL 9  Creatinine 0.50 - 1.10 mg/dL 0.64  Sodium 135 - 146 mmol/L 137  Potassium 3.5 - 5.3 mmol/L 4.1  Chloride 98 - 110 mmol/L 103  CO2 20 - 32 mmol/L 24  Calcium 8.6 - 10.2 mg/dL 9.1  Total Protein 6.1 - 8.1 g/dL 7.1  Total Bilirubin 0.2 - 1.2 mg/dL 0.7  Alkaline Phos 38 - 126 U/L -  AST 10 - 30 U/L 13  ALT 6 - 29 U/L 9   Edinburgh Score: Edinburgh Postnatal Depression Scale Screening Tool 08/13/2019  I have been able to laugh and see the funny side of things. 0  I have looked forward with enjoyment to things. 0  I have blamed myself unnecessarily when things went wrong. 1  I have been anxious or worried for no good reason. 0  I have felt scared or panicky for no good reason. 0  Things have been getting on   top of me. 0  I have been so unhappy that I have had difficulty sleeping. 0  I have felt sad or miserable. 1  I have been so unhappy that I have been crying. 0  The thought of harming myself has occurred to me. 0  Edinburgh Postnatal Depression Scale Total 2      After visit meds:  Allergies as of 08/14/2019      Reactions   Sulfa Antibiotics Hives      Medication List    TAKE these medications   docusate sodium 50 MG capsule Commonly known as: COLACE Take 1 capsule (50 mg total) by mouth 2 (two) times daily.   ferrous sulfate 325 (65 FE) MG tablet Commonly known as: FerrouSul Take 1 tablet (325 mg total) by mouth daily with breakfast.   ibuprofen 800 MG tablet Commonly known as:  ADVIL Take 1 tablet (800 mg total) by mouth every 8 (eight) hours as needed.   oxyCODONE-acetaminophen 5-325 MG tablet Commonly known as: Percocet Take 1 tablet by mouth every 6 (six) hours as needed for severe pain.   PRENATAL VITAMIN PO Take 1 tablet by mouth daily.   simethicone 80 MG chewable tablet Commonly known as: MYLICON Chew 1 tablet (80 mg total) by mouth as needed for flatulence.            Discharge Care Instructions  (From admission, onward)         Start     Ordered   08/14/19 0000  Discharge wound care:    Comments: Remove dressing by postoperative day 7   08/14/19 1522           Discharge home in stable condition Infant Feeding: Bottle and Breast Infant Disposition:home with mother Discharge instruction: per After Visit Summary and Postpartum booklet. Activity: Advance as tolerated. Pelvic rest for 6 weeks.  Diet: routine diet Postpartum Appointment:4 weeks Future Appointments:No future appointments. Follow up Visit: Follow-up Information    Clark, Dyanna, MD. Schedule an appointment as soon as possible for a visit in 4 week(s).   Specialty: Obstetrics Contact information: [address]  [place] [address] [phone]               08/14/2019 Rosalea K Taam-Akelman, MD  

## 2019-08-14 NOTE — Progress Notes (Signed)
Subjective: Postpartum Day 2: Cesarean Delivery Patient reports incisional pain, tolerating PO, + flatus and no problems voiding.    Objective: Vital signs in last 24 hours: Temp:  [97.6 F (36.4 C)-98 F (36.7 C)] 97.6 F (36.4 C) (06/09 0538) Pulse Rate:  [59-65] 65 (06/09 0538) Resp:  [16] 16 (06/09 0538) BP: (95-108)/(48-58) 108/58 (06/09 0538) SpO2:  [98 %] 98 % (06/08 2200)  Physical Exam:  General: alert and no distress Lochia: appropriate Uterine Fundus: firm Incision: dressing dry DVT Evaluation: No evidence of DVT seen on physical exam.  Recent Labs    08/12/19 0552 08/13/19 0559  HGB 12.9 9.6*  HCT 38.9 29.3*    Assessment/Plan: Carla Gonzalez [id number] PPD#2 sp 1CS at [redacted]w[redacted]d due SROM/malpresentation 1. PPC: ordered abdominal binder for incision pain with standing, otherwise pain well controlled. Monitor pain today, possible discharge this afternoon vs. Tomorrow 2. ABLA: discharge home with PO iron Rubella immune, blood type O+, breast/bottlefeeding, baby girl in room  Carla Gonzalez K Taam-Akelman 08/14/2019, 9:37 AM

## 2019-08-14 NOTE — Discharge Instructions (Signed)

## 2019-08-16 ENCOUNTER — Inpatient Hospital Stay (HOSPITAL_COMMUNITY): Admission: RE | Admit: 2019-08-16 | Payer: 59 | Source: Home / Self Care | Admitting: Obstetrics & Gynecology

## 2019-08-18 ENCOUNTER — Emergency Department (HOSPITAL_BASED_OUTPATIENT_CLINIC_OR_DEPARTMENT_OTHER): Payer: 59

## 2019-08-18 ENCOUNTER — Other Ambulatory Visit: Payer: Self-pay

## 2019-08-18 ENCOUNTER — Encounter (HOSPITAL_BASED_OUTPATIENT_CLINIC_OR_DEPARTMENT_OTHER): Payer: Self-pay | Admitting: *Deleted

## 2019-08-18 ENCOUNTER — Inpatient Hospital Stay (HOSPITAL_BASED_OUTPATIENT_CLINIC_OR_DEPARTMENT_OTHER)
Admission: EM | Admit: 2019-08-18 | Discharge: 2019-08-18 | DRG: 776 | Disposition: A | Discharge status: Home / Self Care | Payer: 59 | Attending: Cardiology | Admitting: Cardiology

## 2019-08-18 ENCOUNTER — Other Ambulatory Visit: Payer: Self-pay | Admitting: Student

## 2019-08-18 ENCOUNTER — Inpatient Hospital Stay (HOSPITAL_COMMUNITY): Payer: 59

## 2019-08-18 DIAGNOSIS — Z20822 Contact with and (suspected) exposure to covid-19: Secondary | ICD-10-CM | POA: Diagnosis present

## 2019-08-18 DIAGNOSIS — Q2579 Other congenital malformations of pulmonary artery: Secondary | ICD-10-CM

## 2019-08-18 DIAGNOSIS — I340 Nonrheumatic mitral (valve) insufficiency: Secondary | ICD-10-CM | POA: Diagnosis not present

## 2019-08-18 DIAGNOSIS — Z882 Allergy status to sulfonamides status: Secondary | ICD-10-CM | POA: Diagnosis not present

## 2019-08-18 DIAGNOSIS — R001 Bradycardia, unspecified: Secondary | ICD-10-CM

## 2019-08-18 DIAGNOSIS — Z9889 Other specified postprocedural states: Secondary | ICD-10-CM | POA: Diagnosis not present

## 2019-08-18 DIAGNOSIS — I288 Other diseases of pulmonary vessels: Secondary | ICD-10-CM | POA: Diagnosis present

## 2019-08-18 DIAGNOSIS — I371 Nonrheumatic pulmonary valve insufficiency: Secondary | ICD-10-CM

## 2019-08-18 DIAGNOSIS — O9943 Diseases of the circulatory system complicating the puerperium: Principal | ICD-10-CM | POA: Diagnosis present

## 2019-08-18 DIAGNOSIS — Z8249 Family history of ischemic heart disease and other diseases of the circulatory system: Secondary | ICD-10-CM

## 2019-08-18 DIAGNOSIS — J9811 Atelectasis: Secondary | ICD-10-CM | POA: Diagnosis not present

## 2019-08-18 DIAGNOSIS — R0789 Other chest pain: Secondary | ICD-10-CM | POA: Diagnosis not present

## 2019-08-18 LAB — CBC WITH DIFFERENTIAL/PLATELET
Abs Immature Granulocytes: 0.06 10*3/uL (ref 0.00–0.07)
Basophils Absolute: 0.1 10*3/uL (ref 0.0–0.1)
Basophils Relative: 1 %
Eosinophils Absolute: 0.3 10*3/uL (ref 0.0–0.5)
Eosinophils Relative: 3 %
HCT: 35.2 % — ABNORMAL LOW (ref 36.0–46.0)
Hemoglobin: 11.8 g/dL — ABNORMAL LOW (ref 12.0–15.0)
Immature Granulocytes: 1 %
Lymphocytes Relative: 27 %
Lymphs Abs: 2.9 10*3/uL (ref 0.7–4.0)
MCH: 29.9 pg (ref 26.0–34.0)
MCHC: 33.5 g/dL (ref 30.0–36.0)
MCV: 89.3 fL (ref 80.0–100.0)
Monocytes Absolute: 0.6 10*3/uL (ref 0.1–1.0)
Monocytes Relative: 6 %
Neutro Abs: 6.9 10*3/uL (ref 1.7–7.7)
Neutrophils Relative %: 62 %
Platelets: 231 10*3/uL (ref 150–400)
RBC: 3.94 MIL/uL (ref 3.87–5.11)
RDW: 14.6 % (ref 11.5–15.5)
WBC: 10.8 10*3/uL — ABNORMAL HIGH (ref 4.0–10.5)
nRBC: 0 % (ref 0.0–0.2)

## 2019-08-18 LAB — COMPREHENSIVE METABOLIC PANEL
ALT: 20 U/L (ref 0–44)
AST: 23 U/L (ref 15–41)
Albumin: 3.4 g/dL — ABNORMAL LOW (ref 3.5–5.0)
Alkaline Phosphatase: 104 U/L (ref 38–126)
Anion gap: 13 (ref 5–15)
BUN: 18 mg/dL (ref 6–20)
CO2: 22 mmol/L (ref 22–32)
Calcium: 8.4 mg/dL — ABNORMAL LOW (ref 8.9–10.3)
Chloride: 106 mmol/L (ref 98–111)
Creatinine, Ser: 0.72 mg/dL (ref 0.44–1.00)
GFR calc Af Amer: >60 mL/min (ref >60)
GFR calc non Af Amer: >60 mL/min (ref >60)
Glucose, Bld: 89 mg/dL (ref 70–99)
Potassium: 3.8 mmol/L (ref 3.5–5.1)
Sodium: 141 mmol/L (ref 135–145)
Total Bilirubin: 0.8 mg/dL (ref 0.3–1.2)
Total Protein: 6.7 g/dL (ref 6.5–8.1)

## 2019-08-18 LAB — URINALYSIS, ROUTINE W REFLEX MICROSCOPIC
Specific Gravity, Urine: 1.02 (ref 1.005–1.030)
pH: 8 (ref 5.0–8.0)

## 2019-08-18 LAB — TROPONIN I (HIGH SENSITIVITY)
Troponin I (High Sensitivity): 9 ng/L (ref <18)
Troponin I (High Sensitivity): 10 ng/L (ref <18)

## 2019-08-18 LAB — TSH: TSH: 2.788 u[IU]/mL (ref 0.350–4.500)

## 2019-08-18 LAB — URINALYSIS, MICROSCOPIC (REFLEX): RBC / HPF: >50 RBC/hpf (ref 0–5)

## 2019-08-18 LAB — BRAIN NATRIURETIC PEPTIDE: B Natriuretic Peptide: 270.8 pg/mL — ABNORMAL HIGH (ref 0.0–100.0)

## 2019-08-18 LAB — ECHOCARDIOGRAM COMPLETE
Height: 67 in
Weight: 3504 oz

## 2019-08-18 LAB — HEMOGLOBIN A1C
Hgb A1c MFr Bld: 5.5 % (ref 4.8–5.6)
Mean Plasma Glucose: 111.15 mg/dL

## 2019-08-18 LAB — PROTIME-INR
INR: 0.9 (ref 0.8–1.2)
Prothrombin Time: 12.1 seconds (ref 11.4–15.2)

## 2019-08-18 LAB — SARS CORONAVIRUS 2 BY RT PCR (HOSPITAL ORDER, PERFORMED IN ~~LOC~~ HOSPITAL LAB): SARS Coronavirus 2: NEGATIVE

## 2019-08-18 MED ORDER — FUROSEMIDE 20 MG PO TABS
20.0000 mg | ORAL_TABLET | Freq: Every day | ORAL | 1 refills | Status: DC | PRN
Start: 2019-08-18 — End: 2019-08-28

## 2019-08-18 MED ORDER — IOHEXOL 350 MG/ML SOLN
100.0000 mL | Freq: Once | INTRAVENOUS | Status: AC | PRN
  Administered 2019-08-18: 100 mL via INTRAVENOUS

## 2019-08-18 MED ORDER — ACETAMINOPHEN 325 MG PO TABS: 650.0000 mg | ORAL_TABLET | ORAL | Status: DC | PRN

## 2019-08-18 MED ORDER — FUROSEMIDE 10 MG/ML IJ SOLN
20.0000 mg | Freq: Once | INTRAMUSCULAR | Status: AC
  Administered 2019-08-18: 20 mg via INTRAVENOUS
  Filled 2019-08-18: qty 2

## 2019-08-18 MED ORDER — IBUPROFEN 400 MG PO TABS
800.0000 mg | ORAL_TABLET | Freq: Three times a day (TID) | ORAL | Status: DC | PRN
  Administered 2019-08-18: 800 mg via ORAL
  Filled 2019-08-18: qty 2

## 2019-08-18 NOTE — Progress Notes (Signed)
Patient arrived from [facility] via care link , assessment completed see flow sheet, placed on tele ccmd notified, patient oriented to room and staff, bed in lowest position call bell within reach, ManUM MD notified of patient's arrival will continue to monitor.

## 2019-08-18 NOTE — ED Triage Notes (Addendum)
Pt states she had a baby by c-section on Monday. States she has had bilateral lower leg swelling since delivery. Pt. States she took her heart rate at home which her watched picked up a heart rate of 33. States she feels sob. States she is having heaviness in her chest. Sinus brady 40 on the monitor. Dr. Randal Buba aware of pt's symptoms. Pt has not notified her OB physician of symptoms. C-section incision is well approximated with steri strips in place.

## 2019-08-18 NOTE — ED Notes (Signed)
Report given to [facility] 

## 2019-08-18 NOTE — ED Notes (Signed)
MD with pt  

## 2019-08-18 NOTE — ED Notes (Addendum)
Report given to 4E, RN

## 2019-08-18 NOTE — Consult Note (Signed)
ELECTROPHYSIOLOGY CONSULT NOTE    Primary Care Physician: Elby Showers, MD Referring Physician:  Dr Kimber Relic  Admit Date: 08/18/2019  Reason for consultation:  bradycardia  Carla Gonzalez is a 34 y.o. female without significant PMH who is admitted with sinus bradycardia.  The patient is s/p C section (Epidural) this past Monday.  She reports that over the past 48 hours, she has had symptoms of bradycardia which include throat "fullness".   She now presents with sinus bradycardia with heart rates 30s-40s. + edema following her pregnancy Today, she denies symptoms of palpitations, shortness of breath, orthopnea, PND,  dizziness, presyncope, syncope, or neurologic sequela. The patient is tolerating medications without difficulties and is otherwise without complaint today.   Past Medical History:  Diagnosis Date  . Medical history non-contributory    Past Surgical History:  Procedure Laterality Date  . APPENDECTOMY    . CESAREAN SECTION N/A 08/12/2019   Procedure: CESAREAN SECTION;  Surgeon: Jerelyn Charles, MD;  Location: [facility];  Service: Obstetrics;  Laterality: N/A;  . Grommet    . TONSILLECTOMY AND ADENOIDECTOMY  2009       Allergies  Allergen Reactions  . Sulfa Antibiotics Hives    Social History   Socioeconomic History  . Marital status: Married    Spouse name: Not on file  . Number of children: Not on file  . Years of education: Not on file  . Highest education level: Not on file  Occupational History  . Not on file  Tobacco Use  . Smoking status: Never Smoker  . Smokeless tobacco: Never Used  Vaping Use  . Vaping Use: Never used  Substance and Sexual Activity  . Alcohol use: Not Currently  . Drug use: Never  . Sexual activity: Yes    Birth control/protection: None  Other Topics Concern  . Not on file  Social History Narrative  . Not on file   Social Determinants of Health   Financial Resource Strain:   . Difficulty of Paying Living Expenses:     Food Insecurity:   . Worried About Charity fundraiser in the Last Year:   . Arboriculturist in the Last Year:   Transportation Needs:   . Film/video editor (Medical):   Marland Kitchen Lack of Transportation (Non-Medical):   Physical Activity:   . Days of Exercise per Week:   . Minutes of Exercise per Session:   Stress:   . Feeling of Stress :   Social Connections:   . Frequency of Communication with Friends and Family:   . Frequency of Social Gatherings with Friends and Family:   . Attends Religious Services:   . Active Member of Clubs or Organizations:   . Attends Archivist Meetings:   Marland Kitchen Marital Status:   Intimate Partner Violence:   . Fear of Current or Ex-Partner:   . Emotionally Abused:   Marland Kitchen Physically Abused:   . Sexually Abused:     Family History  Problem Relation Age of Onset  . Hypertension Father     ROS- All systems are reviewed and negative except as per the HPI above  Physical Exam: Telemetry: Vitals:   08/18/19 0248 08/18/19 0357 08/18/19 0459 08/18/19 0759  BP: 114/80 (!) 135/58 128/69 125/62  Pulse: (!) 50 (!) 39 (!) 43 (!) 42  Resp: 20 (!) 21 17 16   Temp:  99.4 F (37.4 C) 98.8 F (37.1 C) 97.9 F (36.6 C)  TempSrc:  Oral Oral Oral  SpO2: 98% 99% 97% 96%  Weight:   99.3 kg   Height:   5\' 7"  (1.702 m)     GEN- The patient is well appearing, alert and oriented x 3 today.   Head- normocephalic, atraumatic Eyes-  Sclera clear, conjunctiva pink Ears- hearing intact Oropharynx- clear Neck- supple, no JVP Lungs-  normal work of breathing Heart- bradycardic rhythm GI- soft  Extremities- + edema MS- no significant deformity or atrophy Skin- no rash or lesion Psych- euthymic mood, full affect Neuro- strength and sensation are intact  EKG-  Sinus bradycardia, no ishcemic changes  Labs:   Lab Results  Component Value Date   WBC 10.8 (H) 08/18/2019   HGB 11.8 (L) 08/18/2019   HCT 35.2 (L) 08/18/2019   MCV 89.3 08/18/2019   PLT 231  08/18/2019    Recent Labs  Lab 08/18/19 0111  NA 141  K 3.8  CL 106  CO2 22  BUN 18  CREATININE 0.72  CALCIUM 8.4*  PROT 6.7  BILITOT 0.8  ALKPHOS 104  ALT 20  AST 23  GLUCOSE 89      Radiology: CXR and CT reviewed  Echo: pending  ASSESSMENT AND PLAN:   1. Sinus bradycardia Likely reactive with her recent pregnancy I suspect increased vagal tone. Echo and TSH are pending.  2. Acute diastolic dysfunction Possibly the cause for throat fullness Gentle diuresis with IV lasix  No indication for pacing at this time Avoid beta blockers  If echo and tsh are ok, hopefully she can go home later today   Thompson Grayer, MD 08/18/2019  8:09 AM

## 2019-08-18 NOTE — ED Provider Notes (Signed)
[facility] EMERGENCY DEPARTMENT Provider Note   CSN: [id number] Arrival date & time: 08/18/19  0043     History Chief Complaint  Patient presents with  . htn-post partum    Carla Gonzalez is a 34 y.o. female.  The history is provided by the patient.  Chest Pain Chest pain location: upper chest tightness  Pain quality: tightness   Pain radiates to:  Does not radiate Pain severity:  Moderate Onset quality:  Sudden Timing:  Constant Progression:  Waxing and waning Chronicity:  New Context: at rest   Relieved by:  Nothing Worsened by:  Nothing Ineffective treatments:  None tried Associated symptoms: shortness of breath   Associated symptoms: no abdominal pain, no back pain, no dizziness and no fever   Associated symptoms comment:  Bradycardia  Risk factors: no coronary artery disease, not female and no surgery   Risk factors comment:  Gave birth on 08/12/19      Past Medical History:  Diagnosis Date  . Medical history non-contributory     Patient Active Problem List   Diagnosis Date Noted  . Symptomatic bradycardia 08/18/2019  . Cesarean delivery due to previous difficult delivery, delivered, current hospitalization 08/12/2019  . Fetal malpresentation, delivered, current hospitalization 08/12/2019  . 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 APPENDECTOMY    . CESAREAN SECTION N/A 08/12/2019   Procedure: CESAREAN SECTION;  Surgeon: Jerelyn Charles, MD;  Location: [facility] LD ORS;  Service: Obstetrics;  Laterality: N/A;  . Grommet    . TONSILLECTOMY AND ADENOIDECTOMY  2009     OB History    Gravida  3   Para  2   Term  2   Preterm      AB  1   Living  2     SAB  1   TAB      Ectopic      Multiple  0   Live Births  2           Family History  Problem Relation Age of Onset  . Hypertension Father     Social History   Tobacco Use  . Smoking status: Never Smoker  . Smokeless  tobacco: Never Used  Vaping Use  . Vaping Use: Never used  Substance Use Topics  . Alcohol use: Not Currently  . Drug use: Never    Home Medications Prior to Admission medications   Medication Sig Start Date End Date Taking? Authorizing Provider  docusate sodium (COLACE) 50 MG capsule Take 1 capsule (50 mg total) by mouth 2 (two) times daily. 08/14/19   Taam-Akelman, Lawrence Santiago, MD  ferrous sulfate (FERROUSUL) 325 (65 FE) MG tablet Take 1 tablet (325 mg total) by mouth daily with breakfast. 08/14/19   Taam-Akelman, Lawrence Santiago, MD  ibuprofen (ADVIL) 800 MG tablet Take 1 tablet (800 mg total) by mouth every 8 (eight) hours as needed. 08/14/19   Taam-Akelman, Lawrence Santiago, MD  oxyCODONE-acetaminophen (PERCOCET) 5-325 MG tablet Take 1 tablet by mouth every 6 (six) hours as needed for severe pain. 08/14/19   Taam-Akelman, Lawrence Santiago, MD  Prenatal Vit-Fe Fumarate-FA (PRENATAL VITAMIN PO) Take 1 tablet by mouth daily.     [provider]  simethicone (MYLICON) 80 MG chewable tablet Chew 1 tablet (80 mg total) by mouth as needed for flatulence. 08/14/19   Jonelle Sidle, MD    Allergies    Sulfa antibiotics  Review of Systems  Review of Systems  Constitutional: Negative for fever.  HENT: Negative for congestion.   Eyes: Negative for visual disturbance.  Respiratory: Positive for shortness of breath.   Cardiovascular: Positive for chest pain.  Gastrointestinal: Negative for abdominal pain.  Genitourinary: Negative for difficulty urinating.  Musculoskeletal: Negative for back pain.  Neurological: Negative for dizziness.  Psychiatric/Behavioral: Negative for agitation.  All other systems reviewed and are negative.   Physical Exam Updated Vital Signs BP 114/80   Pulse (!) 50   Resp 20   Ht 5\' 7"  (1.702 m)   Wt 99.8 kg   LMP 11/12/2018 (Exact Date)   SpO2 98%   BMI 34.46 kg/m   Physical Exam Vitals and nursing note reviewed.  Constitutional:      Appearance: Normal  appearance.  HENT:     Head: Normocephalic and atraumatic.     Nose: Nose normal.     Mouth/Throat:     Mouth: Mucous membranes are moist.     Pharynx: Oropharynx is clear.  Eyes:     Extraocular Movements: Extraocular movements intact.     Conjunctiva/sclera: Conjunctivae normal.     Pupils: Pupils are equal, round, and reactive to light.  Cardiovascular:     Rate and Rhythm: Regular rhythm. Bradycardia present.     Pulses: Normal pulses.     Heart sounds: Normal heart sounds.  Pulmonary:     Effort: Pulmonary effort is normal.     Breath sounds: Normal breath sounds.  Abdominal:     General: Abdomen is flat. Bowel sounds are normal.     Tenderness: There is no abdominal tenderness. There is no guarding or rebound.  Musculoskeletal:        General: Normal range of motion.     Cervical back: Normal range of motion and neck supple.     Right lower leg: Edema present.     Left lower leg: Edema present.     Comments: Trace pedal edema   Skin:    General: Skin is warm and dry.     Capillary Refill: Capillary refill takes less than 2 seconds.  Neurological:     General: No focal deficit present.     Mental Status: She is alert and oriented to person, place, and time.     Deep Tendon Reflexes: Reflexes normal.  Psychiatric:        Mood and Affect: Mood normal.        Behavior: Behavior normal.     ED Results / Procedures / Treatments   Labs (all labs ordered are listed, but only abnormal results are displayed) Results for orders placed or performed during the hospital encounter of 08/18/19  CBC with Differential/Platelet  Result Value Ref Range   WBC 10.8 (H) 4.0 - 10.5 K/uL   RBC 3.94 3.87 - 5.11 MIL/uL   Hemoglobin 11.8 (L) 12.0 - 15.0 g/dL   HCT 35.2 (L) 36 - 46 %   MCV 89.3 80.0 - 100.0 fL   MCH 29.9 26.0 - 34.0 pg   MCHC 33.5 30.0 - 36.0 g/dL   RDW 14.6 11.5 - 15.5 %   Platelets 231 150 - 400 K/uL   nRBC 0.0 0.0 - 0.2 %   Neutrophils Relative % 62 %   Neutro  Abs 6.9 1.7 - 7.7 K/uL   Lymphocytes Relative 27 %   Lymphs Abs 2.9 0.7 - 4.0 K/uL   Monocytes Relative 6 %   Monocytes Absolute 0.6 0 - 1 K/uL   Eosinophils  Relative 3 %   Eosinophils Absolute 0.3 0 - 0 K/uL   Basophils Relative 1 %   Basophils Absolute 0.1 0 - 0 K/uL   Immature Granulocytes 1 %   Abs Immature Granulocytes 0.06 0.00 - 0.07 K/uL  Comprehensive metabolic panel  Result Value Ref Range   Sodium 141 135 - 145 mmol/L   Potassium 3.8 3.5 - 5.1 mmol/L   Chloride 106 98 - 111 mmol/L   CO2 22 22 - 32 mmol/L   Glucose, Bld 89 70 - 99 mg/dL   BUN 18 6 - 20 mg/dL   Creatinine, Ser 0.72 0.44 - 1.00 mg/dL   Calcium 8.4 (L) 8.9 - 10.3 mg/dL   Total Protein 6.7 6.5 - 8.1 g/dL   Albumin 3.4 (L) 3.5 - 5.0 g/dL   AST 23 15 - 41 U/L   ALT 20 0 - 44 U/L   Alkaline Phosphatase 104 38 - 126 U/L   Total Bilirubin 0.8 0.3 - 1.2 mg/dL   GFR calc non Af Amer >60 >60 mL/min   GFR calc Af Amer >60 >60 mL/min   Anion gap 13 5 - 15  Brain natriuretic peptide  Result Value Ref Range   B Natriuretic Peptide 270.8 (H) 0.0 - 100.0 pg/mL  Protime-INR  Result Value Ref Range   Prothrombin Time 12.1 11.4 - 15.2 seconds   INR 0.9 0.8 - 1.2  Urinalysis, Routine w reflex microscopic  Result Value Ref Range   Color, Urine RED (A) YELLOW   APPearance TURBID (A) CLEAR   Specific Gravity, Urine 1.020 1.005 - 1.030   pH 8.0 5.0 - 8.0   Glucose, UA (A) NEGATIVE mg/dL    TEST NOT REPORTED DUE TO COLOR INTERFERENCE OF URINE PIGMENT   Hgb urine dipstick (A) NEGATIVE    TEST NOT REPORTED DUE TO COLOR INTERFERENCE OF URINE PIGMENT   Bilirubin Urine (A) NEGATIVE    TEST NOT REPORTED DUE TO COLOR INTERFERENCE OF URINE PIGMENT   Ketones, ur (A) NEGATIVE mg/dL    TEST NOT REPORTED DUE TO COLOR INTERFERENCE OF URINE PIGMENT   Protein, ur (A) NEGATIVE mg/dL    TEST NOT REPORTED DUE TO COLOR INTERFERENCE OF URINE PIGMENT   Nitrite (A) NEGATIVE    TEST NOT REPORTED DUE TO COLOR INTERFERENCE OF URINE  PIGMENT   Leukocytes,Ua (A) NEGATIVE    TEST NOT REPORTED DUE TO COLOR INTERFERENCE OF URINE PIGMENT  Urinalysis, Microscopic (reflex)  Result Value Ref Range   RBC / HPF >50 0 - 5 RBC/hpf   WBC, UA 6-10 0 - 5 WBC/hpf   Bacteria, UA FEW (A) NONE SEEN   Squamous Epithelial / LPF 0-5 0 - 5  Troponin I (High Sensitivity)  Result Value Ref Range   Troponin I (High Sensitivity) 9 <18 ng/L   CT Angio Chest PE W and/or Wo Contrast  Result Date: 08/18/2019 CLINICAL DATA:  Recent C-section.  Lower extremity swelling. EXAM: CT ANGIOGRAPHY CHEST WITH CONTRAST TECHNIQUE: Multidetector CT imaging of the chest was performed using the standard protocol during bolus administration of intravenous contrast. Multiplanar CT image reconstructions and MIPs were obtained to evaluate the vascular anatomy. CONTRAST:  185mL OMNIPAQUE IOHEXOL 350 MG/ML SOLN COMPARISON:  None. FINDINGS: Cardiovascular: No filling defects in the pulmonary arteries to suggest pulmonary emboli. Heart is normal size. Aorta is normal caliber. Mediastinum/Nodes: No mediastinal, hilar, or axillary adenopathy. Trachea and esophagus are unremarkable. Thyroid unremarkable. Lungs/Pleura: Lungs are clear. No focal airspace opacities or suspicious nodules.  No effusions. Upper Abdomen: Imaging into the upper abdomen shows no acute findings. Musculoskeletal: Chest wall soft tissues are unremarkable. No acute bony abnormality. Pectus deformity of the sternum. Review of the MIP images confirms the above findings. IMPRESSION: No evidence of pulmonary embolus. No acute cardiopulmonary disease. Electronically Signed   By: Rolm Baptise M.D.   On: 08/18/2019 02:54   DG Chest Portable 1 View  Result Date: 08/18/2019 CLINICAL DATA:  Shortness of breath. EXAM: PORTABLE CHEST 1 VIEW COMPARISON:  None. FINDINGS: There is increased attenuation at the lung bases bilaterally. The heart size is enlarged. There is no pneumothorax. There is atelectasis at the lung bases.  There is no acute osseous abnormality. IMPRESSION: Mild cardiomegaly. There is increased attenuation at the lung bases which may be secondary to breast tissue attenuation artifact or small bilateral pleural effusions. Bibasilar atelectasis is noted. Electronically Signed   By: Constance Holster M.D.   On: 08/18/2019 01:43   [place] MFM OB DETAIL +14 WK  Result Date: 07/31/2019 ----------------------------------------------------------------------  OBSTETRICS REPORT                       (Signed Final 07/31/2019 12:37 pm) ---------------------------------------------------------------------- Patient Info  ID #:       [id number]                          D.O.B.:  [birth year] (33 yrs)  Name:       MIYA LUVIANO Petion            Visit Date: 07/31/2019 09:39 am ---------------------------------------------------------------------- Performed By  Attending:        Johnell Comings MD         Ref. Address:     [address]; [place] [address]  Performed By:     Felecia Jan        [facility]  Referred By:      Olga Millers MD ---------------------------------------------------------------------- Orders  #  Description                           Code        Ordered By  1  [place] MFM OB DETAIL +14 WK               71696.78    Freda Munro ----------------------------------------------------------------------  #  Order #                     Accession #  Episode #  1  [id number]                   [id number]                 [id number] ---------------------------------------------------------------------- Indications  Polyhydramnios, third trimester, antepartum    O40.3XX1  condition or complication, fetus 1  Macrosomia                                     O36.60X0  [redacted] weeks gestation of pregnancy                Z3A.37  Encounter for antenatal screening  for          Z36.3  malformations  Fetal abnormality - other known or             O35.9XX0  suspected (specify) ventriculomegaly  Poor obstetric history: Prior fetal            O09.299  macrosomia, antepartum ---------------------------------------------------------------------- Fetal Evaluation  Num Of Fetuses:         1  Fetal Heart Rate(bpm):  145  Cardiac Activity:       Observed  Presentation:           Transverse, head to maternal left  Placenta:               Anterior Fundal  P. Cord Insertion:      Not well visualized  Amniotic Fluid  AFI FV:      Polyhydramnios  AFI Sum(cm)     %Tile       Largest Pocket(cm)  30.5            > 97        10.6  RUQ(cm)       RLQ(cm)       LUQ(cm)        LLQ(cm)  10.3          7.1           10.6           2.5 ---------------------------------------------------------------------- Biometry  BPD:     103.1  mm     G. Age:  42w 4d       > 99  %    CI:        78.26   %    70 - 86                                                          FL/HC:      19.9   %    20.8 - 22.6  HC:      368.7  mm     G. Age:  43w 6d       > 99  %    HC/AC:      1.05        0.92 - 1.05  AC:      351.9  mm     G. Age:  39w 1d         96  %    FL/BPD:     71.3   %    71 - 87  FL:  73.5  mm     G. Age:  37w 4d         59  %    FL/AC:      20.9   %    20 - 24  HUM:      64.7  mm     G. Age:  37w 4d         77  %  LV:       10.9  mm  Est. FW:    3886  gm      8 lb 9 oz     98  % ---------------------------------------------------------------------- Gestational Age  LMP:           37w 2d        Date:  11/12/18                 EDD:   08/19/19  U/S Today:     40w 6d                                        EDD:   07/25/19  Best:          37w 2d     Det. By:  LMP  (11/12/18)          EDD:   08/19/19 ---------------------------------------------------------------------- Anatomy  Cranium:               Appears normal         Aortic Arch:            Not well visualized  Cavum:                 Appears normal          Ductal Arch:            Not well visualized  Ventricles:            Bilat.ventriculomeg    Diaphragm:              Appears normal                         aly,  Rt=11   Lt=  Choroid Plexus:        Appears normal         Stomach:                Appears normal, left                                                                        sided  Cerebellum:            Not well visualized    Abdomen:                Appears normal  Posterior Fossa:       Not well visualized    Abdominal Wall:         Appears nml (cord  insert, abd wall)  Nuchal Fold:           Not applicable (>55    Cord Vessels:           Appears normal ([redacted]                         wks GA)                                        vessel cord)  Face:                  Appears normal         Kidneys:                Appear normal                         (orbits and profile)  Lips:                  Appears normal         Bladder:                Appears normal  Thoracic:              Appears normal         Spine:                  Not well visualized  Heart:                 Appears normal         Upper Extremities:      Appears normal                         (4CH, axis, and                         situs)  RVOT:                  Appears normal         Lower Extremities:      Appears normal  LVOT:                  Appears normal  Other:  Nasal bone visualized. Female gender ---------------------------------------------------------------------- Cervix Uterus Adnexa  Cervix  Not visualized (advanced GA >24wks)  Right Ovary  Not visualized.  Left Ovary  Not visualized. ---------------------------------------------------------------------- Comments  This patient was seen for an ultrasound and consultation due  to fetal macrosomia and ventriculomegaly noted in the fetal  brain on an ultrasound performed in your office yesterday.  The patient reports that she has screened negative for  gestational  diabetes in her current pregnancy.  She reports  that she gave birth to a 9 pound 7 ounce baby in her first  pregnancy.  She did encounter shoulder dystocia and  required an episiotomy and had a perineal laceration during  that delivery.  She reports that her prior ultrasounds  performed in your office have all been within normal limits.  She denies any other significant past medical history.  Due to  ventriculomegaly noted yesterday, she had CMV testing that  did not show an acute infection.  She reports that she had a negative screening test for fetal  aneuploidy drawn in her current pregnancy.  On today's exam, the overall EFW measures 8 pounds 9  ounces which is at the [place] percentile for her gestational age.  Polyhydramnios with a total AFI of 30.5 cm is noted.  The views of the fetal anatomy were limited today due to her  advanced gestational age.  The lateral ventricles in the fetal brain were difficult to assess  due to the fetal position.  Possible ventriculomegaly  measuring 1.1 to 1.3 cm dilated was noted.  However due to  the fetal position, it is questionable if the lateral ventricles  were measured in the correct plane.  I could not get the fetus  to be in the right position for an accurate measurement of the  lateral ventricles.  She was reassured that most cases of mild  ventriculomegaly noted at term have good outcomes.  Her  baby should be examined and have a head ultrasound after  birth to determine if ventriculomegaly is present.  Due to polyhydramnios noted today, she should start weekly  fetal testing in your office and continue these tests until  delivery.  I would recommend that she undergo an induction  at around 39 weeks.  Based on the EFW obtained today, she was advised that this  baby will most likely be of similar weight to her first baby if  she is delivered at around 39 weeks.  Based on current  ACOG recommendations, a cesarean delivery would be  recommended in someone without diabetes  if the EFW was  5000 g or greater.  A cesarean delivery may be considered if  she wants to do everything possible to avoid another  shoulder dystocia or if the fetus remains in the  breech/transverse presentation.  At the end of the consultation, the patient stated that all of her  questions had been answered to her complete satisfaction.  Thank you for referring this patient for a Maternal-Fetal  Medicine consultation.  A total of 30 minutes was spent counseling and coordinating  the care for this patient.  Greater than 50% of the time was  spent in direct face-to-face contact. ----------------------------------------------------------------------                   Johnell Comings, MD Electronically Signed Final Report   07/31/2019 12:37 pm ----------------------------------------------------------------------   EKG  Date: 08/18/2019  Rate: 42  Rhythm: bradycardia  QRS Axis: normal  Intervals: normal  ST/T Wave abnormalities: normal  Conduction Disutrbances: none  Narrative Interpretation: brady arrhythmia      Radiology CT Angio Chest PE W and/or Wo Contrast  Result Date: 08/18/2019 CLINICAL DATA:  Recent C-section.  Lower extremity swelling. EXAM: CT ANGIOGRAPHY CHEST WITH CONTRAST TECHNIQUE: Multidetector CT imaging of the chest was performed using the standard protocol during bolus administration of intravenous contrast. Multiplanar CT image reconstructions and MIPs were obtained to evaluate the vascular anatomy. CONTRAST:  122mL OMNIPAQUE IOHEXOL 350 MG/ML SOLN COMPARISON:  None. FINDINGS: Cardiovascular: No filling defects in the pulmonary arteries to suggest pulmonary emboli. Heart is normal size. Aorta is normal caliber. Mediastinum/Nodes: No mediastinal, hilar, or axillary adenopathy. Trachea and esophagus are unremarkable. Thyroid unremarkable. Lungs/Pleura: Lungs are clear. No focal airspace opacities or suspicious nodules. No effusions. Upper Abdomen: Imaging into the upper abdomen shows  no acute findings. Musculoskeletal: Chest wall soft tissues are unremarkable. No acute bony abnormality. Pectus deformity of the sternum. Review of the MIP images confirms the above findings. IMPRESSION: No evidence of pulmonary embolus. No  acute cardiopulmonary disease. Electronically Signed   By: Rolm Baptise M.D.   On: 08/18/2019 02:54   DG Chest Portable 1 View  Result Date: 08/18/2019 CLINICAL DATA:  Shortness of breath. EXAM: PORTABLE CHEST 1 VIEW COMPARISON:  None. FINDINGS: There is increased attenuation at the lung bases bilaterally. The heart size is enlarged. There is no pneumothorax. There is atelectasis at the lung bases. There is no acute osseous abnormality. IMPRESSION: Mild cardiomegaly. There is increased attenuation at the lung bases which may be secondary to breast tissue attenuation artifact or small bilateral pleural effusions. Bibasilar atelectasis is noted. Electronically Signed   By: Constance Holster M.D.   On: 08/18/2019 01:43    Procedures Procedures (including critical care time)  Medications Ordered in ED Medications  iohexol (OMNIPAQUE) 350 MG/ML injection 100 mL (100 mLs Intravenous Contrast Given 08/18/19 0228)    ED Course  I have reviewed the triage vital signs and the nursing notes.  Pertinent labs & imaging results that were available during my care of the patient were reviewed by me and considered in my medical decision making (see chart for details).    Patient continues to be bradycardiac, states when HR drops into the 40s she pressure in the upper chest/neck returns.    215 Case d/w Dr. Philis Pique of OB GYN BP normalized and she does not believe this is preeclampsia.  Do not start magnesium at this time.  GYN will consult.  Place consult   320 Case d/w Cardiology, Dr. Einar Gip, who will accept the patient to step down, please keep NPO.    Final Clinical Impression(s) / ED Diagnoses Final diagnoses:  Symptomatic bradycardia   Admit to step down      Sajan Cheatwood, MD 08/18/19 2330

## 2019-08-18 NOTE — Discharge Summary (Addendum)
Discharge Summary    Patient ID: Carla Gonzalez MRN: [id number]; DOB: Mar 30, 1985  Admit date: 08/18/2019 Discharge date: 08/18/2019  Primary Care Provider: Elby Showers, MD  Primary Cardiologist: No primary care provider on file.  Primary Electrophysiologist:  None   Discharge Diagnoses    Active Problems:   Symptomatic bradycardia   Dilated pulmonary artery  Diagnostic Studies/Procedures    Echocardiogram 08/18/2019: Impressions: 1. Left ventricular ejection fraction, by estimation, is 60 to 65%. The  left ventricle has normal function. The left ventricle has no regional  wall motion abnormalities. Left ventricular diastolic parameters were  normal.  2. Right ventricular systolic function is normal. The right ventricular  size is normal.  3. The mitral valve is normal in structure. Mild mitral valve  regurgitation.  4. The aortic valve is normal in structure. Aortic valve regurgitation is  not visualized.  5. Main PA dilated at 34.5 mm.  6. The inferior vena cava is dilated in size with >50% respiratory  variability, suggesting right atrial pressure of 8 mmHg.  _____________   History of Present Illness     Carla Gonzalez is a 34 y.o. female with without significant PMH. She has recently given birth via C-section (obtained epidural) to a baby girl on Monday. She is an Training and development officer. She has been experiencing episodes of chest tightness associated with bradycardia for 2 days ago prior to presentation. She noticed her symptoms 08/16/2019 night and her symptoms reoccured the night of 08/17/2019. She noticed chest tightness radiating to her jaw. She noticed heart rate dropping to the 30's on her Apple watch; therefore, shed presented to [facility]. Patient denies taking any beta blockers and herbal medications. She only takes prenatal vitamins. She denies any family history of CAD, arrhythmias. She denies any thyroid abnormalities,  neurological changes, fevers, chills, night sweats, urinary discomfort, purulence at C-section site, lightheadedness or syncope.  In the ED: EKG showed sinus bradycardia with sinus arrhythmia. Rate 42 bpm. High-sensitivity troponin negative. BNP mildly elevated at 270. Chest x-ray showed mild cardiomegaly with increased attenuation at the lung bases (which may be breast tissues attenuation artifact or small bilateral pleural effusion) and bibasilar atelectasis. Chest CTA negative for PE but did note dilated main pulmonary artery with diameter of 3.8cm. WBC 10.8, Hgb 11.8, Plts 231. Na 141, K 3.8, Glucose 89, BUN 18, Cr 0.72. CTA for PE was negative. She was admitted to [facility] for further evaluation.  Hospital Course     Consultants: EP  Patient admitted for symptomatic bradycardia as stated above. She was seen by Dr. Rayann Heman with EP who felt like this was likely due to increased vagal tone with recent pregnancy. TSH normal. She was given one dose of IV Lasix 20mg  with improvement in throat fullness sensation. Thinking back, patient states she did receive a lot of IV fluids after delivery. Echo showed LVEF of 60-65% with normal wall motion and norma diastolic function. RV also normal. Main pulmonary artery dilated at 34.53mm. Dr. Harrington Challenger, reading physician, discussed Echo results with Dr. Haroldine Laws and Dr. Rayann Heman and all felt this was likely due to increased volume/fluid in post-partum phase. Therefore, repeat Echo recommended in 6-8 weeks. I will place this order. She was felt to be stable for discharge. Will provide PRN Lasix for shortness of breath or edema. Assuming repeat Echo normal, she can follow-up as needed.  Did the patient have an acute coronary syndrome (MI, NSTEMI, STEMI, etc) this admission?:  No  Did the patient have a percutaneous coronary intervention (stent / angioplasty)?:  No.   _____________  Discharge Vitals Blood pressure 140/75, pulse (!) 49,  temperature 98.1 F (36.7 C), temperature source Oral, resp. rate 14, height 5\' 7"  (1.702 m), weight 99.3 kg, last menstrual period 11/12/2018, SpO2 99 %, unknown if currently breastfeeding.  Filed Weights   08/18/19 0056 08/18/19 0459  Weight: 99.8 kg 99.3 kg    Labs & Radiologic Studies    CBC Recent Labs    08/18/19 0111  WBC 10.8*  NEUTROABS 6.9  HGB 11.8*  HCT 35.2*  MCV 89.3  PLT 992   Basic Metabolic Panel Recent Labs    08/18/19 0111  NA 141  K 3.8  CL 106  CO2 22  GLUCOSE 89  BUN 18  CREATININE 0.72  CALCIUM 8.4*   Liver Function Tests Recent Labs    08/18/19 0111  AST 23  ALT 20  ALKPHOS 104  BILITOT 0.8  PROT 6.7  ALBUMIN 3.4*   No results for input(s): LIPASE, AMYLASE in the last 72 hours. High Sensitivity Troponin:   Recent Labs  Lab 08/18/19 0113 08/18/19 0308  TROPONINIHS 9 10    BNP Invalid input(s): POCBNP D-Dimer No results for input(s): DDIMER in the last 72 hours. Hemoglobin A1C Recent Labs    08/18/19 1110  HGBA1C 5.5   Fasting Lipid Panel No results for input(s): CHOL, HDL, LDLCALC, TRIG, CHOLHDL, LDLDIRECT in the last 72 hours. Thyroid Function Tests Recent Labs    08/18/19 1110  TSH 2.788   _____________  CT Angio Chest PE W and/or Wo Contrast  Addendum Date: 08/18/2019   ADDENDUM REPORT: 08/18/2019 16:55 ADDENDUM: Cardiologist Dr. Harrington Challenger called and asked for assessment of pulmonary artery caliber. Dilated main pulmonary artery with diameter 3.8 cm. These addended results were called by telephone at the time of interpretation on 08/18/2019 at 4:45 pm to provider DR. ROSS, who verbally acknowledged these results. Electronically Signed   By: Ilona Sorrel M.D.   On: 08/18/2019 16:55   Result Date: 08/18/2019 CLINICAL DATA:  Recent C-section.  Lower extremity swelling. EXAM: CT ANGIOGRAPHY CHEST WITH CONTRAST TECHNIQUE: Multidetector CT imaging of the chest was performed using the standard protocol during bolus administration  of intravenous contrast. Multiplanar CT image reconstructions and MIPs were obtained to evaluate the vascular anatomy. CONTRAST:  180mL OMNIPAQUE IOHEXOL 350 MG/ML SOLN COMPARISON:  None. FINDINGS: Cardiovascular: No filling defects in the pulmonary arteries to suggest pulmonary emboli. Heart is normal size. Aorta is normal caliber. Mediastinum/Nodes: No mediastinal, hilar, or axillary adenopathy. Trachea and esophagus are unremarkable. Thyroid unremarkable. Lungs/Pleura: Lungs are clear. No focal airspace opacities or suspicious nodules. No effusions. Upper Abdomen: Imaging into the upper abdomen shows no acute findings. Musculoskeletal: Chest wall soft tissues are unremarkable. No acute bony abnormality. Pectus deformity of the sternum. Review of the MIP images confirms the above findings. IMPRESSION: No evidence of pulmonary embolus. No acute cardiopulmonary disease. Electronically Signed: By: Rolm Baptise M.D. On: 08/18/2019 02:54   DG Chest Portable 1 View  Result Date: 08/18/2019 CLINICAL DATA:  Shortness of breath. EXAM: PORTABLE CHEST 1 VIEW COMPARISON:  None. FINDINGS: There is increased attenuation at the lung bases bilaterally. The heart size is enlarged. There is no pneumothorax. There is atelectasis at the lung bases. There is no acute osseous abnormality. IMPRESSION: Mild cardiomegaly. There is increased attenuation at the lung bases which may be secondary to breast tissue attenuation artifact or small bilateral pleural effusions. Bibasilar  atelectasis is noted. Electronically Signed   By: Constance Holster M.D.   On: 08/18/2019 01:43   [place] MFM OB DETAIL +14 WK  Result Date: 07/31/2019 ----------------------------------------------------------------------  OBSTETRICS REPORT                       (Signed Final 07/31/2019 12:37 pm) ---------------------------------------------------------------------- Patient Info  ID #:       [id number]                          D.O.B.:  Jul 18, 1985 (33 yrs)  Name:        JOLEIGH MINEAU Palm            Visit Date: 07/31/2019 09:39 am ---------------------------------------------------------------------- Performed By  Attending:        Johnell Comings MD         Ref. Address:     [address] [place] [address]  Performed By:     Felecia Jan        [facility]  Referred By:      Olga Millers MD ---------------------------------------------------------------------- Orders  #  Description                           Code        Ordered By  1  [place] MFM OB DETAIL +14 [place]               [id number]    Freda Munro ----------------------------------------------------------------------  #  Order #                     Accession #                Episode #  1  [id number]                   [id number]                 [id number] ---------------------------------------------------------------------- Indications  Polyhydramnios, third trimester, antepartum    O40.3XX1  condition or complication, fetus 1  Macrosomia                                     O36.60X0  [redacted] weeks gestation of pregnancy                Z3A.37  Encounter for antenatal screening for          Z36.3  malformations  Fetal abnormality -  other known or             O35.9XX0  suspected (specify) ventriculomegaly  Poor obstetric history: Prior fetal            O09.299  macrosomia, antepartum ---------------------------------------------------------------------- Fetal Evaluation  Num Of Fetuses:         1  Fetal Heart Rate(bpm):  145  Cardiac Activity:       Observed  Presentation:           Transverse, head to maternal left  Placenta:               Anterior Fundal  P. Cord Insertion:      Not well visualized  Amniotic Fluid  AFI FV:      Polyhydramnios  AFI Sum(cm)     %Tile       Largest Pocket(cm)  30.5            > 97        10.6  RUQ(cm)       RLQ(cm)       LUQ(cm)         LLQ(cm)  10.3          7.1           10.6           2.5 ---------------------------------------------------------------------- Biometry  BPD:     103.1  mm     G. Age:  42w 4d       > 99  %    CI:        78.26   %    70 - 86                                                          FL/HC:      19.9   %    20.8 - 22.6  HC:      368.7  mm     G. Age:  43w 6d       > 99  %    HC/AC:      1.05        0.92 - 1.05  AC:      351.9  mm     G. Age:  39w 1d         96  %    FL/BPD:     71.3   %    71 - 87  FL:       73.5  mm     G. Age:  37w 4d         59  %    FL/AC:      20.9   %    20 - 24  HUM:      64.7  mm     G. Age:  37w 4d         77  %  LV:       10.9  mm  Est. FW:    3886  gm      8 lb 9 oz     98  % ---------------------------------------------------------------------- Gestational Age  LMP:           37w 2d        Date:  11/12/18  EDD:   08/19/19  U/S Today:     40w 6d                                        EDD:   07/25/19  Best:          37w 2d     Det. By:  LMP  (11/12/18)          EDD:   08/19/19 ---------------------------------------------------------------------- Anatomy  Cranium:               Appears normal         Aortic Arch:            Not well visualized  Cavum:                 Appears normal         Ductal Arch:            Not well visualized  Ventricles:            Bilat.ventriculomeg    Diaphragm:              Appears normal                         aly,  Rt=11   Lt=  Choroid Plexus:        Appears normal         Stomach:                Appears normal, left                                                                        sided  Cerebellum:            Not well visualized    Abdomen:                Appears normal  Posterior Fossa:       Not well visualized    Abdominal Wall:         Appears nml (cord                                                                        insert, abd wall)  Nuchal Fold:           Not applicable (>09    Cord Vessels:           Appears normal ([redacted]                          wks GA)                                        vessel cord)  Face:  Appears normal         Kidneys:                Appear normal                         (orbits and profile)  Lips:                  Appears normal         Bladder:                Appears normal  Thoracic:              Appears normal         Spine:                  Not well visualized  Heart:                 Appears normal         Upper Extremities:      Appears normal                         (4CH, axis, and                         situs)  RVOT:                  Appears normal         Lower Extremities:      Appears normal  LVOT:                  Appears normal  Other:  Nasal bone visualized. Female gender ---------------------------------------------------------------------- Cervix Uterus Adnexa  Cervix  Not visualized (advanced GA >24wks)  Right Ovary  Not visualized.  Left Ovary  Not visualized. ---------------------------------------------------------------------- Comments  This patient was seen for an ultrasound and consultation due  to fetal macrosomia and ventriculomegaly noted in the fetal  brain on an ultrasound performed in your office yesterday.  The patient reports that she has screened negative for  gestational diabetes in her current pregnancy.  She reports  that she gave birth to a 9 pound 7 ounce baby in her first  pregnancy.  She did encounter shoulder dystocia and  required an episiotomy and had a perineal laceration during  that delivery.  She reports that her prior ultrasounds  performed in your office have all been within normal limits.  She denies any other significant past medical history.  Due to  ventriculomegaly noted yesterday, she had CMV testing that  did not show an acute infection.  She reports that she had a negative screening test for fetal  aneuploidy drawn in her current pregnancy.  On today's exam, the overall EFW measures 8 pounds 9  ounces which is at the Marshall percentile for  her gestational age.  Polyhydramnios with a total AFI of 30.5 cm is noted.  The views of the fetal anatomy were limited today due to her  advanced gestational age.  The lateral ventricles in the fetal brain were difficult to assess  due to the fetal position.  Possible ventriculomegaly  measuring 1.1 to 1.3 cm dilated was noted.  However due to  the fetal position, it is questionable if the lateral ventricles  were measured in the correct plane.  I could not get the fetus  to be in the right  position for an accurate measurement of the  lateral ventricles.  She was reassured that most cases of mild  ventriculomegaly noted at term have good outcomes.  Her  baby should be examined and have a head ultrasound after  birth to determine if ventriculomegaly is present.  Due to polyhydramnios noted today, she should start weekly  fetal testing in your office and continue these tests until  delivery.  I would recommend that she undergo an induction  at around 39 weeks.  Based on the EFW obtained today, she was advised that this  baby will most likely be of similar weight to her first baby if  she is delivered at around 39 weeks.  Based on current  ACOG recommendations, a cesarean delivery would be  recommended in someone without diabetes if the EFW was  5000 g or greater.  A cesarean delivery may be considered if  she wants to do everything possible to avoid another  shoulder dystocia or if the fetus remains in the  breech/transverse presentation.  At the end of the consultation, the patient stated that all of her  questions had been answered to her complete satisfaction.  Thank you for referring this patient for a Maternal-Fetal  Medicine consultation.  A total of 30 minutes was spent counseling and coordinating  the care for this patient.  Greater than 50% of the time was  spent in direct face-to-face contact. ----------------------------------------------------------------------                   Johnell Comings, MD  Electronically Signed Final Report   07/31/2019 12:37 pm ----------------------------------------------------------------------  ECHOCARDIOGRAM COMPLETE  Result Date: 08/18/2019    ECHOCARDIOGRAM REPORT   Patient Name:   LIZZET HENDLEY Date of Exam: 08/18/2019 Medical Rec #:  [id number]            Height:       67.0 in Accession #:    [id number]           Weight:       219.0 lb Date of Birth:  1985/04/09             BSA:          2.102 m Patient Age:    33 years             BP:           125/62 mmHg Patient Gender: F                    HR:           42 bpm. Exam Location:  Inpatient Procedure: 2D Echo, Cardiac Doppler and Color Doppler Indications:    Abnormal ECG 794.31 / R94.31  History:        Patient has no prior history of Echocardiogram examinations.                 Symptomatic bradycardia, S/P Cesarean delivery due to previous                 difficult delivery, delivered, current hospitalization.  Sonographer:    Alvino Chapel RCS Referring Phys: [id number] [place]  1. Left ventricular ejection fraction, by estimation, is 60 to 65%. The left ventricle has normal function. The left ventricle has no regional wall motion abnormalities. Left ventricular diastolic parameters were normal.  2. Right ventricular systolic function is normal. The right ventricular size is normal.  3. The mitral valve is normal in  structure. Mild mitral valve regurgitation.  4. The aortic valve is normal in structure. Aortic valve regurgitation is not visualized.  5. Main PA dilated at 34.5 mm.  6. The inferior vena cava is dilated in size with >50% respiratory variability, suggesting right atrial pressure of 8 mmHg. FINDINGS  Left Ventricle: Left ventricular ejection fraction, by estimation, is 60 to 65%. The left ventricle has normal function. The left ventricle has no regional wall motion abnormalities. The left ventricular internal cavity size was normal in size. There is  no left ventricular hypertrophy. Left  ventricular diastolic parameters were normal. Right Ventricle: The right ventricular size is normal. No increase in right ventricular wall thickness. Right ventricular systolic function is normal. Left Atrium: Left atrial size was normal in size. Right Atrium: Right atrial size was normal in size. Pericardium: There is no evidence of pericardial effusion. Mitral Valve: The mitral valve is normal in structure. Mild mitral valve regurgitation. Tricuspid Valve: The tricuspid valve is normal in structure. Tricuspid valve regurgitation is trivial. Aortic Valve: The aortic valve is normal in structure. Aortic valve regurgitation is not visualized. Pulmonic Valve: The pulmonic valve was normal in structure. Pulmonic valve regurgitation is mild. Aorta: The aortic root is normal in size and structure. Pulmonary Artery: Main PA dilated at 34.5 mm. The pulmonary artery is mildly dilated. Venous: The inferior vena cava is dilated in size with greater than 50% respiratory variability, suggesting right atrial pressure of 8 mmHg. IAS/Shunts: No atrial level shunt detected by color flow Doppler.  LEFT VENTRICLE PLAX 2D LVIDd:         49.00 cm Diastology LVIDs:         3.80 cm  LV e' lateral:   14.30 cm/s LV PW:         0.80 cm  LV E/e' lateral: 7.7 LV IVS:        0.90 cm  LV e' medial:    9.36 cm/s LVOT diam:     2.10 cm  LV E/e' medial:  11.8 LV SV:         122 LV SV Index:   58 LVOT Area:     3.46 cm  RIGHT VENTRICLE RV S prime:     15.60 cm/s TAPSE (M-mode): 2.4 cm LEFT ATRIUM           Index       RIGHT ATRIUM           Index LA diam:      4.00 cm 1.90 cm/m  RA Area:     19.10 cm LA Vol (A2C): 63.8 ml 30.36 ml/m RA Volume:   54.10 ml  25.74 ml/m LA Vol (A4C): 62.5 ml 29.74 ml/m  AORTIC VALVE LVOT Vmax:   153.00 cm/s LVOT Vmean:  92.600 cm/s LVOT VTI:    0.351 m  AORTA Ao Root diam: 3.10 cm MITRAL VALVE MV Area (PHT): 3.24 cm     SHUNTS MV Decel Time: 234 msec     Systemic VTI:  0.35 m MR Peak grad: 98.8 mmHg     Systemic  Diam: 2.10 cm MR Mean grad: 77.0 mmHg MR Vmax:      497.00 cm/s MR Vmean:     423.0 cm/s MV E velocity: 110.50 cm/s MV A velocity: 48.00 cm/s MV E/A ratio:  2.30 Dorris Carnes MD Electronically signed by Dorris Carnes MD Signature Date/Time: 08/18/2019/4:46:51 PM    Final    Disposition   Patient is being discharged home today in good  condition.  Follow-up Plans & Appointments     Follow-up Information    [facility] Follow up.   Specialty: Cardiology Why: Follow-up as needed. Contact information: [address] [place] [phone]             Discharge Instructions    Call MD for:  severe uncontrolled pain   Complete by: As directed    Call MD for:  temperature >100.4   Complete by: As directed    Diet - low sodium heart healthy   Complete by: As directed    Increase activity slowly   Complete by: As directed    No wound care   Complete by: As directed    Follow any wound care instructions from OBGYN.      Discharge Medications   Allergies as of 08/18/2019      Reactions   Sulfa Antibiotics Hives      Medication List    TAKE these medications   docusate sodium 50 MG capsule Commonly known as: COLACE Take 1 capsule (50 mg total) by mouth 2 (two) times daily.   ferrous sulfate 325 (65 FE) MG tablet Commonly known as: FerrouSul Take 1 tablet (325 mg total) by mouth daily with breakfast.   furosemide 20 MG tablet Commonly known as: Lasix Take 1 tablet (20 mg total) by mouth daily as needed for fluid or edema (shortness of breath or edema).   ibuprofen 800 MG tablet Commonly known as: ADVIL Take 1 tablet (800 mg total) by mouth every 8 (eight) hours as needed. What changed: reasons to take this   oxyCODONE-acetaminophen 5-325 MG tablet Commonly known as: Percocet Take 1 tablet by mouth every 6 (six) hours as needed for severe pain.   PRENATAL VITAMIN PO Take 1 tablet by mouth daily.   simethicone 80 MG  chewable tablet Commonly known as: MYLICON Chew 1 tablet (80 mg total) by mouth as needed for flatulence.          Outstanding Labs/Studies   N/A  Duration of Discharge Encounter   Greater than 30 minutes including physician time.  Signed, Darreld Mclean, PA-C 08/18/2019, 6:54 PM   Thompson Grayer MD, [place] 08/18/2019 7:32 PM

## 2019-08-18 NOTE — ED Notes (Signed)
Returned from CT.

## 2019-08-18 NOTE — ED Notes (Signed)
[facility] here to transport pt to [facility] 

## 2019-08-18 NOTE — ED Notes (Signed)
Pt currently in CT.

## 2019-08-18 NOTE — ED Notes (Addendum)
Up to bathroom with standby assist. Tolerated well. Pt aware she needs to remain NPO until evaluated by cardiology.

## 2019-08-18 NOTE — H&P (Signed)
CARDIOLOGY ADMISSION NOTE  Patient ID: Carla Gonzalez MRN: [id number] DOB/AGE: 1985/09/08 34 y.o.  Admit date: 08/18/2019 Primary Physician   Dr. Harrell Gave, [facility] Primary Cardiologist   No primary care provider on file. Chief Complaint    bradycardia  HPI:  Carla Gonzalez is a 34 yo CF w/ without significant PMH. She has recently given birth via C-section (obtained epidural) to a baby girl on Monday and is supposed to be on maternity leave. 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 Carla Gonzalez has been experiencing episodes of chest tightness associated with bradycardia since 2 days ago. She noticed her symptoms 08/16/2019 night and her symptoms reoccured the night of 08/17/2019. She noticed chest tightness radiating to her jaw. She noticed HR dropping below 50bpm on apple watch and has presented to [facility] ED. Patient's HR has seen dropping to 30bpm with associated chest pressure and throat tightness but remains hemodynamically intact. CTA for PE was negative. Patient denies taking any beta blockers and herbal medications. She only takes prenatal vitamins. She denies any family hx of CAD, arrhythmias. She denies any thyroid abnormalities, neurological changes, fevers, chills, night sweats, urinary discomfort, purulence at C-section site, lightheadedness or syncope.  EKG:  Sinus bradycardia, sinus arrhythmia.  Past Medical History:  Diagnosis Date  . Medical history non-contributory     Past Surgical History:  Procedure Laterality Date  . APPENDECTOMY    . CESAREAN SECTION N/A 08/12/2019   Procedure: CESAREAN SECTION;  Surgeon: Jerelyn Charles, MD;  Location: [facility];  Service: Obstetrics;  Laterality: N/A;  . Grommet    . TONSILLECTOMY AND ADENOIDECTOMY  2009    Allergies  Allergen Reactions  . Sulfa Antibiotics Hives   No current facility-administered medications on file prior to encounter.   Current Outpatient Medications on File Prior to Encounter  Medication Sig  Dispense Refill  . docusate sodium (COLACE) 50 MG capsule Take 1 capsule (50 mg total) by mouth 2 (two) times daily. 30 capsule 0  . ferrous sulfate (FERROUSUL) 325 (65 FE) MG tablet Take 1 tablet (325 mg total) by mouth daily with breakfast. 30 tablet 11  . ibuprofen (ADVIL) 800 MG tablet Take 1 tablet (800 mg total) by mouth every 8 (eight) hours as needed. 30 tablet 0  . oxyCODONE-acetaminophen (PERCOCET) 5-325 MG tablet Take 1 tablet by mouth every 6 (six) hours as needed for severe pain. 10 tablet 0  . Prenatal Vit-Fe Fumarate-FA (PRENATAL VITAMIN PO) Take 1 tablet by mouth daily.     . simethicone (MYLICON) 80 MG chewable tablet Chew 1 tablet (80 mg total) by mouth as needed for flatulence. 30 tablet 0   Social History   Socioeconomic History  . Marital status: Married    Spouse name: Not on file  . Number of children: Not on file  . Years of education: Not on file  . Highest education level: Not on file  Occupational History  . Not on file  Tobacco Use  . Smoking status: Never Smoker  . Smokeless tobacco: Never Used  Vaping Use  . Vaping Use: Never used  Substance and Sexual Activity  . Alcohol use: Not Currently  . Drug use: Never  . Sexual activity: Yes    Birth control/protection: None  Other Topics Concern  . Not on file  Social History Narrative  . Not on file   Social Determinants of Health   Financial Resource Strain:   . Difficulty of Paying Living Expenses:   Food  Insecurity:   . Worried About Charity fundraiser in the Last Year:   . Arboriculturist in the Last Year:   Transportation Needs:   . Film/video editor (Medical):   Marland Kitchen Lack of Transportation (Non-Medical):   Physical Activity:   . Days of Exercise per Week:   . Minutes of Exercise per Session:   Stress:   . Feeling of Stress :   Social Connections:   . Frequency of Communication with Friends and Family:   . Frequency of Social Gatherings with Friends and Family:   . Attends Religious  Services:   . Active Member of Clubs or Organizations:   . Attends Archivist Meetings:   Marland Kitchen Marital Status:   Intimate Partner Violence:   . Fear of Current or Ex-Partner:   . Emotionally Abused:   Marland Kitchen Physically Abused:   . Sexually Abused:     Family History  Problem Relation Age of Onset  . Hypertension Father      Review of Systems: [y] = yes, [ ]  = no       General: Weight gain [] ; Weight loss [ ] ; Anorexia [ ] ; Fatigue [ ] ; Fever [ ] ; Chills [ ] ; Weakness [ ]     Cardiac: Chest pain/pressure Blue.Reese ]; Resting SOB [ ] ; Exertional SOB [ ] ; Orthopnea [ ] ; Pedal Edema [ ] ; Palpitations [ ] ; Syncope [ ] ; Presyncope [ ] ; Paroxysmal nocturnal dyspnea[ ]     Pulmonary: Cough [ ] ; Wheezing[ ] ; Hemoptysis[ ] ; Sputum [ ] ; Snoring [ ]     GI: Vomiting[ ] ; Dysphagia[ ] ; Melena[ ] ; Hematochezia [ ] ; Heartburn[ ] ; Abdominal pain [ ] ; Constipation [ ] ; Diarrhea [ ] ; BRBPR [ ]     GU: Hematuria[ ] ; Dysuria [ ] ; Nocturia[ ]   Vascular: Pain in legs with walking [ ] ; Pain in feet with lying flat [ ] ; Non-healing sores [ ] ; Stroke [ ] ; TIA [ ] ; Slurred speech [ ] ;    Neuro: Headaches[ ] ; Vertigo[ ] ; Seizures[ ] ; Paresthesias[ ] ;Blurred vision [ ] ; Diplopia [ ] ; Vision changes [ ]     Ortho/Skin: Arthritis [ ] ; Joint pain [ ] ; Muscle pain [ ] ; Joint swelling [ ] ; Back Pain [ ] ; Rash [ ]     Psych: Depression[ ] ; Anxiety[ ]     Heme: Bleeding problems [ ] ; Clotting disorders [ ] ; Anemia [ ]     Endocrine: Diabetes [ ] ; Thyroid dysfunction[ ]   Physical Exam: Blood pressure 128/69, pulse (!) 43, temperature 98.8 F (37.1 C), temperature source Oral, resp. rate 17, height 5\' 7"  (1.702 m), weight 99.3 kg, last menstrual period 11/12/2018, SpO2 97 %, unknown if currently breastfeeding.   GENERAL: Patient is afebrile, Vital signs reviewed, Well appearing, Patient appears comfortable, Alert and lucid. EYES: Normal inspection. HEENT:  normocephalic, atraumatic , normal ENT inspection. ORAL:   Moist NECK:  supple , normal inspection. CARD:  Bradycardia, heart sounds normal. RESP:  no respiratory distress, breath sounds normal. ABD: soft, tender to palpation , BS present, soft, no organomegaly or masses . BACK: non-tender. No CVA tenderness. MUSC:  normal ROM, non-tender , no pedal edema . SKIN: color normal, no rash, warm, dry . NEURO: awake & alert, lucid, no motor/sensory deficit. Gait stable. PSYCH: mood/affect normal.   Labs: Lab Results  Component Value Date   BUN 18 08/18/2019   Lab Results  Component Value Date   CREATININE 0.72 08/18/2019   Lab Results  Component Value Date   NA 141 08/18/2019  K 3.8 08/18/2019   CL 106 08/18/2019   CO2 22 08/18/2019   No results found for: TROPONINI Lab Results  Component Value Date   WBC 10.8 (H) 08/18/2019   HGB 11.8 (L) 08/18/2019   HCT 35.2 (L) 08/18/2019   MCV 89.3 08/18/2019   PLT 231 08/18/2019   Lab Results  Component Value Date   CHOL 138 12/28/2018   HDL 53 12/28/2018   LDLCALC 71 12/28/2018   TRIG 67 12/28/2018   CHOLHDL 2.6 12/28/2018   Lab Results  Component Value Date   ALT 20 08/18/2019   AST 23 08/18/2019   ALKPHOS 104 08/18/2019   BILITOT 0.8 08/18/2019      Radiology:  CXR:   Mild cardiomegaly. There is increased attenuation at the lung bases which may be secondary to breast tissue attenuation artifact or small bilateral pleural effusions. Bibasilar atelectasis is noted.  CTA: 08/18/2019 No evidence of pulmonary embolus.  No acute cardiopulmonary disease.  ASSESSMENT:  Carla Gonzalez is a 34 yo CF w/ without significant PMH. She is admitted for symptomatic bradycardia with HR dropping into 30s. Differentials include epidural related (neuroaxial anesthesia), metabolic vs carotid sinus dysfunction.   PLAN:   - continue tele monitoring - obtain Echo - obtain TSH - EPS consultation in AM - Avoid beta blockers  Signed: Jandy Brackens 08/18/2019, 5:48 AM

## 2019-08-18 NOTE — ED Notes (Signed)
Contacted Dr. Philis Pique [facility]) @ [phone] for consult

## 2019-08-18 NOTE — Progress Notes (Signed)
*  PRELIMINARY RESULTS* Echocardiogram 2D Echocardiogram has been performed.  Carla Gonzalez 08/18/2019, 4:05 PM

## 2019-08-18 NOTE — Discharge Instructions (Signed)
Take Lasix 20mg  daily as needed for shortness of breath or swelling.  Our office will call you to scheduled repeat Echo in 6-8 weeks.

## 2019-08-19 MED FILL — FUROSEMIDE 20 MG TABS: 20 | 30 days supply | Qty: 30 | Fill #0

## 2019-08-20 ENCOUNTER — Encounter: Payer: Self-pay | Admitting: *Deleted

## 2019-08-20 ENCOUNTER — Encounter: Payer: 59 | Admitting: Physical Therapy

## 2019-08-20 ENCOUNTER — Other Ambulatory Visit: Payer: Self-pay | Admitting: *Deleted

## 2019-08-20 NOTE — Patient Outreach (Signed)
[facility]) Care Management  08/20/2019  Carla Gonzalez 1985/04/28 [id number]   Transition of care call/case closure   Referral received:08/20/19 Initial outreach:08/20/19 Insurance: [place] [employer]    Subjective: Initial successful telephone call to patient's preferred number in order to complete transition of care assessment; 2 HIPAA identifiers verified. Explained purpose of call and completed transition of care assessment.  Carla Gonzalez says that she feeling okay . She denies shortness of fullness in throat . She reports using lasix prn for swelling or shortness of breath . She discussed recent C-section having baby girl prior to being readmitted. She reports progressing well from delivery. She is  tolerating diet, denies bowel or bladder problems.  Spouse and her parents  are assisting during her  recovery.  She  uses a Company secretary outpatient pharmacy at [facility] . Objective:  Carla Gonzalez  was hospitalized at [facility] from 6/13-6/13/21 for symptomatic bradycardia  Comorbidities include: Epidural C-Section on 08/12/19 and discharge home on 08/14/19  She was discharged to home on 6/13/21without the need for home health services or DME.   Assessment:  Patient voices good understanding of all discharge instructions.  See transition of care flowsheet for assessment details.   Plan:  Reviewed hospital discharge diagnosis of symptomatic bradycardia    and discharge treatment plan using hospital discharge instructions, assessing medication adherence, reviewing problems requiring provider notification, and discussing the importance of follow up with surgeon, primary care provider and/or specialists as directed.   No ongoing care management needs identified so will close case to [place] Management services and route successful outreach letter with [place] Management pamphlet and 24 Hour Nurse Line Magnet to [place] Management clinical pool to be mailed to patient's home address.   Carla Draft, RN, BSN  [place] Management Coordinator  [phone]- Mobile [phone]- Toll Free Main Office

## 2019-08-26 ENCOUNTER — Telehealth: Payer: Self-pay | Admitting: Internal Medicine

## 2019-08-26 NOTE — Telephone Encounter (Signed)
Received notification today from [facility] that Carla Gonzalez had been in hospital from 08/18/2019 to 08/20/2019. We need to do hospital follow up and medication reconciliation.

## 2019-08-26 NOTE — Telephone Encounter (Signed)
Transition Care Management Follow-up Telephone Call  Date of discharge and from where: [facility]  How have you been since you were released from the hospital? Better, has to repeat ECHO in August  Any questions or concerns? No   Items Reviewed:  Did the pt receive and understand the discharge instructions provided? Yes   Medications obtained and verified? Yes   Any new allergies since your discharge? Yes   Dietary orders reviewed? Yes  Do you have support at home? Yes   Functional Questionnaire: (I = Independent and D = Dependent) ADLs: I  Bathing/Dressing- I  Meal Prep- I  Eating- I  Maintaining continence- I  Transferring/Ambulation- I  Managing Meds- I  Follow up appointments reviewed:   PCP Hospital f/u appt confirmed? Yes  Scheduled to see 08/27/19 @ 12:00pm.  [facility] f/u appt confirmed? Yes  10/07/19 .  Are transportation arrangements needed? No   If their condition worsens, is the pt aware to call PCP or go to the Emergency Dept.? Yes  Was the patient provided with contact information for the PCP's office or ED? Yes  Was to pt encouraged to call back with questions or concerns? Yes

## 2019-08-27 ENCOUNTER — Encounter: Payer: Self-pay | Admitting: Internal Medicine

## 2019-08-27 ENCOUNTER — Other Ambulatory Visit: Payer: Self-pay

## 2019-08-27 ENCOUNTER — Ambulatory Visit: Payer: 59 | Admitting: Internal Medicine

## 2019-08-27 VITALS — BP 120/80 | HR 63 | Ht 67.0 in | Wt 210.0 lb

## 2019-08-27 DIAGNOSIS — O403XX0 Polyhydramnios, third trimester, not applicable or unspecified: Secondary | ICD-10-CM

## 2019-08-27 DIAGNOSIS — R079 Chest pain, unspecified: Secondary | ICD-10-CM

## 2019-08-27 DIAGNOSIS — R001 Bradycardia, unspecified: Secondary | ICD-10-CM | POA: Diagnosis not present

## 2019-08-27 DIAGNOSIS — Z98891 History of uterine scar from previous surgery: Secondary | ICD-10-CM | POA: Diagnosis not present

## 2019-08-27 DIAGNOSIS — Z09 Encounter for follow-up examination after completed treatment for conditions other than malignant neoplasm: Secondary | ICD-10-CM

## 2019-08-28 NOTE — Progress Notes (Signed)
   Subjective:    Patient ID: Carla Gonzalez, female    DOB: 06-Jun-1985, 34 y.o.   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  Patient had C-section on June 7 delivering her second child, a 9 pound baby girl.  Apparently had polyhydramnios.  There was concern for possible fetal ventriculomegaly but currently that is not suspected.  Subsequently while recovering at home she felt some chest discomfort that was vague without radiation to neck or down the arm.  Also noticed bradycardia.  Initially noticed symptoms on June 11.  Says that heart rate dropped below 50 bpm on her apple watch.  Apparently at one point heart rate was as low as 30 bpm.  CT angio was negative for PE.  Patient was not on beta-blocker medication or taking any herbal medications.  No family history of coronary artery disease or arrhythmias.  Patient has no history of thyroid abnormality.  Did not have syncope.  In the emergency department patient was seen to have sinus bradycardia and sinus arrhythmia.  She was referred to cardiology and admitted for observation.  She had 2D echocardiogram which was normal.  Pulse on admission was 43 with blood pressure 128/69.  Pulse oximetry was 97%.  TSH was normal.  MI was ruled out.  Echocardiogram showed normal ejection fraction and normal wall motion.  Mitral valve was normal as was the aortic valve.  There was no evidence of aortic valve regurgitation.  Patient had mild mitral valve regurgitation.  Estimated left ventricular ejection fraction 60 to 65%.  No wall motion abnormalities.  Left atrium and right atrium were of normal size.  No evidence of pericardial effusion.  Patient was felt to have dilated pulmonary artery (mild) at 34.5.  Inferior vena cava noted to be dilated.  Echocardiogram is to be repeated by cardiology in a few weeks.  Patient continues to rest at home and is on maternity  leave.  Will not be returning to work until September.  Has not noticed any further episodes of significant bradycardia.  She is sore from her C-section.    Review of Systems see above-no cough hemoptysis or chest pain at present time     Objective:   Physical Exam  Blood pressure 120/80, pulse 63, pulse oximetry 97% weight 210 pounds Skin warm and dry.  No adenopathy.  No thyromegaly.  No carotid bruits.  Chest clear.  Cardiac exam regular rate and rhythm normal S1 and S2 without murmurs or gallops.  No lower extremity pitting edema.  On June 13: Hemoglobin A1c 5.5% and TSH is normal at 2.788     Assessment & Plan:  Chest pain-resting-etiology unclear-PE ruled out  History of polyhydramnios  Status post C-section for polyhydramnios and suspected ventriculomegaly  Plan: Cardiology plans to repeat echo in a few weeks.  This seems reasonable to me.  She is to take it easy at home and notify cardiology if bradycardia or significant chest pain recurs.  Have not scheduled a return visit here.

## 2019-08-28 NOTE — Patient Instructions (Addendum)
It was a pleasure to see you today.  Take it easy around the house.  Follow-up with cardiology and repeat 2D echocardiogram.  Call cardiology if symptoms recur.

## 2019-09-30 ENCOUNTER — Ambulatory Visit: Payer: 59 | Attending: Obstetrics | Admitting: Physical Therapy

## 2019-09-30 ENCOUNTER — Other Ambulatory Visit: Payer: Self-pay

## 2019-09-30 DIAGNOSIS — M6281 Muscle weakness (generalized): Secondary | ICD-10-CM | POA: Diagnosis not present

## 2019-09-30 DIAGNOSIS — M545 Low back pain, unspecified: Secondary | ICD-10-CM

## 2019-09-30 DIAGNOSIS — M25651 Stiffness of right hip, not elsewhere classified: Secondary | ICD-10-CM | POA: Diagnosis not present

## 2019-09-30 DIAGNOSIS — M25652 Stiffness of left hip, not elsewhere classified: Secondary | ICD-10-CM | POA: Insufficient documentation

## 2019-09-30 NOTE — Patient Instructions (Signed)
Access Code: [email or web address] URL: [email or web address] Date: 09/30/2019 Prepared by: Estill Bamberg April Thurnell Garbe  Exercises Supine Hip Adduction Isometric with Diona Foley - 1 x daily - 7 x weekly - 3 sets - 10 reps Hooklying Clamshell with Resistance - 1 x daily - 7 x weekly - 3 sets - 10 reps Supine Bridge - 1 x daily - 7 x weekly - 3 sets - 10 reps 90/90 SI Joint Self-Correction with Dowel - 1 x daily - 7 x weekly - 3 sets Supine SI Joint Self-Correction - 1 x daily - 7 x weekly - 3 sets Seated Flexion Stretch with Swiss Ball - 1 x daily - 7 x weekly - 3 sets - 30 sec hold Seated Thoracic Flexion and Rotation with Swiss Ball - 1 x daily - 7 x weekly - 3 sets - 30 sec hold

## 2019-09-30 NOTE — Therapy (Signed)
[place], [place], [address] Phone: [phone]   Fax:  [phone]  Physical Therapy Evaluation  Patient Details  Name: Carla Gonzalez MRN: [id number] Date of Birth: April 24, 1985 Referring Provider (PT): Jerelyn Charles, MD   Encounter Date: 09/30/2019   PT End of Session - 09/30/19 1123    Visit Number 1    Number of Visits 12    Date for PT Re-Evaluation 10/14/19    PT Start Time 1130    PT Stop Time 1215    PT Time Calculation (min) 45 min    Activity Tolerance Patient tolerated treatment well    Behavior During Therapy [facility] for tasks assessed/performed           Past Medical History:  Diagnosis Date  . Medical history non-contributory     Past Surgical History:  Procedure Laterality Date  . APPENDECTOMY    . CESAREAN SECTION N/A 08/12/2019   Procedure: CESAREAN SECTION;  Surgeon: Jerelyn Charles, MD;  Location: [facility] LD [facility]S;  Service: Obstetrics;  Laterality: N/A;  . Grommet    . TONSILLECTOMY AND ADENOIDECTOMY  2009    There were no vitals filed for this visit.    Subjective Assessment - 09/30/19 1130    Subjective Pt reports both of her children were big. First child was delivered vaginally (1 1/2 y.o). 2nd child was delivered via c-section on June 7th. Pt reports she's been doing kegel. Reports L>R LBP. Pt states she tried PT in the last month in a half of her pregnancy.    How long can you stand comfortably? Discomfort ~30 min    How long can you walk comfortably? Discomfort ~30 min    Currently in Pain? Yes    Pain Location Abdomen    Pain Orientation Lower              [facility] PT Assessment - 09/30/19 0001      Assessment   Medical Diagnosis R10.2 (ICD-10-CM) - Pelvic and perineal pain    Referring Provider (PT) Jerelyn Charles, MD    Onset Date/Surgical Date 08/12/19    Prior Therapy March 2021 for pelvic pain      Precautions   Precautions None      Restrictions   Weight  Bearing Restrictions No      Balance Screen   Has the patient fallen in the past 6 months No      [place] residence    Living Arrangements Spouse/significant other;Children    Available Help at Discharge Family      Prior Function   Level of Independence Independent    Vocation Full time employment      Observation/Other Assessments   Observations L iliac crest higher than R in standing, lower PSIS in prone; increased R hip rotation with trunk flexion    Focus on Therapeutic Outcomes (FOTO)  44%      Squat   Comments decreased L pelvic rotation, straight lumbar      Single Leg Squat   Comments bilaterally poor stability with knee valgus      Single Leg Stance   Comments WFL      Strength   Right Hip Flexion 4-/5    Right Hip Extension 3+/5    Right Hip External Rotation  4-/5    Right Hip Internal Rotation 4/5    Right Hip ABduction 4-/5    Left Hip Flexion 4-/5  Left Hip Extension 3/5    Left Hip External Rotation 4-/5    Left Hip Internal Rotation 4/5    Left Hip ABduction 3+/5      Flexibility   Hamstrings 70 deg on R, restricted on L to 60 deg due to pelvic pain      Pelvic Dictraction   Findings Negative      Pelvic Compression   Findings Negative      Sacral thrust    Findings Positive    Side Left      Gaenslen's test   Findings Positive    Side  Left      Sacral Compression   Findings Positive    Side  Left                      Objective measurements completed on examination: See above findings.                 PT Short Term Goals - 09/30/19 1233      PT SHORT TERM GOAL #1   Title independent with initial HEP to correct pelvis and lumbar ROM    Time 3    Period Weeks    Status New    Target Date 10/21/19      PT SHORT TERM GOAL #2   Title improve bilat hip strength to at least 4/5    Time 3    Period Weeks    Status New    Target Date 10/21/19             PT  Long Term Goals - 09/30/19 1233      PT LONG TERM GOAL #1   Title Pt will have 5/5 hip strength to be able to care for her children    Time 6    Period Weeks    Status New    Target Date 11/11/19      PT LONG TERM GOAL #2   Title Pt will be able to squat and lift at least 25 lbs to safely carry/lift her babies and any equipment    Baseline Unable    Time 6    Period Weeks    Status New    Target Date 11/11/19      PT LONG TERM GOAL #3   Title Pt will be able to tolerate standing and walking for at least 1 hour    Baseline Only tolerates ~30 min    Time 6    Period Weeks    Status New    Target Date 11/11/19      PT LONG TERM GOAL #4   Title Pt will be able tolerate planking at least 30 sec to demonstrate functional core strength    Baseline unable    Time 6    Period Weeks    Status New    Target Date 11/11/19      PT LONG TERM GOAL #5   Title Pt will improve FOTO score to 26%    Baseline FOTO 44%    Time 6    Period Weeks    Status New    Target Date 11/11/19                  Plan - 09/30/19 1222    Clinical Impression Statement Pt is a 34 y/o F presenting to clinic s/p c-section birth of her 2nd child on June 7. Pt demonstrates low back/pelvic tightness (particularly L quadratus lumborum)  and general hip/core weakness. Pt with higher L iliac crest in standing and decreased rotation during trunk/hip flexion. PT addressed this with manual therapy and MET with more level pelvis after session. Pt has attempted PT in the last 1.5 months of her pregnancy [facility]; however, she had difficulty with coming to sessions due to her work schedule. Discussed with pt that in regards to pelvic floor strengthening, the women's health clinic may be better equipped; however, we would be happy to continue to treat her low back and pelvis/hip and then transition her care to them if there is a need. Pt is agreeable to this plan. Pt would benefit from therapy to  maximize her level of function post-partum.    Personal Factors and Comorbidities Age;Comorbidity 1;Time since onset of injury/illness/exacerbation;Past/Current Experience    Comorbidities vaginal birth of first child in 2020, c-section Aug 12, 2019    Examination-Activity Limitations Bend;Carry;Squat;Lift;Stand    Examination-Participation Restrictions Community Activity;Driving;Cleaning;Laundry;Yard Work    Stability/Clinical Decision Making Stable/Uncomplicated    Designer, jewellery Low    Rehab Potential Good    PT Frequency 2x / week    PT Duration 6 weeks    PT Treatment/Interventions ADLs/Self Care Home Management;Biofeedback;Cryotherapy;Electrical Stimulation;Iontophoresis 75m/ml Dexamethasone;Moist Heat;Traction;Ultrasound;Gait training;Stair training;Functional mobility training;Therapeutic activities;Therapeutic exercise;Balance training;Neuromuscular re-education;Patient/family education;Manual techniques;Passive range of motion;Dry needling;Taping;Spinal Manipulations;Joint Manipulations    PT Next Visit Plan Assess response to HEP. MET and manual therapy as indicated for any SI/pelvic malalignment, continue to strengthen core and hips.    PT Home Exercise Plan Access Code [email or web address]    Consulted and Agree with Plan of Care Patient           Patient will benefit from skilled therapeutic intervention in order to improve the following deficits and impairments:  Decreased range of motion, Difficulty walking, Increased fascial restricitons, Decreased activity tolerance, Hypermobility, Hypomobility, Impaired flexibility, Improper body mechanics, Decreased mobility, Decreased strength, Postural dysfunction  Visit Diagnosis: Obstetric damage to pelvic joints and ligaments  Muscle weakness (generalized)  Stiffness of left hip, not elsewhere classified  Stiffness of right hip, not elsewhere classified  Acute bilateral low back pain without sciatica     Problem List Patient  Active Problem List   Diagnosis Date Noted  . Symptomatic bradycardia 08/18/2019  . 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 Cesarean delivery due to previous difficult delivery, delivered, current hospitalization 08/12/2019  . Fetal malpresentation, delivered, current hospitalization 08/12/2019  . 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

## 2019-10-07 ENCOUNTER — Ambulatory Visit (HOSPITAL_COMMUNITY): Payer: 59 | Attending: Cardiology

## 2019-10-07 ENCOUNTER — Other Ambulatory Visit: Payer: Self-pay

## 2019-10-07 DIAGNOSIS — Q2579 Other congenital malformations of pulmonary artery: Secondary | ICD-10-CM | POA: Insufficient documentation

## 2019-10-07 DIAGNOSIS — R001 Bradycardia, unspecified: Secondary | ICD-10-CM | POA: Insufficient documentation

## 2019-10-07 LAB — ECHOCARDIOGRAM COMPLETE
Area-P 1/2: 3.12 cm2
S' Lateral: 3.6 cm

## 2019-10-08 ENCOUNTER — Ambulatory Visit: Payer: 59

## 2019-10-09 ENCOUNTER — Ambulatory Visit: Payer: 59 | Attending: Obstetrics

## 2019-10-09 ENCOUNTER — Other Ambulatory Visit: Payer: Self-pay

## 2019-10-09 DIAGNOSIS — M545 Low back pain, unspecified: Secondary | ICD-10-CM

## 2019-10-09 DIAGNOSIS — M25651 Stiffness of right hip, not elsewhere classified: Secondary | ICD-10-CM | POA: Insufficient documentation

## 2019-10-09 DIAGNOSIS — R102 Pelvic and perineal pain: Secondary | ICD-10-CM | POA: Insufficient documentation

## 2019-10-09 DIAGNOSIS — M25652 Stiffness of left hip, not elsewhere classified: Secondary | ICD-10-CM | POA: Insufficient documentation

## 2019-10-09 DIAGNOSIS — M6281 Muscle weakness (generalized): Secondary | ICD-10-CM | POA: Insufficient documentation

## 2019-10-09 DIAGNOSIS — R252 Cramp and spasm: Secondary | ICD-10-CM | POA: Insufficient documentation

## 2019-10-09 NOTE — Therapy (Signed)
[place], [place], [address] Phone: [phone]   Fax:  [phone]  Physical Therapy Treatment  Patient Details  Name: Carla Gonzalez MRN: [id number] Date of Birth: 10-05-1985 Referring Provider (PT): Jerelyn Charles, MD   Encounter Date: 10/09/2019   PT End of Session - 10/09/19 1151    Visit Number 2    Number of Visits 12    Date for PT Re-Evaluation 10/14/19    PT Start Time 1151    PT Stop Time 1238    PT Time Calculation (min) 47 min    Activity Tolerance Patient tolerated treatment well    Behavior During Therapy [facility] for tasks assessed/performed           Past Medical History:  Diagnosis Date  . Medical history non-contributory     Past Surgical History:  Procedure Laterality Date  . APPENDECTOMY    . CESAREAN SECTION N/A 08/12/2019   Procedure: CESAREAN SECTION;  Surgeon: Jerelyn Charles, MD;  Location: [facility] LD [facility];  Service: Obstetrics;  Laterality: N/A;  . Grommet    . TONSILLECTOMY AND ADENOIDECTOMY  2009    There were no vitals filed for this visit.   Subjective Assessment - 10/09/19 1153    Subjective She reports she is better and has more mobility in LT hip and walking better.    How long can you walk comfortably? 30 min    Currently in Pain? No/denies                             [facility] Adult PT Treatment/Exercise - 10/09/19 0001      Self-Care   Other Self-Care Comments  Application of belt between iliac crests and trochantor improved SLR on Lt and reported felt good with wlking so sugested using belt for walking and chores if these give her pain and see if she can tolerate incr distance or time      Exercises   Exercises Knee/Hip      Lumbar Exercises: Supine   Bridge Limitations x30    Other Supine Lumbar Exercises adductor squeeze x 30 with ball.  then clam x 30 with green band both with abdominal set.       Lumbar Exercises: Sidelying   Other  Sidelying Lumbar Exercises RT side lye with knees and hips 90 degrees Lt foot on bolster hiher than Lt knee towel between knees.  RT foot pressed inot Pt knee and inhale with retraction of Lt pelvis and exhale with LT knee squeeze hold x 5 sed 5 reps  keeping LT pelvis retracted.  Also demo how to do in sitting.         Knee/Hip Exercises: Standing   Other Standing Knee Exercises RT foot on step with LT pelvis elevated and Lt leg IR with pressure into step from foot x 10 sec Done x2 du to hip muscle soreness from other exerciss                  PT Education - 10/09/19 1301    Education Details HEP , possible pelvic asymetry, use of SI belt,  cued to stop any exercise that incr pain    Person(s) Educated Patient    Methods Explanation;Demonstration;Verbal cues;Handout;Tactile cues    Comprehension Returned demonstration;Verbalized understanding            PT Short Term Goals - 09/30/19 1233      PT  SHORT TERM GOAL #1   Title independent with initial HEP to correct pelvis and lumbar ROM    Time 3    Period Weeks    Status New    Target Date 10/21/19      PT SHORT TERM GOAL #2   Title improve bilat hip strength to at least 4/5    Time 3    Period Weeks    Status New    Target Date 10/21/19             PT Long Term Goals - 09/30/19 1233      PT LONG TERM GOAL #1   Title Pt will have 5/5 hip strength to be able to care for her children    Time 6    Period Weeks    Status New    Target Date 11/11/19      PT LONG TERM GOAL #2   Title Pt will be able to squat and lift at least 25 lbs to safely carry/lift her babies and any equipment    Baseline Unable    Time 6    Period Weeks    Status New    Target Date 11/11/19      PT LONG TERM GOAL #3   Title Pt will be able to tolerate standing and walking for at least 1 hour    Baseline Only tolerates ~30 min    Time 6    Period Weeks    Status New    Target Date 11/11/19      PT LONG TERM GOAL #4   Title Pt will  be able tolerate planking at least 30 sec to demonstrate functional core strength    Baseline unable    Time 6    Period Weeks    Status New    Target Date 11/11/19      PT LONG TERM GOAL #5   Title Pt will improve FOTO score to 26%    Baseline FOTO 44%    Time 6    Period Weeks    Status New    Target Date 11/11/19                 Plan - 10/09/19 1152    Clinical Impression Statement She is overall improved . SI belt seemd to help with strenght and mobility. Trial of LT SI exercise and LT leg raise and rotation for possible leg length difference.    PT Treatment/Interventions ADLs/Self Care Home Management;Biofeedback;Cryotherapy;Electrical Stimulation;Iontophoresis 36m/ml Dexamethasone;Moist Heat;Traction;Ultrasound;Gait training;Stair training;Functional mobility training;Therapeutic activities;Therapeutic exercise;Balance training;Neuromuscular re-education;Patient/family education;Manual techniques;Passive range of motion;Dry needling;Taping;Spinal Manipulations;Joint Manipulations    PT Next Visit Plan Assess response to HEP. MET and manual therapy as indicated for any SI/pelvic malalignment, continue to strengthen core and hips.    PT Home Exercise Plan Access Code [email or web address],     RT side SI exercise , stand LT leg elevation and IR  ,  SI belt trial    Consulted and Agree with Plan of Care Patient           Patient will benefit from skilled therapeutic intervention in order to improve the following deficits and impairments:  Decreased range of motion, Difficulty walking, Increased fascial restricitons, Decreased activity tolerance, Hypermobility, Hypomobility, Impaired flexibility, Improper body mechanics, Decreased mobility, Decreased strength, Postural dysfunction  Visit Diagnosis: Muscle weakness (generalized)  Stiffness of left hip, not elsewhere classified  Stiffness of right hip, not elsewhere classified  Acute bilateral low back pain without  sciatica     Problem List Patient Active Problem List   Diagnosis Date Noted  . Symptomatic bradycardia 08/18/2019  . 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 Cesarean delivery due to previous difficult delivery, delivered, current hospitalization 08/12/2019  . Fetal malpresentation, delivered, current hospitalization 08/12/2019  . 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

## 2019-10-09 NOTE — Patient Instructions (Signed)
LT SI exercise in RT side lye with Lt hip ER and hip and knees a t 90 degrees  Lt pelvis retracted and Lt knee adducted with RT foot press into fwall. X 5 sec x 5 reps  Stand Lt pelvic raise and leg IR x 10 sec 2-5 reps  1-2 x daily

## 2019-10-11 ENCOUNTER — Other Ambulatory Visit: Payer: Self-pay

## 2019-10-11 ENCOUNTER — Ambulatory Visit: Payer: 59 | Admitting: Physical Therapy

## 2019-10-11 DIAGNOSIS — M6281 Muscle weakness (generalized): Secondary | ICD-10-CM | POA: Diagnosis not present

## 2019-10-11 DIAGNOSIS — R252 Cramp and spasm: Secondary | ICD-10-CM | POA: Diagnosis not present

## 2019-10-11 DIAGNOSIS — M25652 Stiffness of left hip, not elsewhere classified: Secondary | ICD-10-CM | POA: Diagnosis not present

## 2019-10-11 DIAGNOSIS — M25651 Stiffness of right hip, not elsewhere classified: Secondary | ICD-10-CM | POA: Diagnosis not present

## 2019-10-11 DIAGNOSIS — R102 Pelvic and perineal pain: Secondary | ICD-10-CM | POA: Diagnosis not present

## 2019-10-11 DIAGNOSIS — M545 Low back pain, unspecified: Secondary | ICD-10-CM

## 2019-10-11 NOTE — Therapy (Signed)
[place], [place], [address] Phone: [phone]   Fax:  [phone]  Physical Therapy Treatment  Patient Details  Name: Carla Gonzalez MRN: [id number] Date of Birth: [birth year] Referring Provider (PT): Jerelyn Charles, MD   Encounter Date: 10/11/2019   PT End of Session - 10/11/19 1126    Visit Number 3    Number of Visits 12    Date for PT Re-Evaluation 10/14/19    PT Start Time 1050    PT Stop Time 1130    PT Time Calculation (min) 40 min    Activity Tolerance Patient tolerated treatment well    Behavior During Therapy [facility] for tasks assessed/performed           Past Medical History:  Diagnosis Date  . Medical history non-contributory     Past Surgical History:  Procedure Laterality Date  . APPENDECTOMY    . CESAREAN SECTION N/A 08/12/2019   Procedure: CESAREAN SECTION;  Surgeon: Jerelyn Charles, MD;  Location: [facility];  Service: Obstetrics;  Laterality: N/A;  . Grommet    . TONSILLECTOMY AND ADENOIDECTOMY  2009    There were no vitals filed for this visit.   Subjective Assessment - 10/11/19 1052    Subjective Pt states she definitely already feels better and walking better. Pt states she no longer has that left sided back pain.    How long can you walk comfortably? 30 min    Currently in Pain? No/denies                             [facility] Adult PT Treatment/Exercise - 10/11/19 0001      Lumbar Exercises: Supine   Pelvic Tilt 20 reps   with marching   Dead Bug 20 reps    Bridge 10 reps    Other Supine Lumbar Exercises double bent leg + leg straightening 2x10      Lumbar Exercises: Sidelying   Hip Abduction 20 reps;Both    Other Sidelying Lumbar Exercises RT side lye with knees and hips 90 degrees Lt foot on bolster hiher than Lt knee towel between knees.  RT foot pressed inot Pt knee and inhale with retraction of Lt pelvis and exhale with LT knee squeeze hold x 5  sed 5 reps  keeping LT pelvis retracted.  Also demo how to do in sitting.         Knee/Hip Exercises: Aerobic   Recumbent Bike L4 x 6 min      Knee/Hip Exercises: Standing   Hip Extension Stengthening;Both;2 sets;10 reps    Extension Limitations blue tband    Wall Squat 2 sets;10 reps   Posterior pelvic tilt   Other Standing Knee Exercises RT foot on step with LT pelvis elevated and Lt leg IR with pressure into step from foot x 5 sec Done x10                     PT Short Term Goals - 09/30/19 1233      PT SHORT TERM GOAL #1   Title independent with initial HEP to correct pelvis and lumbar ROM    Time 3    Period Weeks    Status New    Target Date 10/21/19      PT SHORT TERM GOAL #2   Title improve bilat hip strength to at least 4/5    Time 3  Period Weeks    Status New    Target Date 10/21/19             PT Long Term Goals - 09/30/19 1233      PT LONG TERM GOAL #1   Title Pt will have 5/5 hip strength to be able to care for her children    Time 6    Period Weeks    Status New    Target Date 11/11/19      PT LONG TERM GOAL #2   Title Pt will be able to squat and lift at least 25 lbs to safely carry/lift her babies and any equipment    Baseline Unable    Time 6    Period Weeks    Status New    Target Date 11/11/19      PT LONG TERM GOAL #3   Title Pt will be able to tolerate standing and walking for at least 1 hour    Baseline Only tolerates ~30 min    Time 6    Period Weeks    Status New    Target Date 11/11/19      PT LONG TERM GOAL #4   Title Pt will be able tolerate planking at least 30 sec to demonstrate functional core strength    Baseline unable    Time 6    Period Weeks    Status New    Target Date 11/11/19      PT LONG TERM GOAL #5   Title Pt will improve FOTO score to 26%    Baseline FOTO 44%    Time 6    Period Weeks    Status New    Target Date 11/11/19                 Plan - 10/11/19 1206    Clinical  Impression Statement Pt continues to improve. SI belt helps but feels too tight. Good response to L SI exercise & L leg raise/rotation. Treatment focused on improving core and hip strength. Pt with very weak core.    Personal Factors and Comorbidities Age;Comorbidity 1;Time since onset of injury/illness/exacerbation;Past/Current Experience    Comorbidities vaginal birth of first child in 2020, c-section Aug 12, 2019    Examination-Activity Limitations Bend;Carry;Squat;Lift;Stand    Examination-Participation Restrictions Community Activity;Driving;Cleaning;Laundry;Yard Work    Stability/Clinical Decision Making Stable/Uncomplicated    Designer, jewellery Low    Rehab Potential Good    PT Frequency 2x / week    PT Duration 6 weeks    PT Treatment/Interventions ADLs/Self Care Home Management;Biofeedback;Cryotherapy;Electrical Stimulation;Iontophoresis 42m/ml Dexamethasone;Moist Heat;Traction;Ultrasound;Gait training;Stair training;Functional mobility training;Therapeutic activities;Therapeutic exercise;Balance training;Neuromuscular re-education;Patient/family education;Manual techniques;Passive range of motion;Dry needling;Taping;Spinal Manipulations;Joint Manipulations    PT Next Visit Plan Assess response to HEP. MET and manual therapy as indicated for any SI/pelvic malalignment, continue to strengthen core and hips.    PT Home Exercise Plan Access Code [email or web address],     RT side SI exercise , stand LT leg elevation and IR. Included more core strengthening.    Consulted and Agree with Plan of Care Patient           Patient will benefit from skilled therapeutic intervention in order to improve the following deficits and impairments:  Decreased range of motion, Difficulty walking, Increased fascial restricitons, Decreased activity tolerance, Hypermobility, Hypomobility, Impaired flexibility, Improper body mechanics, Decreased mobility, Decreased strength, Postural dysfunction  Visit  Diagnosis: Muscle weakness (generalized)  Stiffness of left hip, not elsewhere classified  Stiffness  of right hip, not elsewhere classified  Acute bilateral low back pain without sciatica  Obstetric damage to pelvic joints and ligaments  Cramp and spasm  Pelvic pain     Problem List Patient Active Problem List   Diagnosis Date Noted  . Symptomatic bradycardia 08/18/2019  . 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 Cesarean delivery due to previous difficult delivery, delivered, current hospitalization 08/12/2019  . Fetal malpresentation, delivered, current hospitalization 08/12/2019  . 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

## 2019-10-14 ENCOUNTER — Ambulatory Visit: Payer: 59 | Admitting: Physical Therapy

## 2019-10-14 ENCOUNTER — Other Ambulatory Visit: Payer: Self-pay

## 2019-10-14 DIAGNOSIS — R102 Pelvic and perineal pain unspecified side: Secondary | ICD-10-CM

## 2019-10-14 DIAGNOSIS — M25652 Stiffness of left hip, not elsewhere classified: Secondary | ICD-10-CM

## 2019-10-14 DIAGNOSIS — M25651 Stiffness of right hip, not elsewhere classified: Secondary | ICD-10-CM

## 2019-10-14 DIAGNOSIS — R252 Cramp and spasm: Secondary | ICD-10-CM | POA: Diagnosis not present

## 2019-10-14 DIAGNOSIS — M545 Low back pain, unspecified: Secondary | ICD-10-CM

## 2019-10-14 DIAGNOSIS — M6281 Muscle weakness (generalized): Secondary | ICD-10-CM | POA: Diagnosis not present

## 2019-10-14 NOTE — Therapy (Signed)
[place], [place], [address] Phone: [phone]   Fax:  [phone]  Physical Therapy Treatment  Patient Details  Name: Carla Gonzalez MRN: [id number] Date of Birth: 12/02/1985 Referring Provider (PT): Jerelyn Charles, MD   Encounter Date: 10/14/2019   PT End of Session - 10/14/19 1128    Visit Number 4    Number of Visits 12    Date for PT Re-Evaluation 10/14/19    PT Start Time 1130    PT Stop Time 1213    PT Time Calculation (min) 43 min    Activity Tolerance Patient tolerated treatment well    Behavior During Therapy [facility] for tasks assessed/performed           Past Medical History:  Diagnosis Date  . Medical history non-contributory     Past Surgical History:  Procedure Laterality Date  . APPENDECTOMY    . CESAREAN SECTION N/A 08/12/2019   Procedure: CESAREAN SECTION;  Surgeon: Jerelyn Charles, MD;  Location: [facility] LD [facility]S;  Service: Obstetrics;  Laterality: N/A;  . Grommet    . TONSILLECTOMY AND ADENOIDECTOMY  2009    There were no vitals filed for this visit.   Subjective Assessment - 10/14/19 1131    Subjective Pt reports L side continues to do well. She reports some increased soreness with her core exercises. Pt reports feeling a pull on her R hip so she has not tried to push it too much.    How long can you stand comfortably? Discomfort ~30 min    How long can you walk comfortably? 30 min    Currently in Pain? No/denies                             [facility] Adult PT Treatment/Exercise - 10/14/19 0001      Lumbar Exercises: Supine   Bridge Limitations x30    Other Supine Lumbar Exercises marching 3 x 10    Other Supine Lumbar Exercises double bent leg flexion/extension with legs on black pball 2x10      Lumbar Exercises: Sidelying   Hip Abduction Limitations 3x10 bilat      Knee/Hip Exercises: Aerobic   Recumbent Bike L5 x 6 min      Knee/Hip Exercises: Standing    Functional Squat 1 set;10 reps    Wall Squat 3 sets;10 reps    Other Standing Knee Exercises palloff press 3 x 10 bilat      Knee/Hip Exercises: Seated   Marching Strengthening;Both;3 sets;10 reps    Marching Limitations On green pball      Knee/Hip Exercises: Prone   Hip Extension Strengthening;Both    Hip Extension Limitations 3x10                  PT Education - 10/14/19 1220    Education Details Educated pt that if core becomes too fatigued she may start compensating with her hip flexors -- she would benefit from a few rest days to allow muscle to build up.    Person(s) Educated Patient    Methods Explanation;Demonstration;Verbal cues;Tactile cues    Comprehension Verbalized understanding;Returned demonstration            PT Short Term Goals - 09/30/19 1233      PT SHORT TERM GOAL #1   Title independent with initial HEP to correct pelvis and lumbar ROM    Time 3    Period Weeks  Status New    Target Date 10/21/19      PT SHORT TERM GOAL #2   Title improve bilat hip strength to at least 4/5    Time 3    Period Weeks    Status New    Target Date 10/21/19             PT Long Term Goals - 09/30/19 1233      PT LONG TERM GOAL #1   Title Pt will have 5/5 hip strength to be able to care for her children    Time 6    Period Weeks    Status New    Target Date 11/11/19      PT LONG TERM GOAL #2   Title Pt will be able to squat and lift at least 25 lbs to safely carry/lift her babies and any equipment    Baseline Unable    Time 6    Period Weeks    Status New    Target Date 11/11/19      PT LONG TERM GOAL #3   Title Pt will be able to tolerate standing and walking for at least 1 hour    Baseline Only tolerates ~30 min    Time 6    Period Weeks    Status New    Target Date 11/11/19      PT LONG TERM GOAL #4   Title Pt will be able tolerate planking at least 30 sec to demonstrate functional core strength    Baseline unable    Time 6     Period Weeks    Status New    Target Date 11/11/19      PT LONG TERM GOAL #5   Title Pt will improve FOTO score to 26%    Baseline FOTO 44%    Time 6    Period Weeks    Status New    Target Date 11/11/19                 Plan - 10/14/19 1219    Clinical Impression Statement Treatment focused on continued core and bilat LE strengthening. Modified core exercises so pt with decreased pull in hip flexors. Trialed squats to elevated surface this session -- pt with difficulty performing. Continued to progress pt's wall squats.    Personal Factors and Comorbidities Age;Comorbidity 1;Time since onset of injury/illness/exacerbation;Past/Current Experience    Comorbidities vaginal birth of first child in 2020, c-section Aug 12, 2019    Examination-Activity Limitations Bend;Carry;Squat;Lift;Stand    Examination-Participation Restrictions Community Activity;Driving;Cleaning;Laundry;Yard Work    Stability/Clinical Decision Making Stable/Uncomplicated    Rehab Potential Good    PT Frequency 2x / week    PT Duration 6 weeks    PT Treatment/Interventions ADLs/Self Care Home Management;Biofeedback;Cryotherapy;Electrical Stimulation;Iontophoresis 58m/ml Dexamethasone;Moist Heat;Traction;Ultrasound;Gait training;Stair training;Functional mobility training;Therapeutic activities;Therapeutic exercise;Balance training;Neuromuscular re-education;Patient/family education;Manual techniques;Passive range of motion;Dry needling;Taping;Spinal Manipulations;Joint Manipulations    PT Next Visit Plan Assess response to HEP. MET and manual therapy as indicated for any SI/pelvic malalignment, continue to strengthen core and hips.    PT Home Exercise Plan Access Code [email or web address],     RT side SI exercise , stand LT leg elevation and IR. Included more core strengthening.    Consulted and Agree with Plan of Care Patient           Patient will benefit from skilled therapeutic intervention in order to improve the  following deficits and impairments:  Decreased range of motion, Difficulty walking, Increased  fascial restricitons, Decreased activity tolerance, Hypermobility, Hypomobility, Impaired flexibility, Improper body mechanics, Decreased mobility, Decreased strength, Postural dysfunction  Visit Diagnosis: Muscle weakness (generalized)  Stiffness of left hip, not elsewhere classified  Stiffness of right hip, not elsewhere classified  Acute bilateral low back pain without sciatica  Obstetric damage to pelvic joints and ligaments  Pelvic pain     Problem List Patient Active Problem List   Diagnosis Date Noted  . Symptomatic bradycardia 08/18/2019  . 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 Cesarean delivery due to previous difficult delivery, delivered, current hospitalization 08/12/2019  . Fetal malpresentation, delivered, current hospitalization 08/12/2019  . 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 [place], [place], [address] Phone: [phone]   Fax:  [phone]  Name: Carla Gonzalez MRN: [id number] Date of Birth: [birth year]

## 2019-10-17 ENCOUNTER — Other Ambulatory Visit: Payer: Self-pay

## 2019-10-17 ENCOUNTER — Ambulatory Visit: Payer: 59 | Admitting: Physical Therapy

## 2019-10-17 DIAGNOSIS — R102 Pelvic and perineal pain: Secondary | ICD-10-CM | POA: Diagnosis not present

## 2019-10-17 DIAGNOSIS — M25651 Stiffness of right hip, not elsewhere classified: Secondary | ICD-10-CM

## 2019-10-17 DIAGNOSIS — M6281 Muscle weakness (generalized): Secondary | ICD-10-CM

## 2019-10-17 DIAGNOSIS — M545 Low back pain, unspecified: Secondary | ICD-10-CM

## 2019-10-17 DIAGNOSIS — M25652 Stiffness of left hip, not elsewhere classified: Secondary | ICD-10-CM | POA: Diagnosis not present

## 2019-10-17 DIAGNOSIS — R252 Cramp and spasm: Secondary | ICD-10-CM | POA: Diagnosis not present

## 2019-10-17 NOTE — Therapy (Addendum)
[place], [place], [address] Phone: [phone]   Fax:  [phone]  Physical Therapy Treatment and Discharge  Patient Details  Name: Carla Gonzalez MRN: [id number] Date of Birth: August 10, 1985 Referring Provider (PT): Jerelyn Charles, MD    Encounter Date: 10/17/2019   PT End of Session - 10/17/19 1052    Visit Number 5    Number of Visits 12    Date for PT Re-Evaluation 10/14/19    PT Start Time 1050    PT Stop Time 1130    PT Time Calculation (min) 40 min    Activity Tolerance Patient tolerated treatment well    Behavior During Therapy [facility] for tasks assessed/performed           PHYSICAL THERAPY DISCHARGE SUMMARY  Visits from Start of Care: 5  Current functional level related to goals / functional outcomes: Pt has met all STG. Did not return to retest LTGs; however, pt was demonstrating good progress and increased strength on last visit.    Remaining deficits: Lifting/carrying 25#s. See left over LTGs.   Education / Equipment: HEP, strengthening, and posture  Plan: Patient agrees to discharge.  Patient goals were partially met. Patient is being discharged due to not returning since the last visit.  ?????          Past Medical History:  Diagnosis Date  . Medical history non-contributory     Past Surgical History:  Procedure Laterality Date  . APPENDECTOMY    . CESAREAN SECTION N/A 08/12/2019   Procedure: CESAREAN SECTION;  Surgeon: Jerelyn Charles, MD;  Location: [facility] LD ORS;  Service: Obstetrics;  Laterality: N/A;  . Grommet    . TONSILLECTOMY AND ADENOIDECTOMY  2009    There were no vitals filed for this visit.   Subjective Assessment - 10/17/19 1051    Subjective Pt reports she's been getting the exercises in at night. Pt reports that marching has gotten easier and no longer feels pull on the hip. Pt notes that performing chores around the house are getting easier.    How long  can you stand comfortably? Discomfort ~30 min    How long can you walk comfortably? 30 min    Currently in Pain? No/denies                             [facility] Adult PT Treatment/Exercise - 10/17/19 0001      Lumbar Exercises: Supine   Bridge 20 reps    Bridge Limitations legs on pball    Other Supine Lumbar Exercises lumbar rotation with pball 3 x 10    Other Supine Lumbar Exercises double bent leg flexion/extension with legs on black pball 3x10      Lumbar Exercises: Sidelying   Hip Abduction Limitations 3x10 bilat      Knee/Hip Exercises: Aerobic   Recumbent Bike L5 x 8 min      Knee/Hip Exercises: Machines for Strengthening   Total Gym Leg Press 65# DL 3x10      Knee/Hip Exercises: Supine   Straight Leg Raises Strengthening;Both;2 sets;10 reps    Other Supine Knee/Hip Exercises Marching x 10      Knee/Hip Exercises: Prone   Hip Extension Strengthening;Both    Hip Extension Limitations 3x10                    PT Short Term Goals - 10/17/19 1127  PT SHORT TERM GOAL #1   Title independent with initial HEP to correct pelvis and lumbar ROM    Time 3    Period Weeks    Status Achieved    Target Date 10/21/19      PT SHORT TERM GOAL #2   Title improve bilat hip strength to at least 4/5    Time 3    Period Weeks    Status Achieved    Target Date 10/21/19             PT Long Term Goals - 09/30/19 1233      PT LONG TERM GOAL #1   Title Pt will have 5/5 hip strength to be able to care for her children    Time 6    Period Weeks    Status New    Target Date 11/11/19      PT LONG TERM GOAL #2   Title Pt will be able to squat and lift at least 25 lbs to safely carry/lift her babies and any equipment    Baseline Unable    Time 6    Period Weeks    Status New    Target Date 11/11/19      PT LONG TERM GOAL #3   Title Pt will be able to tolerate standing and walking for at least 1 hour    Baseline Only tolerates ~30 min    Time 6     Period Weeks    Status New    Target Date 11/11/19      PT LONG TERM GOAL #4   Title Pt will be able tolerate planking at least 30 sec to demonstrate functional core strength    Baseline unable    Time 6    Period Weeks    Status New    Target Date 11/11/19      PT LONG TERM GOAL #5   Title Pt will improve FOTO score to 26%    Baseline FOTO 44%    Time 6    Period Weeks    Status New    Target Date 11/11/19                 Plan - 10/17/19 1112    Clinical Impression Statement Pt's core strength continues to improve. Treatment focused on progressing her core strengthening and bilat LE strengthening exercises. Pt with good tolerance to exercise.    Personal Factors and Comorbidities Age;Comorbidity 1;Time since onset of injury/illness/exacerbation;Past/Current Experience    Comorbidities vaginal birth of first child in 2020, c-section Aug 12, 2019    Examination-Activity Limitations Bend;Carry;Squat;Lift;Stand    Examination-Participation Restrictions Community Activity;Driving;Cleaning;Laundry;Yard Work    Stability/Clinical Decision Making Stable/Uncomplicated    Rehab Potential Good    PT Frequency 2x / week    PT Duration 6 weeks    PT Treatment/Interventions ADLs/Self Care Home Management;Biofeedback;Cryotherapy;Electrical Stimulation;Iontophoresis 31m/ml Dexamethasone;Moist Heat;Traction;Ultrasound;Gait training;Stair training;Functional mobility training;Therapeutic activities;Therapeutic exercise;Balance training;Neuromuscular re-education;Patient/family education;Manual techniques;Passive range of motion;Dry needling;Taping;Spinal Manipulations;Joint Manipulations    PT Next Visit Plan 6th visit FOTO. Assess response to HEP. MET and manual therapy as indicated for any SI/pelvic malalignment, continue to strengthen core and hips. Progress squats. Consider step training and addition of weight lifting (dead lift?).    PT Home Exercise Plan Access Code [email or web address]     Consulted and Agree with Plan of Care Patient           Patient will benefit from skilled therapeutic intervention in order to improve  the following deficits and impairments:  Decreased range of motion, Difficulty walking, Increased fascial restricitons, Decreased activity tolerance, Hypermobility, Hypomobility, Impaired flexibility, Improper body mechanics, Decreased mobility, Decreased strength, Postural dysfunction  Visit Diagnosis: Muscle weakness (generalized)  Stiffness of left hip, not elsewhere classified  Stiffness of right hip, not elsewhere classified  Acute bilateral low back pain without sciatica     Problem List Patient Active Problem List   Diagnosis Date Noted  . Symptomatic bradycardia 08/18/2019  . 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 Cesarean delivery due to previous difficult delivery, delivered, current hospitalization 08/12/2019  . Fetal malpresentation, delivered, current hospitalization 08/12/2019  . 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 [place], [place], [address] Phone: [phone]   Fax:  [phone]  Name: Cimone Fahey MRN: [id number] Date of Birth: [birth year]

## 2019-10-22 ENCOUNTER — Ambulatory Visit: Payer: 59 | Admitting: Physical Therapy

## 2019-10-25 ENCOUNTER — Ambulatory Visit: Payer: 59 | Admitting: Physical Therapy

## 2019-11-05 ENCOUNTER — Other Ambulatory Visit: Payer: Self-pay

## 2019-11-05 ENCOUNTER — Telehealth: Payer: Self-pay | Admitting: Internal Medicine

## 2019-11-05 ENCOUNTER — Telehealth (INDEPENDENT_AMBULATORY_CARE_PROVIDER_SITE_OTHER): Payer: 59 | Admitting: Internal Medicine

## 2019-11-05 ENCOUNTER — Encounter: Payer: Self-pay | Admitting: Internal Medicine

## 2019-11-05 VITALS — Temp 99.1°F

## 2019-11-05 DIAGNOSIS — Z20828 Contact with and (suspected) exposure to other viral communicable diseases: Secondary | ICD-10-CM

## 2019-11-05 DIAGNOSIS — J069 Acute upper respiratory infection, unspecified: Secondary | ICD-10-CM | POA: Diagnosis not present

## 2019-11-05 MED ORDER — AMOXICILLIN 500 MG PO CAPS: 500.0000 mg | ORAL_CAPSULE | Freq: Three times a day (TID) | ORAL | 0 refills | Status: DC

## 2019-11-05 MED FILL — AMOXICILLIN 500 MG CAPSULE: 500 | 10 days supply | Qty: 30 | Fill #0

## 2019-11-05 NOTE — Telephone Encounter (Signed)
scheduled

## 2019-11-05 NOTE — Addendum Note (Signed)
Addended by: Mady Haagensen on: 11/05/2019 04:35 PM   Modules accepted: Orders

## 2019-11-05 NOTE — Telephone Encounter (Signed)
Carla Gonzalez [phone]  [name] Comment called to say she has stuffy head, congestion, scratchy throat started on Sunday, no fever. Baby has RSV. And double ear infection, was tested for COVID last Wednesday after getting sick on Tuesday it was negative. She would like virtual visit. She had first COVID shot on July 28, is due for second one now.

## 2019-11-05 NOTE — Progress Notes (Signed)
   Subjective:    Patient ID: Carla Gonzalez, female    DOB: 01-19-1986, 34 y.o.   MRN: [id number]  HPI 34 year old Female seen via audio and video telecommunications due to the coronavirus pandemic.  She is identified using 2 identifiers as Carla Gonzalez. Carla Gonzalez, a patient in this practice.  Patient has been on maternity leave for several weeks.  Her young daughter has been diagnosed with Respiratory Syncytial Virus.    Patient has developed nasal congestion and sore throat.  Symptoms developed on Sunday, August 29.  She has a dry cough.  No fever.  Ears feel full.  She and her family were recently at the beach in [place].  During this appointment, patient is at her home and I am at my office.  She is agreeable to visit in this format today.  She is to return to work next week as she has been on maternity leave.  She has  had one COVID-19 vaccine and is due to get the other one soon. She deferred getting this sooner due to pregnancy.  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  General health is excellent.    Review of Systems no nausea vomiting, myalgias,dysguesia     Objective:   Physical Exam   Seen virtually and sounds nasally congested. Some cough. A bit fatigued.      Assessment & Plan:  Acute upper respiratory infection  Exposure to Respiratory Syncytial Virus   Plan: I have asked her to come by the office for an outside car visit for COVID-19 and Respiratory virus panel testing. She did this at 4 pm today.  I have placed her on Amoxicillin 500 mg 3 times a day for 10 days.Rest and drink fluids.

## 2019-11-05 NOTE — Telephone Encounter (Signed)
OK 

## 2019-11-05 NOTE — Patient Instructions (Addendum)
Covid 19 PCR test and Respiratory virus panel obtained at 4 pm after virtual visit earlier in the day from her car outside the office by staff.  Amoxicillin 500 mg 3 times a day for 10 days. Rest and drink fluids.

## 2019-11-06 LAB — RESPIRATORY VIRUS PANEL
Adenovirus B: NOT DETECTED
HUMAN PARAINFLU VIRUS 1: NOT DETECTED
HUMAN PARAINFLU VIRUS 2: NOT DETECTED
HUMAN PARAINFLU VIRUS 3: NOT DETECTED
INFLUENZA A SUBTYPE H1: NOT DETECTED
INFLUENZA A SUBTYPE H3: NOT DETECTED
Influenza A: NOT DETECTED
Influenza B: NOT DETECTED
Metapneumovirus: NOT DETECTED
Respiratory Syncytial Virus A: DETECTED — AB
Respiratory Syncytial Virus B: NOT DETECTED
Rhinovirus: NOT DETECTED

## 2019-11-06 LAB — SARS-COV-2 RNA,(COVID-19) QUALITATIVE NAAT: SARS CoV2 RNA: NOT DETECTED

## 2019-11-12 ENCOUNTER — Other Ambulatory Visit: Payer: Self-pay

## 2019-11-12 ENCOUNTER — Telehealth (INDEPENDENT_AMBULATORY_CARE_PROVIDER_SITE_OTHER): Payer: 59 | Admitting: Internal Medicine

## 2019-11-12 ENCOUNTER — Encounter: Payer: Self-pay | Admitting: Internal Medicine

## 2019-11-12 DIAGNOSIS — J0100 Acute maxillary sinusitis, unspecified: Secondary | ICD-10-CM | POA: Diagnosis not present

## 2019-11-12 DIAGNOSIS — J210 Acute bronchiolitis due to respiratory syncytial virus: Secondary | ICD-10-CM | POA: Diagnosis not present

## 2019-11-12 MED ORDER — PREDNISONE 10 MG PO TABS
ORAL_TABLET | ORAL | 0 refills | Status: DC
Start: 2019-11-12 — End: 2020-01-23

## 2019-11-12 MED FILL — predniSONE 10 MG TABS: 10 | 6 days supply | Qty: 21 | Fill #0

## 2019-11-12 NOTE — Telephone Encounter (Signed)
Set up virtual visit 

## 2019-11-12 NOTE — Telephone Encounter (Signed)
Pt calling back to give update. She said she is still having a lot of head pressure which is giving her a headache. She said the other symptoms are getting better just not that. She wanted to know what she should do.

## 2019-11-20 NOTE — Patient Instructions (Signed)
A letter has been prepared delaying your return to work due to persistent symptoms from RSV.  Finish course of amoxicillin and take tapering course of prednisone.  Rest and drink plenty of fluids.

## 2019-11-20 NOTE — Progress Notes (Addendum)
   Subjective:    Patient ID: Carla Gonzalez, female    DOB: 22-Mar-1985, 34 y.o.   MRN: [id number]  HPI 34 year old Female diagnosed with Respiratory Syncytial Virus August 31.  Patient has new baby daughter who has been diagnosed with Respiratory syncytial virus. This patient's mother has  developed RSV as well proven with Respiratory virus panel here in this office.  Patient has been out of work on maternity leave and was supposed to return to work later this week.  However, she is not feeling better.  She has maxillary sinus pressure.  No documented fever.  Has malaise and fatigue and persistent congestion.  Recently treated with Amoxicillin 500 mg 3 times a day for 10 days on August 31.  COVID-19 test was negative. Patient's Respiratory Virus Panel was positive for RSV on August 31st.  Patient denies fever but has maxillary sinus pressure and headache. Likely not up to returning to work just yet.  She is seen virtually today by interactive audio and video telecommunications.  She is identified using 2 identifiers as Carla Gonzalez. [name], a patient in this practice.  She is agreeable to visit in this format today.  She is at home and I am in my office.  Review of Systems see above- no fever chills or discolored sputum.  She has had sinus pressure, malaise, and fatigue.No wheezing     Objective:   Physical Exam  Reports being afebrile.  She is seen virtually today.  She looks fatigued.  She sounds nasally congested on virtual visit.  Does not have any audible wheezing and appears to be in no acute respiratory distress but looks fatigued      Assessment & Plan:  Protracted infection with Respiratory Syncytial Virus  Acute maxillary sinusitis  Plan: She will finish course of amoxicillin and take tapering course of prednisone starting with 6 tablets day 1 and decreasing by 10 mg daily.    A letter will be prepared delaying her return to work for several days.  Copy of letter is in  chart.

## 2019-11-23 NOTE — Progress Notes (Signed)
Done

## 2020-01-23 ENCOUNTER — Other Ambulatory Visit: Payer: Self-pay

## 2020-01-23 ENCOUNTER — Ambulatory Visit: Payer: 59 | Admitting: Internal Medicine

## 2020-01-23 ENCOUNTER — Telehealth: Payer: Self-pay | Admitting: Internal Medicine

## 2020-01-23 ENCOUNTER — Other Ambulatory Visit (HOSPITAL_COMMUNITY): Payer: Self-pay | Admitting: Internal Medicine

## 2020-01-23 ENCOUNTER — Encounter: Payer: Self-pay | Admitting: Internal Medicine

## 2020-01-23 VITALS — BP 110/70 | HR 80 | Temp 97.7°F | Ht 67.0 in | Wt 204.0 lb

## 2020-01-23 DIAGNOSIS — H6501 Acute serous otitis media, right ear: Secondary | ICD-10-CM | POA: Diagnosis not present

## 2020-01-23 DIAGNOSIS — H6692 Otitis media, unspecified, left ear: Secondary | ICD-10-CM

## 2020-01-23 MED ORDER — AMOXICILLIN 250 MG PO CHEW
250.0000 mg | CHEWABLE_TABLET | Freq: Three times a day (TID) | ORAL | 1 refills | Status: DC
Start: 2020-01-23 — End: 2020-09-03

## 2020-01-23 MED FILL — AMOXICILLIN 500 MG CAPSULE: 500 | 10 days supply | Qty: 30 | Fill #0

## 2020-01-23 NOTE — Telephone Encounter (Signed)
Scheduled at 4pm.

## 2020-01-23 NOTE — Telephone Encounter (Signed)
Carla Gonzalez [phone]  [name] Comment called to say for the last 4 days she has been having a throbbing pain in her left ear and also a fullness feeling and now it is starting in her right ear. Her children have just gotten over URI.

## 2020-01-23 NOTE — Telephone Encounter (Signed)
Needs OV today

## 2020-02-04 NOTE — Patient Instructions (Signed)
Take amoxicillin 250 mg 3 times a day for 10 days for acute left otitis media.

## 2020-02-04 NOTE — Progress Notes (Signed)
   Subjective:    Patient ID: Carla Gonzalez, female    DOB: 1985/10/26, 34 y.o.   MRN: [id number]  HPI 34 year old Female seen for complaint of left ear pain.  She called saying he was having a throbbing pain in her left ear for 4 days and a fullness that started in the right ear. Her children had have recent respiratory infections.      Review of Systems no fever, chills, headache, nausea vomiting or dysgeusia.     Objective:   Physical Exam Blood pressure 110/70 pulse 80 temperature 97.7 degrees pulse oximetry 98% weight 204 pounds BMI 31.95  Skin warm and dry.  Pharynx is clear without exudate.  Neck is supple without adenopathy.  Chest is clear to auscultation.  Left TM is full and pink.  Right TM slightly full.       Assessment & Plan:  Acute left otitis media  Plan: Amoxicillin 250 mg 3 times a day for 10 days.

## 2020-02-21 DIAGNOSIS — Z01419 Encounter for gynecological examination (general) (routine) without abnormal findings: Secondary | ICD-10-CM | POA: Diagnosis not present

## 2020-05-18 ENCOUNTER — Encounter: Payer: Self-pay | Admitting: Internal Medicine

## 2020-05-18 ENCOUNTER — Telehealth: Payer: Self-pay | Admitting: Internal Medicine

## 2020-05-18 ENCOUNTER — Ambulatory Visit (INDEPENDENT_AMBULATORY_CARE_PROVIDER_SITE_OTHER): Payer: 59 | Admitting: Internal Medicine

## 2020-05-18 ENCOUNTER — Other Ambulatory Visit: Payer: Self-pay

## 2020-05-18 ENCOUNTER — Other Ambulatory Visit: Payer: Self-pay | Admitting: Internal Medicine

## 2020-05-18 VITALS — HR 88 | Temp 99.3°F

## 2020-05-18 DIAGNOSIS — R0981 Nasal congestion: Secondary | ICD-10-CM | POA: Diagnosis not present

## 2020-05-18 DIAGNOSIS — R059 Cough, unspecified: Secondary | ICD-10-CM | POA: Diagnosis not present

## 2020-05-18 MED ORDER — CEFTRIAXONE SODIUM 1 G IJ SOLR
1.0000 g | Freq: Once | INTRAMUSCULAR | Status: AC
  Administered 2020-05-18: 1 g via INTRAMUSCULAR

## 2020-05-18 MED ORDER — AZITHROMYCIN 250 MG PO TABS: ORAL_TABLET | ORAL | 0 refills | Status: DC

## 2020-05-18 NOTE — Telephone Encounter (Signed)
After talking with DR Renold Genta schedule car visit.

## 2020-05-18 NOTE — Patient Instructions (Addendum)
It was a pleasure to see you today. Rest and keep well hydrated. One gram IM Rocephin given in office. Take Z-pak as directed. Stay out of work tomorrow.

## 2020-05-18 NOTE — Telephone Encounter (Signed)
OK 

## 2020-05-18 NOTE — Progress Notes (Signed)
   Subjective:    Patient ID: Carla Gonzalez, female    DOB: 28-Aug-1985, 35 y.o.   MRN: [id number]  HPI 35 year old Female seen for protracted respiratory infection onset last week. Both children have been ill with respiratory infections.  Youngest child tested negative for Covid but has been getting IM Rocephin for protracted respiratory infection.  Patient took home Covid test that was negative. She has fatigue and malaise in addition to to sore throat, rhinorrhea, cough, postnasal drip.  Had fever this past Saturday of 100 degrees.    Review of Systems no nausea vomiting diarrhea.  Has malaise and fatigue     Objective:   Physical Exam  Pulse 88 and regular temperature 99.3 degrees pulse oximetry 98%.  Patient looks fatigued.  TMs are clear.  Pharynx slightly injected without exudate.  Chest is clear to auscultation without rales or wheezing.  Respiratory virus panel and COVID-19 test obtained by nasal swabs.      Assessment & Plan:  Acute Maxillary sinusitis  Acute viral respiratory infection  Plan: Covid-19 test and Respiratory virus panel obtained. One gram IM Rocephin given in office. Take Zithromax Z-pak  : 2  Tabs by mouth day 1 followed by one tab days 2-5. Rest and stay well hydrated. Out of work tomorrow.  Tylenol as needed for fever.

## 2020-05-18 NOTE — Telephone Encounter (Addendum)
Carla Gonzalez [phone]  [name] called to say on Saturday she started with Sore throat, runny nose, cough, drainage,fever (100.) Done home COVID test negative. It has progressively got worse no fever now.  Child has been to the doctor for the last 3 days getting antibiotics and has tested negative for COVID. Has had 2 COVID vaccines

## 2020-05-19 LAB — SARS-COV-2 RNA,(COVID-19) QUALITATIVE NAAT: SARS CoV2 RNA: NOT DETECTED

## 2020-05-20 LAB — RESPIRATORY VIRUS PANEL
Adenovirus B: NOT DETECTED
HUMAN PARAINFLU VIRUS 1: NOT DETECTED
HUMAN PARAINFLU VIRUS 2: NOT DETECTED
HUMAN PARAINFLU VIRUS 3: NOT DETECTED
INFLUENZA A SUBTYPE H1: NOT DETECTED
INFLUENZA A SUBTYPE H3: NOT DETECTED
Influenza A: NOT DETECTED
Influenza B: NOT DETECTED
Metapneumovirus: NOT DETECTED
Respiratory Syncytial Virus A: NOT DETECTED
Respiratory Syncytial Virus B: NOT DETECTED
Rhinovirus: DETECTED — AB

## 2020-06-25 ENCOUNTER — Other Ambulatory Visit: Payer: Self-pay

## 2020-06-25 ENCOUNTER — Encounter: Payer: Self-pay | Admitting: Physician Assistant

## 2020-06-25 ENCOUNTER — Ambulatory Visit (INDEPENDENT_AMBULATORY_CARE_PROVIDER_SITE_OTHER): Payer: 59 | Admitting: Physician Assistant

## 2020-06-25 DIAGNOSIS — Z808 Family history of malignant neoplasm of other organs or systems: Secondary | ICD-10-CM | POA: Diagnosis not present

## 2020-06-25 DIAGNOSIS — D224 Melanocytic nevi of scalp and neck: Secondary | ICD-10-CM | POA: Diagnosis not present

## 2020-06-25 DIAGNOSIS — Z1283 Encounter for screening for malignant neoplasm of skin: Secondary | ICD-10-CM | POA: Diagnosis not present

## 2020-06-25 DIAGNOSIS — D485 Neoplasm of uncertain behavior of skin: Secondary | ICD-10-CM

## 2020-06-25 NOTE — Patient Instructions (Signed)

## 2020-07-17 ENCOUNTER — Encounter: Payer: Self-pay | Admitting: Physician Assistant

## 2020-07-17 NOTE — Progress Notes (Signed)
   Follow-Up Visit   Subjective  Carla Gonzalez is a 35 y.o. female who presents for the following: Annual Exam (Left neck raised mole patient don't like, x years also DF on left lower leg seen at previous visits. Per patient her dad has history of (2) MM).   The following portions of the chart were reviewed this encounter and updated as appropriate:  Tobacco  Allergies  Meds  Problems  Med Hx  Surg Hx  Fam Hx      Objective  Well appearing patient in no apparent distress; mood and affect are within normal limits.  A focused examination was performed including face, neck, chest ant legs. Relevant physical exam findings are noted in the Assessment and Plan.  left side neck      Assessment & Plan  Neoplasm of uncertain behavior of skin left side neck  Skin / nail biopsy Type of biopsy: tangential   Informed consent: discussed and consent obtained   Timeout: patient name, date of birth, surgical site, and procedure verified   Anesthesia: the lesion was anesthetized in a standard fashion   Anesthetic:  1% lidocaine w/ epinephrine 1-100,000 local infiltration Instrument used: flexible razor blade   Hemostasis achieved with: ferric subsulfate   Outcome: patient tolerated procedure well   Post-procedure details: wound care instructions given    Specimen 1 - Surgical pathology Differential Diagnosis: CN  Check Margins: No    I, Jeffifer Rabold, PA-C, have reviewed all documentation's for this visit.  The documentation on 07/17/20 for the exam, diagnosis, procedures and orders are all accurate and complete.

## 2020-08-27 ENCOUNTER — Telehealth: Payer: 59 | Admitting: Internal Medicine

## 2020-08-27 NOTE — Telephone Encounter (Signed)
Called Carla Gonzalez to let her know I had reached out to cardiology to get her an appointment scheduled with them in the next few days, but I had not heard back yet. She stated she does not want to see anyone there yet. She would rather come and see you and have some labs run to see if maybe its her thyroid or something else, it does not have to be done right away.

## 2020-08-27 NOTE — Telephone Encounter (Signed)
scheduled

## 2020-08-27 NOTE — Telephone Encounter (Signed)
Carla Gonzalez [phone]  [name] Comment called to say she is having some episodes of her heart rate shooting up and her blood pressure especially when she stands, like 120-130 BP 90/60 for the last 4 days. This is like she had last year when she had her baby.

## 2020-09-03 ENCOUNTER — Ambulatory Visit: Payer: 59 | Admitting: Internal Medicine

## 2020-09-03 ENCOUNTER — Other Ambulatory Visit (HOSPITAL_COMMUNITY): Payer: Self-pay

## 2020-09-03 ENCOUNTER — Encounter: Payer: Self-pay | Admitting: Internal Medicine

## 2020-09-03 ENCOUNTER — Other Ambulatory Visit: Payer: Self-pay

## 2020-09-03 VITALS — BP 130/80 | HR 62 | Ht 67.0 in | Wt 202.0 lb

## 2020-09-03 DIAGNOSIS — H6501 Acute serous otitis media, right ear: Secondary | ICD-10-CM

## 2020-09-03 DIAGNOSIS — R5383 Other fatigue: Secondary | ICD-10-CM | POA: Diagnosis not present

## 2020-09-03 DIAGNOSIS — E063 Autoimmune thyroiditis: Secondary | ICD-10-CM | POA: Diagnosis not present

## 2020-09-03 DIAGNOSIS — E038 Other specified hypothyroidism: Secondary | ICD-10-CM | POA: Diagnosis not present

## 2020-09-03 MED ORDER — ALPRAZOLAM 0.5 MG PO TABS
0.2500 mg | ORAL_TABLET | Freq: Three times a day (TID) | ORAL | 0 refills | Status: DC | PRN
  Filled 2020-09-03: qty 60, 20d supply, fill #0

## 2020-09-03 MED ORDER — METHYLPREDNISOLONE ACETATE 80 MG/ML IJ SUSP
80.0000 mg | Freq: Once | INTRAMUSCULAR | Status: AC
  Administered 2020-09-03: 80 mg via INTRAMUSCULAR

## 2020-09-03 MED ORDER — AZITHROMYCIN 250 MG PO TABS
ORAL_TABLET | ORAL | 0 refills | Status: AC
Start: 2020-09-03 — End: 2020-09-08
  Filled 2020-09-03: qty 6, 5d supply, fill #0

## 2020-09-03 NOTE — Progress Notes (Signed)
   Subjective:    Patient ID: Carla Gonzalez, female    DOB: May 25, 1985, 35 y.o.   MRN: [id number]  HPI 35 year old Female seen today acutely with issues with tachycardia and some vague dizziness.  Has had fatigue.  Work is stressful.  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  She has young children.  Husband is supportive.  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  Has noted some intermittent tachycardia and low blood pressure when she rises from sitting to standing.    After she delivered her second child in June 2021, she had some issues with bradycardia with rate 50 bpm on her apple watch.  At one point her heart rate was as low as 30 bpm.  She had CT angio that was negative for PE.  Patient was not on a beta-blocker or taking any herbal medications.  There is no family history of coronary disease or arrhythmia.  She has no history of thyroid abnormality.  Has not had frank syncope.  She had an echocardiogram at that time.  Had normal ejection fraction and normal wall motion.  She had mild mitral valve regurgitation.  Normal ejection fraction.  Was thought to have dilated pulmonary artery (mild) at 34.5.  Inferior vena cava was noted to be dilated.    Review of Systems see above no syncope, no nausea or vomiting or headaches     Objective:   Physical Exam Vital signs reviewed.  Today blood pressure is 140/88 lying and increases to 150/100 sitting and standing.  Pulse is 64 lying and increases to 76 standing.  Pulse oximetry is between 98 and 96%.  BMI 31.64.  There are no focal deficits on brief neurological exam.  Affect thought and judgment are normal.  She has no JVD.  No carotid bruits.  Chest is clear.  Cardiac exam is completely normal without murmurs or ectopy.  No clicks or rubs appreciated.       Assessment & Plan:   Carla Gonzalez is slightly elevated at 5.12 she will be started on low-dose thyroid replacement medication with follow-up in 6 weeks.  She will take  levothyroxine 50 mcg daily.  For stress and anxiety given Xanax 0.5 mg to take 1/2 to 1 tablet up to 3 times daily if needed when overwhelmed.  Follow-up here in 6 weeks with regard to the symptoms and hypothyroidism.  Will need TSH rechecked.  Take Zithromax 2 tabs day 1 followed by 1 tab days 2 through 5 For serous otitis media

## 2020-09-04 ENCOUNTER — Telehealth: Payer: Self-pay | Admitting: Internal Medicine

## 2020-09-04 ENCOUNTER — Other Ambulatory Visit (HOSPITAL_COMMUNITY): Payer: Self-pay

## 2020-09-04 LAB — T4, FREE: Free T4: 1 ng/dL (ref 0.8–1.8)

## 2020-09-04 LAB — CBC WITH DIFFERENTIAL/PLATELET
Absolute Monocytes: 478 cells/uL (ref 200–950)
Basophils Absolute: 46 cells/uL (ref 0–200)
Basophils Relative: 0.5 %
Eosinophils Absolute: 294 cells/uL (ref 15–500)
Eosinophils Relative: 3.2 %
HCT: 42.6 % (ref 35.0–45.0)
Hemoglobin: 14.2 g/dL (ref 11.7–15.5)
Lymphs Abs: 2861 cells/uL (ref 850–3900)
MCH: 29.5 pg (ref 27.0–33.0)
MCHC: 33.3 g/dL (ref 32.0–36.0)
MCV: 88.6 fL (ref 80.0–100.0)
MPV: 9.5 fL (ref 7.5–12.5)
Monocytes Relative: 5.2 %
Neutro Abs: 5520 cells/uL (ref 1500–7800)
Neutrophils Relative %: 60 %
Platelets: 308 10*3/uL (ref 140–400)
RBC: 4.81 10*6/uL (ref 3.80–5.10)
RDW: 12.8 % (ref 11.0–15.0)
Total Lymphocyte: 31.1 %
WBC: 9.2 10*3/uL (ref 3.8–10.8)

## 2020-09-04 LAB — COMPLETE METABOLIC PANEL WITH GFR
AG Ratio: 1.9 (calc) (ref 1.0–2.5)
ALT: 8 U/L (ref 6–29)
AST: 13 U/L (ref 10–30)
Albumin: 4.8 g/dL (ref 3.6–5.1)
Alkaline phosphatase (APISO): 85 U/L (ref 31–125)
BUN: 12 mg/dL (ref 7–25)
CO2: 26 mmol/L (ref 20–32)
Calcium: 9.5 mg/dL (ref 8.6–10.2)
Chloride: 102 mmol/L (ref 98–110)
Creat: 0.69 mg/dL (ref 0.50–1.10)
GFR, Est African American: 134 mL/min/{1.73_m2} (ref >=60)
GFR, Est Non African American: 115 mL/min/{1.73_m2} (ref >=60)
Globulin: 2.5 g/dL (calc) (ref 1.9–3.7)
Glucose, Bld: 93 mg/dL (ref 65–99)
Potassium: 4 mmol/L (ref 3.5–5.3)
Sodium: 139 mmol/L (ref 135–146)
Total Bilirubin: 0.4 mg/dL (ref 0.2–1.2)
Total Protein: 7.3 g/dL (ref 6.1–8.1)

## 2020-09-04 LAB — VITAMIN B12: Vitamin B-12: 428 pg/mL (ref 200–1100)

## 2020-09-04 LAB — TSH: TSH: 5.12 mIU/L — ABNORMAL HIGH

## 2020-09-04 MED ORDER — LEVOTHYROXINE SODIUM 50 MCG PO TABS
50.0000 ug | ORAL_TABLET | Freq: Every day | ORAL | 0 refills | Status: DC
Start: 2020-09-04 — End: 2020-10-13
  Filled 2020-09-04: qty 90, 90d supply, fill #0

## 2020-09-04 NOTE — Telephone Encounter (Signed)
TSH is mildly elevated. Will start low dose thyroid replacement with follow up TSH and OV in 6 weeks.

## 2020-10-03 NOTE — Patient Instructions (Addendum)
I am sorry you are not feeling well.  Please start low-dose thyroid replacement medication.  Take this on an empty stomach.  Follow-up here in 6 weeks with office visit and TSH.  May take Xanax sparingly for anxiety as directed.  Try to stay well-hydrated at work and take breaks if possible.  Take Zithromax Z-PAK 2 tabs day 1 followed by 1 tab days 2 through 5 for serous otitis media.  Labs drawn and pending.

## 2020-10-12 ENCOUNTER — Other Ambulatory Visit: Payer: Self-pay

## 2020-10-12 ENCOUNTER — Other Ambulatory Visit: Payer: 59 | Admitting: Internal Medicine

## 2020-10-12 DIAGNOSIS — E063 Autoimmune thyroiditis: Secondary | ICD-10-CM

## 2020-10-12 DIAGNOSIS — E038 Other specified hypothyroidism: Secondary | ICD-10-CM | POA: Diagnosis not present

## 2020-10-12 LAB — TSH: TSH: 2.64 mIU/L

## 2020-10-13 ENCOUNTER — Telehealth: Payer: Self-pay | Admitting: Internal Medicine

## 2020-10-13 ENCOUNTER — Other Ambulatory Visit (HOSPITAL_COMMUNITY): Payer: Self-pay

## 2020-10-13 MED ORDER — LEVOTHYROXINE SODIUM 75 MCG PO TABS
75.0000 ug | ORAL_TABLET | Freq: Every day | ORAL | 0 refills | Status: DC
  Filled 2020-10-13: qty 90, 90d supply, fill #0

## 2020-10-13 NOTE — Telephone Encounter (Signed)
Phone call to patient. Would like to increase dose of levothyroxine to 0.075 mg daily trying to get TSH to be ideal at 1. It has improved at 2.64 on Levothyroxine 0,05 mg daily. Make change to 0.075 mg daily and follow up in 6 weeks with TSH only here. MJB, MD

## 2020-11-10 ENCOUNTER — Other Ambulatory Visit (HOSPITAL_COMMUNITY): Payer: Self-pay

## 2020-11-10 ENCOUNTER — Ambulatory Visit: Payer: 59 | Admitting: Internal Medicine

## 2020-11-10 ENCOUNTER — Other Ambulatory Visit: Payer: Self-pay

## 2020-11-10 ENCOUNTER — Telehealth: Payer: Self-pay | Admitting: Internal Medicine

## 2020-11-10 ENCOUNTER — Encounter: Payer: Self-pay | Admitting: Internal Medicine

## 2020-11-10 VITALS — BP 118/82 | HR 76 | Ht 67.0 in | Wt 198.0 lb

## 2020-11-10 DIAGNOSIS — L509 Urticaria, unspecified: Secondary | ICD-10-CM | POA: Diagnosis not present

## 2020-11-10 DIAGNOSIS — E039 Hypothyroidism, unspecified: Secondary | ICD-10-CM | POA: Diagnosis not present

## 2020-11-10 MED ORDER — METHYLPREDNISOLONE 4 MG PO TBPK
ORAL_TABLET | ORAL | 0 refills | Status: DC
  Filled 2020-11-10: qty 21, 6d supply, fill #0

## 2020-11-10 MED ORDER — AZITHROMYCIN 250 MG PO TABS
ORAL_TABLET | ORAL | 0 refills | Status: AC
  Filled 2020-11-10: qty 6, 5d supply, fill #0

## 2020-11-10 NOTE — Telephone Encounter (Signed)
Scheduled for 4.30

## 2020-11-10 NOTE — Telephone Encounter (Signed)
Pt called and said that she recently had her levothyroxine increased from 50 to '75mg'$  and that she has been having hives and she said they are itchy, she said it shows up about 2 hours after she takes the medicine in the morning. She also said that it was originally white but that the new pills are gray. Please advise

## 2020-11-10 NOTE — Progress Notes (Signed)
   Subjective:    Patient ID: Carla Gonzalez, female    DOB: 02-13-1986, 35 y.o.   MRN: [id number]  HPI 35 year old Female recently had COVID-19 and is here regarding urticaria.  Patient went on a family vacation to [place] and multiple family members came down with COVID-19.  She has recovered from the acute viral syndrome but has urticaria.   Also, in June had elevated TSH of 5.12 and began taking levothyroxine 50 mg daily. In August TSH had imptoved to 2.64. I would like to see TSH around 1. Levothyroxine was increased to 0.075 mg August 9th. We consiedered rechecking it today but it has not yet been 6 weeks. She feels better on this dose already. We will check it at [facility] in November.  TSH improved to 2.64.    Review of Systems Has post nasal drip and annoying cough. No discolored nasal drainage now but did have      Objective:   Physical Exam   I do not see a significant rash. No thyromegaly.   BP 118/82 pulse 76 pulse ox 97% weight 198 pounds BMI 31.01      Assessment & Plan:  Urticaria- likely related to having had Covid-19. Take Medrol 4 mg 6 days dosepak. Call if symptoms do not improve.  Hypothyroidism. Follow up at [facility] in November. Continue Levothyroxine 0.075 mg daily.

## 2020-11-22 ENCOUNTER — Encounter: Payer: Self-pay | Admitting: Internal Medicine

## 2020-11-22 NOTE — Patient Instructions (Signed)
Take 6 day Medrol dosepack in tapering course for urticaria. Continue Levothyroxine 0.075 mg daily. Follow up at [facility] in late November.

## 2020-12-09 ENCOUNTER — Other Ambulatory Visit (HOSPITAL_COMMUNITY): Payer: Self-pay

## 2020-12-10 ENCOUNTER — Telehealth: Payer: Self-pay | Admitting: Internal Medicine

## 2020-12-10 ENCOUNTER — Other Ambulatory Visit: Payer: Self-pay

## 2020-12-10 ENCOUNTER — Other Ambulatory Visit (HOSPITAL_COMMUNITY): Payer: Self-pay

## 2020-12-10 ENCOUNTER — Telehealth (INDEPENDENT_AMBULATORY_CARE_PROVIDER_SITE_OTHER): Payer: 59 | Admitting: Internal Medicine

## 2020-12-10 ENCOUNTER — Encounter: Payer: Self-pay | Admitting: Internal Medicine

## 2020-12-10 VITALS — BP 118/62

## 2020-12-10 DIAGNOSIS — J0100 Acute maxillary sinusitis, unspecified: Secondary | ICD-10-CM | POA: Diagnosis not present

## 2020-12-10 MED ORDER — HYDROCODONE BIT-HOMATROP MBR 5-1.5 MG/5ML PO SOLN
5.0000 mL | Freq: Three times a day (TID) | ORAL | 0 refills | Status: DC | PRN
  Filled 2020-12-10: qty 120, 8d supply, fill #0

## 2020-12-10 MED ORDER — AMOXICILLIN-POT CLAVULANATE 500-125 MG PO TABS
1.0000 | ORAL_TABLET | Freq: Three times a day (TID) | ORAL | 0 refills | Status: DC
  Filled 2020-12-10: qty 30, 10d supply, fill #0

## 2020-12-10 NOTE — Telephone Encounter (Signed)
scheduled

## 2020-12-10 NOTE — Progress Notes (Signed)
   Subjective:    Patient ID: Carla Gonzalez, female    DOB: 1985-09-01, 35 y.o.   MRN: [id number]  HPI Her 2 children have been ill recently with respiratory infections.  Patient did take a COVID test that was negative.  Children have had ear infections.  Patient has  a 9 day history of respiratory infection symptoms that have worsened since Friday.  She reports maxillary sinus pressure.  Her pulse oximetry has been 98%.  She has thick nasal drainage.  Cough is productive.  No fever or shaking chills.  Patient had COVID-19 this past summer Summer when she traveled to [place] with several family members.  She has a history of hypothyroidism and is on thyroid replacement medication.  She had follow-up drainage and low-grade fever when one of her children was sick in March 2022.  One of her children just recently started in a daycare/preschool program.  Recently when she took a Z-Pak she had some abdominal cramping so she would like a different antibiotic.  Patient requested a virtual visit today.  Interactive audio and video telecommunications were attempted but failed despite attempts at audiovisual connection due to lack of audio capability.    Therefore, we continued by telephone only.   Patient is at her work at [employer] and I am in my office.  She is agreeable to visit in this format today.  She is identified using 2 identifiers as Carla Gonzalez. [name], a patient in this practice  Review of Systems no nausea, vomiting, diarrhea, fever, shaking chills or myalgias     Objective:   Physical Exam She reports being afebrile with blood pressure 118/62, pulse oximetry 98%.  She was seen via video telecommunications but we could not connect with audio therefore we converted to telephone visit only.  She was seen on video in no acute distress.       Assessment & Plan:  Acute maxillary sinusitis  Plan: Augmentin 500 mg 3 times a day for 10 days.  Hycodan 1 teaspoon every 8 hours as  needed for cough.  Rest and drink plenty of fluids.  Time spent with telephone consultation is 10 minutes

## 2020-12-10 NOTE — Patient Instructions (Addendum)
Augmentin 500 mg 3 times a day for 10 days for acute maxillary sinusitis.  Rest and drink fluids.  Call if symptoms not improving.

## 2020-12-10 NOTE — Telephone Encounter (Signed)
Carla Gonzalez [phone]  [name] Comment called to say she has congestion, productive cough, headaches, sinus pressure for 9 days. She has taking Dayquil, Nyquil and ibuprofen not getting better. No congestion in her chest. She did home COVID test on Tuesday and it was negative. She has had at least 2 vaccines and had COVID 2 months ago. She would like a virtual visit

## 2020-12-28 DIAGNOSIS — Z113 Encounter for screening for infections with a predominantly sexual mode of transmission: Secondary | ICD-10-CM | POA: Diagnosis not present

## 2020-12-28 DIAGNOSIS — R102 Pelvic and perineal pain: Secondary | ICD-10-CM | POA: Diagnosis not present

## 2021-01-07 ENCOUNTER — Telehealth: Payer: Self-pay | Admitting: Internal Medicine

## 2021-01-07 NOTE — Telephone Encounter (Signed)
[name] [phone]  [name] Comment called to say she has less than 2 weeks of her below medication, do you want her to come in for labs, she has a CPE lab appointment scheduled for 01/26/2021 and CPE 02/02/2021.

## 2021-01-08 ENCOUNTER — Other Ambulatory Visit (HOSPITAL_COMMUNITY): Payer: Self-pay

## 2021-01-08 MED ORDER — LEVOTHYROXINE SODIUM 75 MCG PO TABS
75.0000 ug | ORAL_TABLET | Freq: Every day | ORAL | 0 refills | Status: DC
  Filled 2021-01-08 – 2021-01-18 (×2): qty 30, 30d supply, fill #0

## 2021-01-08 NOTE — Telephone Encounter (Signed)
Refilled #[phone]

## 2021-01-18 ENCOUNTER — Other Ambulatory Visit (HOSPITAL_COMMUNITY): Payer: Self-pay

## 2021-01-18 DIAGNOSIS — R102 Pelvic and perineal pain: Secondary | ICD-10-CM | POA: Diagnosis not present

## 2021-01-18 MED ORDER — NORETHIN ACE-ETH ESTRAD-FE 1-20 MG-MCG PO TABS
1.0000 | ORAL_TABLET | Freq: Every day | ORAL | 4 refills | Status: DC
  Filled 2021-01-18: qty 84, 84d supply, fill #0
  Filled 2021-03-26 – 2021-03-29 (×2): qty 84, 84d supply, fill #1
  Filled 2021-07-06: qty 84, 84d supply, fill #2

## 2021-01-21 ENCOUNTER — Encounter: Payer: Self-pay | Admitting: Internal Medicine

## 2021-01-26 ENCOUNTER — Other Ambulatory Visit: Payer: 59 | Admitting: Internal Medicine

## 2021-01-26 ENCOUNTER — Other Ambulatory Visit: Payer: Self-pay

## 2021-01-26 DIAGNOSIS — E039 Hypothyroidism, unspecified: Secondary | ICD-10-CM

## 2021-01-26 DIAGNOSIS — Z0000 Encounter for general adult medical examination without abnormal findings: Secondary | ICD-10-CM

## 2021-01-26 DIAGNOSIS — Z13220 Encounter for screening for lipoid disorders: Secondary | ICD-10-CM

## 2021-01-27 LAB — CBC WITH DIFFERENTIAL/PLATELET
Absolute Monocytes: 450 cells/uL (ref 200–950)
Basophils Absolute: 47 cells/uL (ref 0–200)
Basophils Relative: 0.6 %
Eosinophils Absolute: 269 cells/uL (ref 15–500)
Eosinophils Relative: 3.4 %
HCT: 39.6 % (ref 35.0–45.0)
Hemoglobin: 13 g/dL (ref 11.7–15.5)
Lymphs Abs: 2331 cells/uL (ref 850–3900)
MCH: 29.3 pg (ref 27.0–33.0)
MCHC: 32.8 g/dL (ref 32.0–36.0)
MCV: 89.2 fL (ref 80.0–100.0)
MPV: 9.7 fL (ref 7.5–12.5)
Monocytes Relative: 5.7 %
Neutro Abs: 4803 cells/uL (ref 1500–7800)
Neutrophils Relative %: 60.8 %
Platelets: 274 10*3/uL (ref 140–400)
RBC: 4.44 10*6/uL (ref 3.80–5.10)
RDW: 12.7 % (ref 11.0–15.0)
Total Lymphocyte: 29.5 %
WBC: 7.9 10*3/uL (ref 3.8–10.8)

## 2021-01-27 LAB — COMPLETE METABOLIC PANEL WITH GFR
AG Ratio: 1.9 (calc) (ref 1.0–2.5)
ALT: 8 U/L (ref 6–29)
AST: 11 U/L (ref 10–30)
Albumin: 4.5 g/dL (ref 3.6–5.1)
Alkaline phosphatase (APISO): 77 U/L (ref 31–125)
BUN: 12 mg/dL (ref 7–25)
CO2: 26 mmol/L (ref 20–32)
Calcium: 9 mg/dL (ref 8.6–10.2)
Chloride: 105 mmol/L (ref 98–110)
Creat: 0.75 mg/dL (ref 0.50–0.97)
Globulin: 2.4 g/dL (calc) (ref 1.9–3.7)
Glucose, Bld: 83 mg/dL (ref 65–99)
Potassium: 4.1 mmol/L (ref 3.5–5.3)
Sodium: 139 mmol/L (ref 135–146)
Total Bilirubin: 0.7 mg/dL (ref 0.2–1.2)
Total Protein: 6.9 g/dL (ref 6.1–8.1)
eGFR: 108 mL/min/{1.73_m2} (ref >=60)

## 2021-01-27 LAB — LIPID PANEL
Cholesterol: 138 mg/dL (ref <200)
HDL: 51 mg/dL (ref >=50)
LDL Cholesterol (Calc): 73 mg/dL (calc)
Non-HDL Cholesterol (Calc): 87 mg/dL (calc) (ref <130)
Total CHOL/HDL Ratio: 2.7 (calc) (ref <5.0)
Triglycerides: 67 mg/dL (ref <150)

## 2021-01-27 LAB — TSH: TSH: 1.9 mIU/L

## 2021-02-02 ENCOUNTER — Encounter: Payer: Self-pay | Admitting: Internal Medicine

## 2021-02-02 ENCOUNTER — Other Ambulatory Visit: Payer: Self-pay

## 2021-02-02 ENCOUNTER — Other Ambulatory Visit (HOSPITAL_COMMUNITY): Payer: Self-pay

## 2021-02-02 ENCOUNTER — Ambulatory Visit (INDEPENDENT_AMBULATORY_CARE_PROVIDER_SITE_OTHER): Payer: 59 | Admitting: Internal Medicine

## 2021-02-02 VITALS — BP 120/72 | HR 60 | Temp 98.9°F | Ht 67.0 in | Wt 200.0 lb

## 2021-02-02 DIAGNOSIS — Z0000 Encounter for general adult medical examination without abnormal findings: Secondary | ICD-10-CM | POA: Diagnosis not present

## 2021-02-02 DIAGNOSIS — E038 Other specified hypothyroidism: Secondary | ICD-10-CM

## 2021-02-02 DIAGNOSIS — R42 Dizziness and giddiness: Secondary | ICD-10-CM | POA: Diagnosis not present

## 2021-02-02 DIAGNOSIS — E063 Autoimmune thyroiditis: Secondary | ICD-10-CM

## 2021-02-02 MED ORDER — LEVOTHYROXINE SODIUM 75 MCG PO TABS
75.0000 ug | ORAL_TABLET | Freq: Every day | ORAL | 1 refills | Status: DC
Start: 2021-02-02 — End: 2021-02-02
  Filled 2021-02-02: qty 90, 90d supply, fill #0

## 2021-02-02 MED ORDER — LEVOTHYROXINE SODIUM 75 MCG PO TABS
75.0000 ug | ORAL_TABLET | Freq: Every day | ORAL | 0 refills | Status: DC
Start: 2021-02-02 — End: 2021-05-18
  Filled 2021-02-02 – 2021-02-18 (×2): qty 90, 90d supply, fill #0

## 2021-02-02 MED ORDER — MECLIZINE HCL 25 MG PO TABS
25.0000 mg | ORAL_TABLET | Freq: Three times a day (TID) | ORAL | 0 refills | Status: DC | PRN
  Filled 2021-02-02: qty 30, 10d supply, fill #0

## 2021-02-02 NOTE — Progress Notes (Signed)
Subjective:    Patient ID: Carla Gonzalez, female    DOB: Apr 23, 1985, 35 y.o.   MRN: [id number]  HPI 35 year old Female seen for health maintenance exam and evaluation of medical issues.  She sent MyChart message on November 17 indicating she had developed recurrent dizziness.  Has had similar symptoms before starting thyroid replacement medication for hypothyroidism.  Episodes that started about a week previous to November 17.  Seem to start around 9:00 in the morning.  Had issues throughout the day.  In June 2022 she was noted to have issues with tachycardia and some vague dizziness with fatigue.  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  She is married and has 2 small children.  She had respiratory syncytial virus November 05, 2019.  She is tested positive for rhinovirus in March 2022, had melanocytic nevus removed from left neck by dermatologist April 2022.  Had acute maxillary sinusitis in March 2022.  Had COVID-19 along with several members of her family on vacation in [place] late Summer 2022.  In June 2021, she had recently given birth via C-section and subsequently developed some chest tightness associated with bradycardia for 2 days.  She noticed heart rate dropping to the 30s on her apple watch and she went to [facility] emergency department.  She had bradycardia with sinus arrhythmia with rate of 42.  BNP was mildly elevated at 270 and chest x-ray shows mild cardiomegaly with increased attenuation at the lung bases.  CTA was negative for PE but noted to have dilated main pulmonary artery with diameter of 3.8 cm.  CBC and electrolytes were normal.  She was admitted to the hospital and seen by Dr. Rayann Heman who felt that patient had increased vagal tone with recent pregnancy.  She was given 1 dose IV Lasix with improvement of throat fullness sensation.  Echo was within normal limits she has not had  recurrence.  She works long hours.  Her husband is supportive.    Children have been ill with respiratory infections recently.  She tries to exercise regularly by riding a stationary bike.  Recent labs are within normal limits and TSH on thyroid replacement medication is excellent at 1.90  Vertigo seems to be occurring at work.  She does not smoke or consume alcohol.  Family history remarkable for father with history of melanoma and hypertension.  She had a benign nevus removed from left neck spring 2022.  Patient has had appendectomy and C-section, tonsillectomy and adenoidectomy  In November 2021, she had acute left otitis media.  In March 2022, she had protracted respiratory infection but children were ill.  Review of Systems Has had some pressure in ears. Does not have tinnitus. Can last hours. Does not happen  Having dysmenorrhea- back on Loestrin per GYN  Apparently vertigo symptoms get worse after she arrives at work sometime around midmorning.  Have labs are reviewed and are entirely within normal limits including fasting glucose, BUN and creatinine, c-Met, TSH and CBC as well as lipid panel    Objective:   Physical Exam Vital signs reviewed.  Skin is warm and dry.  No cervical adenopathy.  No thyromegaly.  No carotid bruits.  Chest is clear.  Breast are without masses.  Cardiac exam: Regular rate and rhythm without ectopy.  Abdomen is soft nondistended without hepatosplenomegaly masses or tenderness.  Brief neurological exam shows slight rightward nystagmus.  PERRLA.  She is alert and oriented x3.  Muscle strength is normal in upper and lower extremities.  Affect thought and judgment are normal.       Assessment & Plan:  Rightward nystagmus-I think this is probably benign positional vertigo which may be aggravated by stress.  This is alarming to her because it comes on without warning and makes her feel a bit out of control.  She has a stressful job.  She has 2 children  and sometimes they were sick but her husband is quite supportive.  I would like for her to see Neurologist.  She does not have tinnitus with these episodes.  Hypothyroidism-stable on thyroid replacement medication  Plan: I would like for her to try Antivert 25 mg up to 3 times daily as needed for acute vertigo.  I think this helps with vertigo and will not cause extreme drowsiness.  The other option is a benzodiazepine.  We will ask for Neurology consultation.

## 2021-02-02 NOTE — Patient Instructions (Addendum)
Request Neurology consultation for vertigo.  Continue same dose of thyroid replacement medication.  Follow-up in 6 months.  Try meclizine for vertigo.

## 2021-02-03 ENCOUNTER — Other Ambulatory Visit (HOSPITAL_COMMUNITY): Payer: Self-pay

## 2021-02-15 ENCOUNTER — Telehealth: Payer: Self-pay

## 2021-02-15 ENCOUNTER — Telehealth: Payer: 59 | Admitting: Emergency Medicine

## 2021-02-15 ENCOUNTER — Other Ambulatory Visit (HOSPITAL_COMMUNITY): Payer: Self-pay

## 2021-02-15 DIAGNOSIS — J069 Acute upper respiratory infection, unspecified: Secondary | ICD-10-CM | POA: Diagnosis not present

## 2021-02-15 MED ORDER — BENZONATATE 100 MG PO CAPS
100.0000 mg | ORAL_CAPSULE | Freq: Two times a day (BID) | ORAL | 0 refills | Status: DC | PRN
  Filled 2021-02-15: qty 20, 10d supply, fill #0

## 2021-02-15 MED ORDER — AZELASTINE HCL 0.1 % NA SOLN
2.0000 | Freq: Two times a day (BID) | NASAL | 0 refills | Status: DC
  Filled 2021-02-15: qty 30, 25d supply, fill #0

## 2021-02-15 NOTE — Telephone Encounter (Signed)
Patient called asking for appointment. She states that she has head pressure and congestion. She states that her kids were sick last week. She denies cough and fever. She did take a covid test and it was negative. She has been advised Provider is out of the office to go to an urgent care or do a virtual visit with another provider through my chart. She has agreed.

## 2021-02-15 NOTE — Progress Notes (Signed)
E-Visit for Upper Respiratory Infection   We are sorry you are not feeling well.  Here is how we plan to help!  *If you need a note for work, please let me know.   Based on what you have shared with me, it looks like you may have a viral upper respiratory infection.  Upper respiratory infections are caused by a large number of viruses; however, rhinovirus is the most common cause.   Symptoms vary from person to person, with common symptoms including sore throat, cough, fatigue or lack of energy and feeling of general discomfort.  A low-grade fever of up to 100.4 may present, but is often uncommon.  Symptoms vary however, and are closely related to a person's age or underlying illnesses.  The most common symptoms associated with an upper respiratory infection are nasal discharge or congestion, cough, sneezing, headache and pressure in the ears and face.  These symptoms usually persist for about 3 to 10 days, but can last up to 2 weeks.  It is important to know that upper respiratory infections do not cause serious illness or complications in most cases.    Upper respiratory infections can be transmitted from person to person, with the most common method of transmission being a person's hands.  The virus is able to live on the skin and can infect other persons for up to 2 hours after direct contact.  Also, these can be transmitted when someone coughs or sneezes; thus, it is important to cover the mouth to reduce this risk.  To keep the spread of the illness at [place], good hand hygiene is very important.  This is an infection that is most likely caused by a virus. There are no specific treatments other than to help you with the symptoms until the infection runs its course.  We are sorry you are not feeling well.  Here is how we plan to help!   For nasal congestion, you may use an oral decongestants such as Mucinex D or if you have glaucoma or high blood pressure use plain Mucinex.  Saline nasal spray or  nasal drops can help and can safely be used as often as needed for congestion.  For your congestion, I have prescribed Azelastine nasal spray two sprays in each nostril twice a day  If you do not have a history of heart disease, hypertension, diabetes or thyroid disease, prostate/bladder issues or glaucoma, you may also use Sudafed to treat nasal congestion.  It is highly recommended that you consult with a pharmacist or your primary care physician to ensure this medication is safe for you to take.     If you have a cough, you may use cough suppressants such as Delsym and Robitussin.  If you have glaucoma or high blood pressure, you can also use Coricidin HBP.   For cough I have prescribed for you A prescription cough medication called Tessalon Perles 100 mg. You may take 1-2 capsules every 8 hours as needed for cough  If you have a sore or scratchy throat, use a saltwater gargle-  to  teaspoon of salt dissolved in a 4-ounce to 8-ounce glass of warm water.  Gargle the solution for approximately 15-30 seconds and then spit.  It is important not to swallow the solution.  You can also use throat lozenges/cough drops and Chloraseptic spray to help with throat pain or discomfort.  Warm or cold liquids can also be helpful in relieving throat pain.  For headache, pain or general discomfort,  you can use Ibuprofen or Tylenol as directed.   Some authorities believe that zinc sprays or the use of Echinacea may shorten the course of your symptoms.   HOME CARE Only take medications as instructed by your medical team. Be sure to drink plenty of fluids. Water is fine as well as fruit juices, sodas and electrolyte beverages. You may want to stay away from caffeine or alcohol. If you are nauseated, try taking small sips of liquids. How do you know if you are getting enough fluid? Your urine should be a pale yellow or almost colorless. Get rest. Taking a steamy shower or using a humidifier may help nasal congestion  and ease sore throat pain. You can place a towel over your head and breathe in the steam from hot water coming from a faucet. Using a saline nasal spray works much the same way. Cough drops, hard candies and sore throat lozenges may ease your cough. Avoid close contacts especially the very young and the elderly Cover your mouth if you cough or sneeze Always remember to wash your hands.   GET HELP RIGHT AWAY IF: You develop worsening fever. If your symptoms do not improve within 10 days You develop yellow or green discharge from your nose over 3 days. You have coughing fits You develop a severe head ache or visual changes. You develop shortness of breath, difficulty breathing or start having chest pain Your symptoms persist after you have completed your treatment plan  MAKE SURE YOU  Understand these instructions. Will watch your condition. Will get help right away if you are not doing well or get worse.  Thank you for choosing an e-visit.  Your e-visit answers were reviewed by a board certified advanced clinical practitioner to complete your personal care plan. Depending upon the condition, your plan could have included both over the counter or prescription medications.  Please review your pharmacy choice. Make sure the pharmacy is open so you can pick up prescription now. If there is a problem, you may contact your provider through [employer] and have the prescription routed to another pharmacy.  Your safety is important to [place]. If you have drug allergies check your prescription carefully.   For the next 24 hours you can use MyChart to ask questions about today's visit, request a non-urgent call back, or ask for a work or school excuse. You will get an email in the next two days asking about your experience. I hope that your e-visit has been valuable and will speed your recovery.  I have spent 5 minutes in review of e-visit questionnaire, review and updating patient chart,  medical decision making and response to patient.   Willeen Cass, PhD, FNP-BC

## 2021-02-18 ENCOUNTER — Other Ambulatory Visit (HOSPITAL_COMMUNITY): Payer: Self-pay

## 2021-02-19 ENCOUNTER — Other Ambulatory Visit (HOSPITAL_COMMUNITY): Payer: Self-pay

## 2021-02-22 DIAGNOSIS — H524 Presbyopia: Secondary | ICD-10-CM | POA: Diagnosis not present

## 2021-02-23 ENCOUNTER — Other Ambulatory Visit: Payer: Self-pay

## 2021-02-23 ENCOUNTER — Other Ambulatory Visit (HOSPITAL_COMMUNITY): Payer: Self-pay

## 2021-02-23 ENCOUNTER — Ambulatory Visit: Payer: 59 | Admitting: Internal Medicine

## 2021-02-23 ENCOUNTER — Encounter: Payer: Self-pay | Admitting: Internal Medicine

## 2021-02-23 ENCOUNTER — Telehealth: Payer: Self-pay | Admitting: Internal Medicine

## 2021-02-23 VITALS — BP 128/84 | Temp 98.4°F

## 2021-02-23 DIAGNOSIS — H6503 Acute serous otitis media, bilateral: Secondary | ICD-10-CM | POA: Diagnosis not present

## 2021-02-23 MED ORDER — CEFTRIAXONE SODIUM 1 G IJ SOLR
1.0000 g | Freq: Once | INTRAMUSCULAR | Status: AC
  Administered 2021-02-23: 17:00:00 1 g via INTRAMUSCULAR

## 2021-02-23 NOTE — Progress Notes (Signed)
° °  Subjective:    Patient ID: Shontae Rosiles, female    DOB: 01/14/1986, 35 y.o.   MRN: [id number]  HPI 35 year old Female here with complaint of right ear pain and pressure, sinus congestion and throat drainage.  Symptoms of been present for 2 weeks.  She did an E-visit but only Astelin nasal spray was prescribed.  COVID test has been negative.  Children have been sick to do their COVID tests and flu tests have been negative.  Patient has considerable fatigue.  Work is hard.  Children have been back and forth to the doctor.    Review of Systems see above-denies fever or shaking chills significant cough.  Interestingly vertigo has improved a bit.  She has appointment with Dr. Jaynee Eagles soon.     Objective:   Physical Exam Temperature 98.4 degrees pulse oximetry 98% blood pressure 128/84 Skin warm and dry.  Right TM is dull.  Left TM okay.  Pharynx slightly injected without exudate.  Neck is supple.  Chest clear.      Assessment & Plan:  Right serous otitis media  Plan: Doxycycline 100 mg twice daily for 10 days.  1 g IM Rocephin given in the office.  Out of work tomorrow.  Vertigo-has appointment with Dr.Ahern soon.  Rest and drink fluids.

## 2021-02-23 NOTE — Telephone Encounter (Signed)
Carla Gonzalez [phone]  Carla Gonzalez called to say she is still having Throat drainage, right ear pain and pressure, sinus congestion, All on right side,  Been going on for 2 weeks, she did evisit when she first got sick, not much help. COVID test was negative. Children have been sick too, their COVID test and Flu test have been negative also.

## 2021-02-23 NOTE — Telephone Encounter (Signed)
scheduled

## 2021-02-23 NOTE — Patient Instructions (Addendum)
We are sorry you are not feeling well. One gram IM Rocephin given in office. Doxycycline 100 mg twice daily for 10 days. Out of work tomorrow.

## 2021-02-25 ENCOUNTER — Telehealth: Payer: Self-pay | Admitting: Internal Medicine

## 2021-02-25 ENCOUNTER — Other Ambulatory Visit (HOSPITAL_COMMUNITY): Payer: Self-pay

## 2021-02-25 MED ORDER — DOXYCYCLINE HYCLATE 100 MG PO TABS
100.0000 mg | ORAL_TABLET | Freq: Two times a day (BID) | ORAL | 0 refills | Status: DC
Start: 2021-02-25 — End: 2021-03-09
  Filled 2021-02-25: qty 20, 10d supply, fill #0

## 2021-02-25 NOTE — Telephone Encounter (Signed)
RX for Doxycycline send to [facility]

## 2021-02-25 NOTE — Telephone Encounter (Signed)
I have sent in Doxycycline today.

## 2021-03-04 ENCOUNTER — Encounter: Payer: Self-pay | Admitting: Neurology

## 2021-03-04 ENCOUNTER — Other Ambulatory Visit (HOSPITAL_COMMUNITY): Payer: Self-pay

## 2021-03-04 ENCOUNTER — Ambulatory Visit: Payer: 59 | Admitting: Neurology

## 2021-03-04 VITALS — BP 125/86 | HR 72 | Ht 67.0 in | Wt 205.0 lb

## 2021-03-04 DIAGNOSIS — H6591 Unspecified nonsuppurative otitis media, right ear: Secondary | ICD-10-CM | POA: Diagnosis not present

## 2021-03-04 DIAGNOSIS — H8301 Labyrinthitis, right ear: Secondary | ICD-10-CM | POA: Diagnosis not present

## 2021-03-04 DIAGNOSIS — R42 Dizziness and giddiness: Secondary | ICD-10-CM | POA: Diagnosis not present

## 2021-03-04 MED ORDER — METHYLPREDNISOLONE 4 MG PO TBPK
ORAL_TABLET | ORAL | 1 refills | Status: DC
  Filled 2021-03-04: qty 21, 6d supply, fill #0

## 2021-03-04 NOTE — Patient Instructions (Signed)
Medrol dosepak OTC Flonase or other nasal steroidal spray daily for at least 4 weeks Netti Pot with distilled water daily Decongestant ZyrtecD for example for several weeks Consider MRI brain Vestibular rehab for Epley Maneuver teaching but I don;t think this is crystals dislodged(IE BPPV)  Eustachian Tube Dysfunction The ear is divided into three parts: the external ear includes the visible part of the ear (the pinna) and the ear canal; the middle ear is the air-filled space behind the eardrum that contains the three middle ear bones (the ossicles); and the inner ear contains the sensory organs of hearing (cochlea) and balance (semicircular canals).  The Eustachian tube is a narrow tube that connects the middle ear to the back of the nose. Normally, the Eustachian tube opens with every swallow or yawn to act as a pressure-equalizing valve for the middle ear. It also serves to drain the mucus produced by the lining of the middle ear.  Blockage of the Eustachian tube isolates the middle ear space from the outside environment. The lining of the middle ear absorbs the trapped air and creates a negative pressure that pulls the eardrum inward. The eardrum is thin and pliable, like plastic wrap, and is densely innervated. When it becomes stretched inward, patients often experience pain, pressure, dizziness, and hearing loss. Long-term blockage of the Eustachian tube leads to the accumulation of fluid in the middle ear space that further increases the pressure and hearing loss. This is called serous otitis media. Should bacteria contaminate this fluid, a middle ear infection may result, called acute otitis media.  Chronic blockage of the Eustachian tube is called Eustachian tube dysfunction. This can occur when the lining of the nose becomes irritated and inflamed, narrowing the Eustachian tube opening or its passageway. Illnesses like the common cold or influenza are often to blame. Pollution and cigarette  smoke can also cause Eustachian tube dysfunction. In many areas of the country, nasal allergy (allergic rhinitis) is the major cause of Eustachian tube dysfunction. For reasons which are unclear, the incidence of allergies is increasing in the [place]. Obesity can also predispose a patient to Eustachian tube dysfunction because of excess fatty deposits around the passageway of the Eustachian tube. Rarely, Eustachian tube blockage may be the sign of a more serious problem such as nasal polyps, a cleft palate, or a skull base tumor.  Young children (especially ages 73 to 6 years) are at particular risk for Eustachian tube dysfunction, serous otitis media, and acute otitis media because they have very narrow Eustachian tubes. Also, they may have adenoid enlargement that can block the opening of the Eustachian tube. Since children in daycare are highly prone to getting upper respiratory tract infections, they tend to get more ear infections compared to children that are cared for at home.  Interestingly, the anatomy of the Eustachian tube in infants and young children is different than in adults. It runs horizontally, rather than sloping downward from the middle ear. Thus, bottle-feeding should be performed with the infants head elevated, in order to reduce the risk of milk entering the middle ear space. The horizontal course of the Eustachian tube also permits easy transfer of bacteria from the nose to the middle ear space. This is another reason that children are so prone to middle ear infections. Most children older than 6 years have outgrown this problem and their frequency of ear infections should drop substantially.  the ear  Medical Treatment 1) Allergic Treatment and Nasal Decongestion: Identification and treatment of  nasal allergies may also help to reduce the swelling in the lining of the Eustachian tube. Identifying the particular allergen a patient is sensitive to and eliminating it from the  environment may reduce the patient's symptoms. Allergy shots may also provide some help, although it may take a long time to notice beneficial effects. Intranasal steroids act to reduce inflammation of the mucosal lining of the nose and may provide some benefit to patients with Eustachian tube dysfunction. In my experience, this helps about 50% of patients with Eustachian tube dysfunction secondary to allergies. A trial of 2 weeks of daily usage is recommended to see if the medication is helpful. Nasal steroids take this long to begin to work. The onset of their effects is gradual, so patients have to use it every day. One does not typically notice an immediate improvement after spraying it in their nose.  Decongestants constrict blood vessels and help open the Eustachian tube by reducing swelling of the lining of the nose. These medications work immediately and can be taken as needed. Oral preparations work for about 4 hours and should not be used around bedtime because they may make it difficult to get to sleep. Nasal spray preparations work quite well and directly decongest the nose; however, because the body rapidly gets used to the medication, they should only be used for up to three days in a row. Antihistamines work to reduce the bodys inflammatory response to allergens. These medications may be helpful for some patients, although in my experience, not as reliably as nasal steroids or decongestants. Antihistamines can be taken as needed.  2) Self-Inflation of the Ears: It is possible to forcibly blow air through the Eustachian tube into the middle ear by pinching the nose closed and popping the ear. Another way to do this is to blow up balloons. The pressure required to expand a balloon is usually enough to push air up the Eustachian tube. This is a very useful maneuver and may be repeated as often as necessary, whenever a sense of pressure or fullness in the ear develops. This should not be performed  when a cold or nasal discharge is present as this may drive infected mucous into the middle ear and cause an ear infection.  Surgical Treatment The primary goal of surgical treatment is to bypass the Eustachian tube and re-establish ventilation of the middle ear. This will restore hearing, relieve pressure sensation in the ear, and reduce the tendency for middle ear infections.  Myringotomy A tiny incision can be made in the eardrum and any fluid within the middle ear suctioned out. In adults, the incision often stays open long enough to allow the swelling in the Eustachian tube lining to resolve. After the eardrum heals (usually within 1 to 3 days), the middle ear fluid may re-accumulate if the Eustachian tube lining has not recovered.  Pressure Equalization Tubes After making an incision in the eardrum and suctioning out any middle ear fluid, a tiny hollow tube made of plastic or metal is inserted into the eardrum. Over time, the tube is pushed out as the eardrum heals. A pressure equalization tube usually provides middle ear ventilation for 6 to 12 months. Often, the Eustachian tube will have recovered by this time and the pressure equalization tubes need not be replaced. However, longer lasting tubes are available for more chronic problems. In adults, the procedure takes about 5 minutes and can be performed in the office using a topical anesthetic. In children, a light general anesthetic  is needed.  The major disadvantage of pressure equalization tubes is that water must be kept out of the ear. This requires using earplugs or a cotton ball smothered in petroleum jelly while bathing. Swimming usually requires custom earplugs fit individually to the patients ear. Water that gets into the ear canal can carry bacteria through the tube into the middle ear space and cause an ear infection. This is noted as a purulent drainage (white, green, or yellow pus) from the ear. This type of ear infection can be  easily treated with antibiotic eardrops.  The other risk of either a myringotomy or a pressure equalization tube is that the incision may not heal. This may eventually require surgery (tympanoplasty) to patch the hole.  Labyrinthitis Labyrinthitis is an infection of the inner ear. Your inner ear is made up of tubes and canals (labyrinth). These are filled with fluid. There are nerve cells in your inner ear that send hearing and balance signals to your brain. When germs get inside the tubes and canals, they harm the nerve cells that send signals to the brain. This condition often starts all of a sudden and goes away in a few weeks with treatment. If the infection harms parts of the tubes and canals, some symptoms may last for a long time. What are the causes? This condition can be caused by viruses, such as one that causes: Mononucleosis, also called mono. Measles or mumps. The flu. Herpes. This condition can also be caused by bacteria that spread from an infection in the brain or the middle ear. What increases the risk? You may be at greater risk for this condition if: You had a mouth, nose, throat, or ear infection not long ago. You drink a lot of alcohol. You smoke. You use certain drugs. You are feeling tired (fatigued). You have a lot of stress. You have allergies. What are the signs or symptoms? Symptoms of this condition often start all of a sudden. The symptoms may range from mild to very bad, and may include: Dizziness. Hearing loss. A feeling that you or the things around you are moving when they are not (vertigo). Ringing in your ear (tinnitus). A feeling like you may vomit (nausea). Vomiting. Trouble focusing your eyes. If you have symptoms that last a long time, they may include: Feeling tired. Confusion. Hearing loss. Ringing in your ear. Poor balance. A feeling that you or the things around you are moving when they are not if you move your head suddenly. How is  this treated? Treatment depends on the cause. If your condition is caused by bacteria, you may need antibiotic medicine. If it is caused by a virus, it may get better on its own. No matter the cause, you may be treated with: Medicines to: Stop dizziness. Stop the feeling that you may vomit. Reduce irritation and swelling. Get better faster. Fluids through an IV tube. These may be given at a hospital. You may need IV fluids if you have very bad vomiting or feelings like you may vomit. Physical therapy. A therapist can teach you exercises to help you get used to feeling dizzy. You may need this if you have dizziness that does not go away. Follow these instructions at home: Medicines Take over-the-counter and prescription medicines only as told by your doctor. If you were prescribed an antibiotic medicine, take it as told by your doctor. Do not stop taking it even if you start to feel better. Activity Rest as told by your doctor.  Limit the things you do as told. Return to your normal activities when your doctor says that it is safe. Do not make any sudden movements until you no longer feel dizzy. Do exercises as told by your doctor. General instructions Avoid loud noises and bright lights. Do not drive until your doctor says that it is safe for you. Drink enough fluid to keep your pee (urine) pale yellow. Keep all follow-up visits. Contact a doctor if: Your symptoms do not get better with medicine. You do not get better after 2 weeks. You have a fever. Get help right away if: You keep feeling like you may vomit. You keep vomiting. You are very dizzy. Your hearing gets much worse all of a sudden. Summary Labyrinthitis is an infection of the inner ear. This condition is often caused by a virus. Symptoms include dizziness, hearing loss, and ringing in the ears. Treatment depends on the cause. Do what your doctor tells you. This information is not intended to replace advice given to you  by your health care provider. Make sure you discuss any questions you have with your health care provider. Document Revised: 04/01/2020 Document Reviewed: 04/01/2020 Elsevier Patient Education  2022 [employer].  Benign Positional Vertigo Vertigo is the feeling that you or your surroundings are moving when they are not. Benign positional vertigo is the most common form of vertigo. This is usually a harmless condition (benign). This condition is positional. This means that symptoms are triggered by certain movements and positions. This condition can be dangerous if it occurs while you are doing something that could cause harm to yourself or others. This includes activities such as driving or operating machinery. What are the causes? The inner ear has fluid-filled canals that help your brain sense movement and balance. When the fluid moves, the brain receives messages about your body's position. With benign positional vertigo, calcium crystals in the inner ear break free and disturb the inner ear area. This causes your brain to receive confusing messages about your body's position. What increases the risk? You are more likely to develop this condition if: You are a woman. You are 67 years of age or older. You have recently had a head injury. You have an inner ear disease. What are the signs or symptoms? Symptoms of this condition usually happen when you move your head or your eyes in different directions. Symptoms may start suddenly and usually last for less than a minute. They include: Loss of balance and falling. Feeling like you are spinning or moving. Feeling like your surroundings are spinning or moving. Nausea and vomiting. Blurred vision. Dizziness. Involuntary eye movement (nystagmus). Symptoms can be mild and cause only minor problems, or they can be severe and interfere with daily life. Episodes of benign positional vertigo may return (recur) over time. Symptoms may also improve  over time. How is this diagnosed? This condition may be diagnosed based on: Your medical history. A physical exam of the head, neck, and ears. Positional tests to check for or stimulate vertigo. You may be asked to turn your head and change positions, such as going from sitting to lying down. A health care provider will watch for symptoms of vertigo. You may be referred to a health care provider who specializes in ear, nose, and throat problems (ENT or otolaryngologist) or a provider who specializes in disorders of the nervous system (neurologist). How is this treated? This condition may be treated in a session in which your health care provider moves  your head in specific positions to help the displaced crystals in your inner ear move. Treatment for this condition may take several sessions. Surgery may be needed in severe cases, but this is rare. In some cases, benign positional vertigo may resolve on its own in 2-4 weeks. Follow these instructions at home: Safety Move slowly. Avoid sudden body or head movements or certain positions, as told by your health care provider. Avoid driving or operating machinery until your health care provider says it is safe. Avoid doing any tasks that would be dangerous to you or others if vertigo occurs. If you have trouble walking or keeping your balance, try using a cane for stability. If you feel dizzy or unstable, sit down right away. Return to your normal activities as told by your health care provider. Ask your health care provider what activities are safe for you. General instructions Take over-the-counter and prescription medicines only as told by your health care provider. Drink enough fluid to keep your urine pale yellow. Keep all follow-up visits. This is important. Contact a health care provider if: You have a fever. Your condition gets worse or you develop new symptoms. Your family or friends notice any behavioral changes. You have nausea or  vomiting that gets worse. You have numbness or a prickling and tingling sensation. Get help right away if you: Have difficulty speaking or moving. Are always dizzy or faint. Develop severe headaches. Have weakness in your legs or arms. Have changes in your hearing or vision. Develop a stiff neck. Develop sensitivity to light. These symptoms may represent a serious problem that is an emergency. Do not wait to see if the symptoms will go away. Get medical help right away. Call your local emergency services (911 in the [place] Do not drive yourself to the hospital. Summary Vertigo is the feeling that you or your surroundings are moving when they are not. Benign positional vertigo is the most common form of vertigo. This condition is caused by calcium crystals in the inner ear that become displaced. This causes a disturbance in an area of the inner ear that helps your brain sense movement and balance. Symptoms include loss of balance and falling, feeling that you or your surroundings are moving, nausea and vomiting, and blurred vision. This condition can be diagnosed based on symptoms, a physical exam, and positional tests. Follow safety instructions as told by your health care provider and keep all follow-up visits. This is important. This information is not intended to replace advice given to you by your health care provider. Make sure you discuss any questions you have with your health care provider. Document Revised: 01/22/2020 Document Reviewed: 01/22/2020 Elsevier Patient Education  2022 [employer].

## 2021-03-04 NOTE — Progress Notes (Signed)
[id number]    Provider:  Dr Jaynee Eagles Requesting Provider: [name], MD Primary Care Provider:  Elby Showers, MD  CC:  dizziness  HPI:  Carla Gonzalez is a 35 y.o. female here as requested by Elby Showers, MD for vertigo. PMHx fragile X chromosome, dilated pulmonary artery, symptomatic bradycardia.  I reviewed Dr. 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  She has rightward nystagmus probably BPPV which may be aggravated by stress, comes on without warning and alarming to her makes her feel a bit out of control, she has a stressful job, she does not have tinnitus with these episodes, they also gave her Antivert.   She had her youngest daughter in June, she had a lot of problems with bradycardia and flushing and palpitations, a few months later she would get dizzy standing in the kitchen, she was diagnosed with hypothyroidism, dizziness was better on synthroid, it was bothering her at work, she felt imbalanced, the room would feel like it would get narrow and dark, 2 months ago her dizziness came back, just the dizziness, dizziness if she turns her head too much or even with sitting, meclizine gave her side effects, she feels like she is on a ride, she feels she is moving but the room is not, movement when there isn't. A few episodes she felt lightheaded and her vision narrowed. Her kids are sick constantly. She has been on antibitotics. She went to the eye doctor. No headaches. Decreased hearing on the right side like in a tunnel. No other focal neurologic deficits, associated  symptoms, inciting events or modifiable factors.    Reviewed notes, labs and imaging from outside physicians, which showed: see above  Review of Systems: Patient complains of symptoms per HPI as well as the following symptoms dizziness. Pertinent negatives and positives per HPI. All others negative.   Social History   Socioeconomic History   Marital status: Married    Spouse name: Not on file   Number of children: Not on file   Years of education: Not on file   Highest education level: Not on file  Occupational History   Not on file  Tobacco Use   Smoking status: Never   Smokeless tobacco: Never  Vaping Use   Vaping Use: Never used  Substance and Sexual Activity   Alcohol use: Not Currently   Drug use: Never   Sexual activity: Yes    Birth control/protection: None  Other Topics Concern   Not on file  Social History Narrative   Works as cardiology NP.  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    Social Determinants of Health   Financial Resource Strain: Not on file  Food Insecurity: Not on file  Transportation Needs: Not on file  Physical Activity: Not on file  Stress: Not on file  Social Connections: Not on file  Intimate Partner Violence: Not on file    Family History  Problem Relation Age of Onset   Hypertension Father    Melanoma Father    Migraines Neg Hx     Past Medical History:  Diagnosis Date   Medical history non-contributory     Patient  Active Problem List   Diagnosis Date Noted   Symptomatic bradycardia 08/18/2019   Dilated pulmonary artery 08/18/2019   Cesarean delivery due to previous difficult delivery, delivered, current hospitalization 08/12/2019   Fetal malpresentation, delivered, current hospitalization 08/12/2019   Fragile x chromosome 04/04/2016    Past Surgical History:  Procedure Laterality Date   APPENDECTOMY     CESAREAN SECTION N/A 08/12/2019   Procedure: CESAREAN SECTION;  Surgeon: Jerelyn Charles, MD;  Location: [facility] LD ORS;  Service: Obstetrics;   Laterality: N/A;   Grommet     TONSILLECTOMY AND ADENOIDECTOMY  2009    Current Outpatient Medications  Medication Sig Dispense Refill   levothyroxine (SYNTHROID) 75 MCG tablet Take 1 tablet (75 mcg total) by mouth daily. 90 tablet 0   meclizine (ANTIVERT) 25 MG tablet Take 1 tablet (25 mg total) by mouth 3 (three) times daily as needed for dizziness. 30 tablet 0   methylPREDNISolone (MEDROL DOSEPAK) 4 MG TBPK tablet Take pills daily all together with food. Take the first dose (6 pills) as soon as possible. Take the rest each morning. Decrease by 1 tablet every morning (6-5-4-3-2-1) 21 tablet 1   norethindrone-ethinyl estradiol-FE (LOESTRIN FE 1/20) 1-20 MG-MCG tablet Take 1 tablet by mouth daily. 84 tablet 4   No current facility-administered medications for this visit.    Allergies as of 03/04/2021 - Review Complete 03/04/2021  Allergen Reaction Noted   Sulfa antibiotics Hives 09/01/2011    Vitals: BP 125/86    Pulse 72    Ht 5\' 7"  (1.702 m)    Wt 205 lb (93 kg)    BMI 32.11 kg/m  Last Weight:  Wt Readings from Last 1 Encounters:  03/04/21 205 lb (93 kg)   Last Height:   Ht Readings from Last 1 Encounters:  03/04/21 5\' 7"  (1.702 m)     Physical exam: Exam: Gen: NAD, conversant, well nourised, obese, well groomed                     CV: RRR, no MRG. No Carotid Bruits. No peripheral edema, warm, nontender Eyes: Conjunctivae clear without exudates or hemorrhage Ears: fluid behind right ear  Neuro: Detailed Neurologic Exam  Speech:    Speech is normal; fluent and spontaneous with normal comprehension.  Cognition:    The patient is oriented to person, place, and time;     recent and remote memory intact;     language fluent;     normal attention, concentration,     fund of knowledge Cranial Nerves:    The pupils are equal, round, and reactive to light. The fundi are normal and spontaneous venous pulsations are present. Visual fields are full to finger confrontation.  Extraocular movements are intact. Trigeminal sensation is intact and the muscles of mastication are normal. The face is symmetric. The palate elevates in the midline. Hearing intact. Voice is normal. Shoulder shrug is normal. The tongue has normal motion without fasciculations.   Coordination:    Normal   Gait:     normal.   Motor Observation:    No asymmetry, no atrophy, and no involuntary movements noted. Tone:    Normal muscle tone.    Posture:    Posture is normal. normal erect    Strength:    Strength is V/V in the upper and lower limbs.      Sensation: intact to LT     Reflex Exam:  DTR's:    Deep tendon reflexes in the upper  and lower extremities are normal bilaterally.   Toes:    The toes are downgoing bilaterally.   Clonus:    Clonus is absent.    Assessment/Plan:  Lovely 35 year old patient with dizziness. Her right ear appears to have fluid, TM does not look pearly. She has kids, they have been sick and she has had upper respiratory infections and been on antibiotics. I think this is inner ear issue/inflammation or eustacian tube dysfunction but also have to consider BPPV or other etiologies.  Medrol dosepak :Inner ear inflammation/fluid behind right ear: Kids have bee sick, maybe viral. She has been on antibiotics. She reports pressure behind ear. Try medrol dosepak OTC Flonase or other nasal steroidal spray daily for at least 4 weeks: may help with inner ear fluid and inflammation along with the medrol dosepak Netti Pot with distilled water daily: watched online video can help with congestion and sinus issues Decongestant ZyrtecD for example for several weeks Consider MRI brain if no resolution with above Vestibular rehab for Epley Maneuver teaching but I don;t think this is crystals dislodged(IE BPPV), will order in case above does not resolve Consider ENT/Allergy. She has some fluid behind the ears. Consider ear tubes? She had them as a child, may consider as  last resort  Orders Placed This Encounter  Procedures   Ambulatory referral to Physical Therapy   Meds ordered this encounter  Medications   methylPREDNISolone (MEDROL DOSEPAK) 4 MG TBPK tablet    Sig: Take pills daily all together with food. Take the first dose (6 pills) as soon as possible. Take the rest each morning. Decrease by 1 tablet every morning (6-5-4-3-2-1)    Dispense:  21 tablet    Refill:  1    Cc: Baxley, Cresenciano Lick, MD,  Baxley, Cresenciano Lick, MD  Sarina Ill, MD  [facility] [address] [place], [place] [address]  Phone [phone] Fax [phone]  I spent over 60 minutes of face-to-face and non-face-to-face time with patient on the  1. Dizziness   2. Vertigo   3. Fluid level behind tympanic membrane of right ear   4. Labyrinthitis of right ear    diagnosis.  This included previsit chart review, lab review, study review, order entry, electronic health record documentation, patient education on the different diagnostic and therapeutic options, counseling and coordination of care, risks and benefits of management, compliance, or risk factor reduction

## 2021-03-07 DIAGNOSIS — N281 Cyst of kidney, acquired: Secondary | ICD-10-CM

## 2021-03-07 HISTORY — DX: Cyst of kidney, acquired: N28.1

## 2021-03-09 ENCOUNTER — Encounter: Payer: Self-pay | Admitting: Neurology

## 2021-03-26 ENCOUNTER — Other Ambulatory Visit (HOSPITAL_COMMUNITY): Payer: Self-pay

## 2021-03-29 ENCOUNTER — Other Ambulatory Visit (HOSPITAL_COMMUNITY): Payer: Self-pay

## 2021-04-26 ENCOUNTER — Encounter (HOSPITAL_BASED_OUTPATIENT_CLINIC_OR_DEPARTMENT_OTHER): Payer: Self-pay

## 2021-04-26 ENCOUNTER — Emergency Department (HOSPITAL_BASED_OUTPATIENT_CLINIC_OR_DEPARTMENT_OTHER): Payer: No Typology Code available for payment source

## 2021-04-26 ENCOUNTER — Other Ambulatory Visit: Payer: Self-pay

## 2021-04-26 ENCOUNTER — Other Ambulatory Visit (HOSPITAL_COMMUNITY): Payer: Self-pay

## 2021-04-26 ENCOUNTER — Emergency Department (HOSPITAL_BASED_OUTPATIENT_CLINIC_OR_DEPARTMENT_OTHER)
Admission: EM | Admit: 2021-04-26 | Discharge: 2021-04-26 | Disposition: A | Discharge status: Home / Self Care | Payer: No Typology Code available for payment source | Attending: Emergency Medicine | Admitting: Emergency Medicine

## 2021-04-26 DIAGNOSIS — D72829 Elevated white blood cell count, unspecified: Secondary | ICD-10-CM | POA: Insufficient documentation

## 2021-04-26 DIAGNOSIS — R1013 Epigastric pain: Secondary | ICD-10-CM | POA: Diagnosis present

## 2021-04-26 DIAGNOSIS — R1011 Right upper quadrant pain: Secondary | ICD-10-CM

## 2021-04-26 DIAGNOSIS — N281 Cyst of kidney, acquired: Secondary | ICD-10-CM | POA: Diagnosis not present

## 2021-04-26 DIAGNOSIS — R1110 Vomiting, unspecified: Secondary | ICD-10-CM | POA: Insufficient documentation

## 2021-04-26 LAB — URINALYSIS, ROUTINE W REFLEX MICROSCOPIC
Bilirubin Urine: NEGATIVE
Glucose, UA: NEGATIVE mg/dL
Hgb urine dipstick: NEGATIVE
Ketones, ur: NEGATIVE mg/dL
Leukocytes,Ua: NEGATIVE
Nitrite: NEGATIVE
Protein, ur: NEGATIVE mg/dL
Specific Gravity, Urine: 1.015 (ref 1.005–1.030)
pH: 7.5 (ref 5.0–8.0)

## 2021-04-26 LAB — CBC
HCT: 43.2 % (ref 36.0–46.0)
Hemoglobin: 14.4 g/dL (ref 12.0–15.0)
MCH: 29.1 pg (ref 26.0–34.0)
MCHC: 33.3 g/dL (ref 30.0–36.0)
MCV: 87.4 fL (ref 80.0–100.0)
Platelets: 279 10*3/uL (ref 150–400)
RBC: 4.94 MIL/uL (ref 3.87–5.11)
RDW: 13.5 % (ref 11.5–15.5)
WBC: 16.5 10*3/uL — ABNORMAL HIGH (ref 4.0–10.5)
nRBC: 0 % (ref 0.0–0.2)

## 2021-04-26 LAB — COMPREHENSIVE METABOLIC PANEL
ALT: 9 U/L (ref 0–44)
AST: 12 U/L — ABNORMAL LOW (ref 15–41)
Albumin: 4.6 g/dL (ref 3.5–5.0)
Alkaline Phosphatase: 59 U/L (ref 38–126)
Anion gap: 11 (ref 5–15)
BUN: 16 mg/dL (ref 6–20)
CO2: 25 mmol/L (ref 22–32)
Calcium: 9.2 mg/dL (ref 8.9–10.3)
Chloride: 103 mmol/L (ref 98–111)
Creatinine, Ser: 0.74 mg/dL (ref 0.44–1.00)
GFR, Estimated: >60 mL/min (ref >60)
Glucose, Bld: 99 mg/dL (ref 70–99)
Potassium: 3.9 mmol/L (ref 3.5–5.1)
Sodium: 139 mmol/L (ref 135–145)
Total Bilirubin: 0.7 mg/dL (ref 0.3–1.2)
Total Protein: 7.1 g/dL (ref 6.5–8.1)

## 2021-04-26 LAB — HCG, SERUM, QUALITATIVE: Preg, Serum: NEGATIVE

## 2021-04-26 LAB — LIPASE, BLOOD: Lipase: 22 U/L (ref 11–51)

## 2021-04-26 MED ORDER — SUCRALFATE 1 G PO TABS
1.0000 g | ORAL_TABLET | Freq: Three times a day (TID) | ORAL | 0 refills | Status: DC
  Filled 2021-04-26: qty 24, 6d supply, fill #0
  Filled 2021-04-26: qty 32, 8d supply, fill #0

## 2021-04-26 MED ORDER — MORPHINE SULFATE (PF) 4 MG/ML IV SOLN
4.0000 mg | Freq: Once | INTRAVENOUS | Status: DC
  Filled 2021-04-26: qty 1

## 2021-04-26 MED ORDER — MORPHINE SULFATE (PF) 2 MG/ML IV SOLN
2.0000 mg | Freq: Once | INTRAVENOUS | Status: AC
  Administered 2021-04-26: 2 mg via INTRAVENOUS
  Filled 2021-04-26: qty 1

## 2021-04-26 MED ORDER — OXYCODONE HCL 5 MG PO TABS
5.0000 mg | ORAL_TABLET | ORAL | 0 refills | Status: DC | PRN
  Filled 2021-04-26: qty 15, 3d supply, fill #0

## 2021-04-26 MED ORDER — IOHEXOL 300 MG/ML  SOLN
100.0000 mL | Freq: Once | INTRAMUSCULAR | Status: AC | PRN
  Administered 2021-04-26: 85 mL via INTRAVENOUS

## 2021-04-26 MED ORDER — ONDANSETRON HCL 4 MG/2ML IJ SOLN
4.0000 mg | Freq: Once | INTRAMUSCULAR | Status: AC
  Administered 2021-04-26: 4 mg via INTRAVENOUS
  Filled 2021-04-26: qty 2

## 2021-04-26 MED ORDER — SODIUM CHLORIDE 0.9 % IV BOLUS
1000.0000 mL | Freq: Once | INTRAVENOUS | Status: AC
  Administered 2021-04-26: 1000 mL via INTRAVENOUS

## 2021-04-26 MED ORDER — PANTOPRAZOLE SODIUM 20 MG PO TBEC
20.0000 mg | DELAYED_RELEASE_TABLET | Freq: Every day | ORAL | 0 refills | Status: DC
  Filled 2021-04-26: qty 30, 30d supply, fill #0

## 2021-04-26 MED ORDER — ONDANSETRON 4 MG PO TBDP
4.0000 mg | ORAL_TABLET | Freq: Three times a day (TID) | ORAL | 0 refills | Status: DC | PRN
  Filled 2021-04-26: qty 20, 7d supply, fill #0

## 2021-04-26 NOTE — Discharge Instructions (Addendum)
It was a pleasure taking care of you here in the emergency department today  Your labs and imaging are reassuring  Your ultrasound did not show any evidence of infection in your gallbladder or stones  Your CT scan did show a cyst in your left kidney  I have written you for a few medications to help with your symptoms  Follow-up with your primary care provider gastroenterology  If you have any new or worsening symptoms please seek reevaluation the emergency department

## 2021-04-26 NOTE — ED Provider Notes (Signed)
[facility] EMERGENCY DEPT Provider Note   CSN: [id number] Arrival date & time: 04/26/21  1153     History  Chief Complaint  Patient presents with   Abdominal Pain    Carla Gonzalez is a 36 y.o. female with no significant past medical history here for evaluation of abdominal pain.  Had episode of epigastric abdominal pain this weekend after eating.  Felt improved after having BM.  Woke up this morning with epigastric abdominal pain radiating to right upper abdomen.  Felt nauseous.  Went home from work and had episode of NBNB emesis.  No known history of gallstones.  Does have prior history of appendectomy, cesarean section.  Denies fever, chest pain, shortness of breath, dysuria, hematuria.  No chronic NSAID use, EtOH use.  No history of pancreatitis. No bloody stool.  HPI     Home Medications Prior to Admission medications   Medication Sig Start Date End Date Taking? Authorizing Provider  ondansetron (ZOFRAN-ODT) 4 MG disintegrating tablet Take 1 tablet (4 mg total) by mouth every 8 (eight) hours as needed for nausea or vomiting. 04/26/21  Yes Kenzi Bardwell A, PA-C  oxyCODONE (ROXICODONE) 5 MG immediate release tablet Take 1 tablet (5 mg total) by mouth every 4 (four) hours as needed for severe pain. 04/26/21  Yes Kesean Serviss A, PA-C  pantoprazole (PROTONIX) 20 MG tablet Take 1 tablet (20 mg total) by mouth daily. 04/26/21  Yes Rayhan Groleau A, PA-C  sucralfate (CARAFATE) 1 g tablet Take 1 tablet (1 g total) by mouth 4 (four) times daily -  with meals and at bedtime for 14 days. 04/26/21 05/10/21 Yes Koltin Wehmeyer A, PA-C  levothyroxine (SYNTHROID) 75 MCG tablet Take 1 tablet (75 mcg total) by mouth daily. 02/02/21   Elby Showers, MD  meclizine (ANTIVERT) 25 MG tablet Take 1 tablet (25 mg total) by mouth 3 (three) times daily as needed for dizziness. 02/02/21   Elby Showers, MD  methylPREDNISolone (MEDROL DOSEPAK) 4 MG TBPK tablet Take pills daily all  together with food. Take the first dose (6 pills) as soon as possible. Take the rest each morning. Decrease by 1 tablet every morning (6-5-4-3-2-1) 03/04/21   Melvenia Beam, MD  norethindrone-ethinyl estradiol-FE (LOESTRIN FE 1/20) 1-20 MG-MCG tablet Take 1 tablet by mouth daily. 01/18/21         Allergies    Sulfa antibiotics    Review of Systems   Review of Systems  Constitutional: Negative.   HENT: Negative.    Respiratory: Negative.    Cardiovascular: Negative.   Gastrointestinal:  Positive for abdominal pain, nausea and vomiting. Negative for abdominal distention, anal bleeding, blood in stool, constipation, diarrhea and rectal pain.  Genitourinary: Negative.   Musculoskeletal: Negative.   Skin: Negative.   Neurological: Negative.   All other systems reviewed and are negative.  Physical Exam Updated Vital Signs BP 119/69    Pulse 87    Temp 98.4 F (36.9 C) (Oral)    Resp 12    Ht 5\' 7"  (1.702 m)    Wt 93 kg    LMP 04/19/2021 (Approximate)    SpO2 100%    BMI 32.11 kg/m  Physical Exam Vitals and nursing note reviewed.  Constitutional:      General: She is not in acute distress.    Appearance: She is well-developed. She is not ill-appearing, toxic-appearing or diaphoretic.  HENT:     Head: Normocephalic and atraumatic.  Eyes:     Pupils: Pupils  are equal, round, and reactive to light.  Cardiovascular:     Rate and Rhythm: Normal rate.     Heart sounds: Normal heart sounds.  Pulmonary:     Effort: Pulmonary effort is normal. No respiratory distress.     Breath sounds: Normal breath sounds.  Abdominal:     General: Bowel sounds are normal. There is no distension.     Palpations: Abdomen is soft.     Tenderness: There is abdominal tenderness in the right upper quadrant and epigastric area. There is no right CVA tenderness, left CVA tenderness, guarding or rebound.     Hernia: No hernia is present.  Musculoskeletal:        General: Normal range of motion.      Cervical back: Normal range of motion.  Skin:    General: Skin is warm and dry.  Neurological:     General: No focal deficit present.     Mental Status: She is alert.  Psychiatric:        Mood and Affect: Mood normal.    ED Results / Procedures / Treatments   Labs (all labs ordered are listed, but only abnormal results are displayed) Labs Reviewed  COMPREHENSIVE METABOLIC PANEL - Abnormal; Notable for the following components:      Result Value   AST 12 (*)    All other components within normal limits  CBC - Abnormal; Notable for the following components:   WBC 16.5 (*)    All other components within normal limits  LIPASE, BLOOD  URINALYSIS, ROUTINE W REFLEX MICROSCOPIC  HCG, SERUM, QUALITATIVE    EKG None  Radiology CT Abdomen Pelvis W Contrast  Result Date: 04/26/2021 CLINICAL DATA:  Epigastric pain. Pain radiates to the RIGHT shoulder. Nausea, vomiting. Prior appendectomy and C-section. EXAM: CT ABDOMEN AND PELVIS WITH CONTRAST TECHNIQUE: Multidetector CT imaging of the abdomen and pelvis was performed using the standard protocol following bolus administration of intravenous contrast. RADIATION DOSE REDUCTION: This exam was performed according to the departmental dose-optimization program which includes automated exposure control, adjustment of the mA and/or kV according to patient size and/or use of iterative reconstruction technique. CONTRAST:  63mL OMNIPAQUE IOHEXOL 300 MG/ML  SOLN COMPARISON:  Ultrasound of the abdomen on 04/26/2021, CT angio chest 08/18/2019 FINDINGS: Lower chest: No acute abnormality. Pectus excavatum noted, with AP diameter of the chest measuring 7.2 centimeters from sternum to thoracic spine. Hepatobiliary: No focal liver abnormality is seen. No radiopaque gallstones, biliary dilatation, or pericholecystic inflammatory changes. Pancreas: Unremarkable. No pancreatic ductal dilatation or surrounding inflammatory changes. Spleen: Normal in size without focal  abnormality. Adrenals/Urinary Tract: Adrenal glands are normal. RIGHT kidney is unremarkable.  RIGHT ureter course is normal. Cyst in the LOWER pole of the LEFT kidney measures 3.9 centimeters in diameter. There is no hydronephrosis. LEFT ureter is unremarkable. Stomach/Bowel: The stomach and small bowel loops are normal in appearance. Surgical clips in the region of cecum consistent with previous appendectomy. Loops of colon are unremarkable. Vascular/Lymphatic: No significant vascular findings are present. No enlarged abdominal or pelvic lymph nodes. Reproductive: Uterus and bilateral adnexa are unremarkable. Other: No abdominal wall hernia or abnormality. No abdominopelvic ascites. Musculoskeletal: No fracture is seen. IMPRESSION: 1. No acute abnormality of the abdomen pelvis. 2. Pectus excavatum. 3. Benign LEFT renal cyst. 4. Prior appendectomy. Electronically Signed   By: Nolon Nations M.D.   On: 04/26/2021 14:43   US Abdomen Limited RUQ (LIVER/GB)  Result Date: 04/26/2021 CLINICAL DATA:  Pain right upper quadrant  EXAM: ULTRASOUND ABDOMEN LIMITED RIGHT UPPER QUADRANT COMPARISON:  None. FINDINGS: Gallbladder: No gallstones or wall thickening visualized. No sonographic Murphy sign noted by sonographer. Common bile duct: Diameter: 3.7 mm Liver: No focal lesion identified. Within normal limits in parenchymal echogenicity. Portal vein is patent on color Doppler imaging with normal direction of blood flow towards the liver. Other: None. IMPRESSION: No sonographic abnormality is seen in the right upper quadrant of abdomen. Electronically Signed   By: Elmer Picker M.D.   On: 04/26/2021 12:55    Procedures Procedures    Medications Ordered in ED Medications  ondansetron (ZOFRAN) injection 4 mg (4 mg Intravenous Given 04/26/21 1227)  sodium chloride 0.9 % bolus 1,000 mL (0 mLs Intravenous Stopped 04/26/21 1504)  morphine (PF) 2 MG/ML injection 2 mg (2 mg Intravenous Given 04/26/21 1250)  iohexol  (OMNIPAQUE) 300 MG/ML solution 100 mL (85 mLs Intravenous Contrast Given 04/26/21 1430)    ED Course/ Medical Decision Making/ A&P    36 year old here for evaluation of epigastric and right upper quadrant abdominal pain as well as NBNB emesis.  She is afebrile, nonseptic, not ill-appearing.  She denies any melena, bright blood per rectum, chronic NSAID use, chronic EtOH use.  No history of pancreatitis, known history of gallstones.  Abdomen diffusely tender to epigastric region all as well as right upper quadrant however no grossly positive Murphy sign.  We will plan on labs, imaging and reassess  Labs and imaging personally viewed and interpreted: Pregnancy test negative CBC leukocytosis 16.5 Lipase 22 Metabolic panel without electrolyte, renal or liver abnormality UA negative for infection Ultrasound negative for cholecystitis, cholelithiasis CT scan negative for acute abnormality does show left renal cyst  Patient reassessed.  Still has some pain however declines further need for pain medication here in the emergency department.  Unclear etiology of patient's leukocytosis, question reactive, no evidence of infectious process on exam.  Question possible PUD versus possible need for HIDA scan to rule out gallbladder sludge as cause of her pain.  Will refer to GI.  Will treat symptomatically in the meantime.  I discussed results with patient and significant other.  We will have her return for any new or worsening symptoms.  Patient does not meet the SIRS or Sepsis criteria.  On repeat exam patient does not have a surgical abdomin and there are no peritoneal signs.  No indication of appendicitis, bowel obstruction, bowel perforation, cholecystitis, diverticulitis, PID, torsion, ectopic pregnancy, atypical etiology of abd pain such as AAA, dissection, PNA, PE.    The patient has been appropriately medically screened and/or stabilized in the ED. I have low suspicion for any other emergent medical  condition which would require further screening, evaluation or treatment in the ED or require inpatient management.  Patient is hemodynamically stable and in no acute distress.  Patient able to ambulate in department prior to ED.  Evaluation does not show acute pathology that would require ongoing or additional emergent interventions while in the emergency department or further inpatient treatment.  I have discussed the diagnosis with the patient and answered all questions.  Pain is been managed while in the emergency department and patient has no further complaints prior to discharge.  Patient is comfortable with plan discussed in room and is stable for discharge at this time.  I have discussed strict return precautions for returning to the emergency department.  Patient was encouraged to follow-up with PCP/specialist refer to at discharge.  Medical Decision Making Amount and/or Complexity of Data Reviewed Independent Historian: spouse External Data Reviewed: labs and notes. Labs: ordered. Decision-making details documented in ED Course. Radiology: ordered and independent interpretation performed. Decision-making details documented in ED Course.  Risk OTC drugs. Prescription drug management.          Final Clinical Impression(s) / ED Diagnoses Final diagnoses:  Epigastric pain  RUQ abdominal pain    Rx / DC Orders ED Discharge Orders          Ordered    Ambulatory referral to Gastroenterology        04/26/21 1535    sucralfate (CARAFATE) 1 g tablet  3 times daily with meals & bedtime        04/26/21 1536    pantoprazole (PROTONIX) 20 MG tablet  Daily        04/26/21 1536    oxyCODONE (ROXICODONE) 5 MG immediate release tablet  Every 4 hours PRN        04/26/21 1536    ondansetron (ZOFRAN-ODT) 4 MG disintegrating tablet  Every 8 hours PRN        04/26/21 1536              Veleka Djordjevic A, PA-C 04/26/21 [place], [place],  DO 04/27/21 2979

## 2021-04-26 NOTE — ED Notes (Signed)
Pt provided water for fluid challenge. Instructed to press call button if she feels nauseous or starts vomiting.

## 2021-04-26 NOTE — ED Triage Notes (Signed)
Onset this am right side abd pain radiating into right shoulder.  Nausea and vomiting.

## 2021-04-27 ENCOUNTER — Encounter: Payer: Self-pay | Admitting: *Deleted

## 2021-04-29 ENCOUNTER — Ambulatory Visit: Payer: No Typology Code available for payment source | Admitting: Internal Medicine

## 2021-04-29 ENCOUNTER — Encounter: Payer: Self-pay | Admitting: Internal Medicine

## 2021-04-29 ENCOUNTER — Other Ambulatory Visit: Payer: No Typology Code available for payment source

## 2021-04-29 ENCOUNTER — Other Ambulatory Visit (HOSPITAL_COMMUNITY): Payer: Self-pay

## 2021-04-29 VITALS — BP 110/70 | HR 77 | Ht 67.0 in | Wt 199.1 lb

## 2021-04-29 DIAGNOSIS — R195 Other fecal abnormalities: Secondary | ICD-10-CM

## 2021-04-29 DIAGNOSIS — R1013 Epigastric pain: Secondary | ICD-10-CM

## 2021-04-29 DIAGNOSIS — R112 Nausea with vomiting, unspecified: Secondary | ICD-10-CM

## 2021-04-29 DIAGNOSIS — R630 Anorexia: Secondary | ICD-10-CM

## 2021-04-29 DIAGNOSIS — R110 Nausea: Secondary | ICD-10-CM

## 2021-04-29 MED ORDER — PANTOPRAZOLE SODIUM 20 MG PO TBEC
40.0000 mg | DELAYED_RELEASE_TABLET | Freq: Every day | ORAL | 2 refills | Status: DC
  Filled 2021-04-29: qty 60, 30d supply, fill #0

## 2021-04-29 MED ORDER — DICYCLOMINE HCL 20 MG PO TABS
20.0000 mg | ORAL_TABLET | Freq: Three times a day (TID) | ORAL | 1 refills | Status: DC | PRN
  Filled 2021-04-29: qty 90, 30d supply, fill #0

## 2021-04-29 NOTE — Patient Instructions (Addendum)
If you are age 34 or older, your body mass index should be between 23-30. Your Body mass index is 31.19 kg/m. If this is out of the aforementioned range listed, please consider follow up with your Primary Care Provider.  If you are age 29 or younger, your body mass index should be between 19-25. Your Body mass index is 31.19 kg/m. If this is out of the aformentioned range listed, please consider follow up with your Primary Care Provider.   ________________________________________________________  The [place] [facility] providers would like to encourage you to use [facility] to communicate with providers for non-urgent requests or questions.  Due to long hold times on the telephone, sending your provider a message by [facility] may be a faster and more efficient way to get a response.  Please allow 48 business hours for a response.  Please remember that this is for non-urgent requests.  _______________________________________________________  Please go to the lab in the basement of our building to have lab work done as you leave today. Hit "B" for basement when you get on the elevator.  When the doors open the lab is on your left.  We will call you with the results. Thank you.   You have been scheduled for a HIDA scan at [facility] (1st floor) on Thursday, 05-06-21 at 7:00 am. Please arrive 30 minutes prior to your scheduled appointment at  7:09 am. Make certain not to have anything to eat or drink at least 6 hours prior to your test. Should this appointment date or time not work well for you, please call radiology scheduling at [phone].  _____________________________________________________________________ hepatobiliary (HIDA) scan is an imaging procedure used to diagnose problems in the liver, gallbladder and bile ducts. In the HIDA scan, a radioactive chemical or tracer is injected into a vein in your arm. The tracer is handled by the liver like bile. Bile is a fluid produced and excreted by your  liver that helps your digestive system break down fats in the foods you eat. Bile is stored in your gallbladder and the gallbladder releases the bile when you eat a meal. A special nuclear medicine scanner (gamma camera) tracks the flow of the tracer from your liver into your gallbladder and small intestine.  During your HIDA scan  You'll be asked to change into a hospital gown before your HIDA scan begins. Your health care team will position you on a table, usually on your back. The radioactive tracer is then injected into a vein in your arm.The tracer travels through your bloodstream to your liver, where it's taken up by the bile-producing cells. The radioactive tracer travels with the bile from your liver into your gallbladder and through your bile ducts to your small intestine.You may feel some pressure while the radioactive tracer is injected into your vein. As you lie on the table, a special gamma camera is positioned over your abdomen taking pictures of the tracer as it moves through your body. The gamma camera takes pictures continually for about an hour. You'll need to keep still during the HIDA scan. This can become uncomfortable, but you may find that you can lessen the discomfort by taking deep breaths and thinking about other things. Tell your health care team if you're uncomfortable. The radiologist will watch on a computer the progress of the radioactive tracer through your body. The HIDA scan may be stopped when the radioactive tracer is seen in the gallbladder and enters your small intestine. This typically takes about an hour.  In some cases extra imaging will be performed if original images aren't satisfactory, if morphine is given to help visualize the gallbladder or if the medication CCK is given to look at the contraction of the gallbladder. This test typically takes 2 hours to complete. ________________________________________________________________________   Carla Gonzalez have been scheduled for  an endoscopy on 3-13. Please follow written instructions given to you at your visit today. If you use inhalers (even only as needed), please bring them with you on the day of your procedure.  Take pantoprazole 40 mg once daily. Hold until after you submit the stool sample to the lab.  We have sent the following medications to your pharmacy for you to pick up at your convenience: Bentyl 20 mg: Take three times a day as needed  Use zofran as needed.  Thank you for entrusting me with your care and for choosing [facility], Dr. Zenovia Jarred

## 2021-04-29 NOTE — Progress Notes (Signed)
Patient ID: Carla Gonzalez, female   DOB: [birth year], 36 y.o.   MRN: [id number] HPI: Carla Gonzalez is a 36 year old female with little past medical history other than hypothyroidism, prior appendectomy and cesarean section, who is here to evaluate abdominal pain.  She is here alone today.  She reports that she has had episodic epigastric and right upper quadrant abdominal pain over the last 1 year and perhaps longer.  Her most severe episode happened on Monday of this week, 3 days ago.  She describes this as an epigastric gnawing pain which spreads to the right upper quadrant and eventually into the right back and right shoulder blade.  This became so severe that she went to the emergency department.  In the ER CT scan and right upper quadrant ultrasound were normal.  Lab work was unrevealing, though her white count was mildly elevated at 16,000.  Pain was associated with nausea.  She had vomiting x1 with no improvement.  She did have loose stools and even diarrhea for several days which seems to be a bit better today.  Diarrhea is not always associated with these attacks.  She had a similar attack of the pain though not as severe 2 days before her ER visit after eating steak for dinner.  She typically avoids steak and fatty meals.  She often feels wiped out after these episodes.  Her appetite has been poor.  She does have an abdominal fullness and bloating sensation at times.  She tried probiotic but no help.  Her only consistent medicine is her OCP and levothyroxine.  In the ER she was given ondansetron which may help a little.  Oxycodone which she only tried once and did not really help.  Pantoprazole at 20 mg daily which she took 2 days and could not tell much difference.  And sucralfate which she was given but could not tell it really helped.  She is no longer using any of these recently prescribed medicines.  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 Married with 2 young children age 57 and 54. No tobacco. No family history of celiac disease, IBD or GI tract malignancy.  Past Medical History:  Diagnosis Date   Pectus excavatum    Renal cyst 2023   left; benign appearing    Past Surgical History:  Procedure Laterality Date   APPENDECTOMY     CESAREAN SECTION N/A 08/12/2019   Procedure: CESAREAN SECTION;  Surgeon: Jerelyn Charles, MD;  Location: [facility] LD ORS;  Service: Obstetrics;  Laterality: N/A;   Grommet     TONSILLECTOMY AND ADENOIDECTOMY  2009    Outpatient Medications Prior to Visit  Medication Sig Dispense Refill   levothyroxine (SYNTHROID) 75 MCG tablet Take 1 tablet (75 mcg total) by mouth daily. 90 tablet 0   meclizine (ANTIVERT) 25 MG tablet Take 1 tablet (25 mg total) by mouth 3 (three) times daily as needed for dizziness. 30 tablet 0   methylPREDNISolone (MEDROL DOSEPAK) 4 MG TBPK tablet Take pills daily all together with food. Take the first dose (6 pills) as soon as possible. Take the rest each morning. Decrease by 1 tablet every morning (6-5-4-3-2-1) 21 tablet 1   norethindrone-ethinyl estradiol-FE (LOESTRIN FE 1/20) 1-20 MG-MCG tablet Take 1 tablet by mouth daily. 84 tablet 4   ondansetron (ZOFRAN-ODT) 4 MG disintegrating tablet Take 1 tablet (4 mg total) by mouth every 8 (eight) hours as needed for nausea or vomiting. 20 tablet 0  oxyCODONE (ROXICODONE) 5 MG immediate release tablet Take 1 tablet (5 mg total) by mouth every 4 (four) hours as needed for severe pain. 15 tablet 0   sucralfate (CARAFATE) 1 g tablet Take 1 tablet (1 g total) by mouth 4 (four) times daily -  with meals and at bedtime for 14 days. 56 tablet 0   pantoprazole (PROTONIX) 20 MG tablet Take 1 tablet (20 mg total) by mouth daily. 30 tablet 0   No facility-administered medications prior to visit.    Allergies  Allergen Reactions   Sulfa Antibiotics Hives    Family History  Problem Relation Age of Onset    Hypertension Father    Melanoma Father    Migraines Neg Hx    Colon cancer Neg Hx    Stomach cancer Neg Hx     Social History   Tobacco Use   Smoking status: Never   Smokeless tobacco: Never  Vaping Use   Vaping Use: Never used  Substance Use Topics   Alcohol use: Not Currently   Drug use: Never    ROS: As per history of present illness, otherwise negative  BP 110/70    Pulse 77    Ht 5\' 7"  (1.702 m)    Wt 199 lb 2 oz (90.3 kg)    LMP 04/19/2021 (Approximate)    BMI 31.19 kg/m  Constitutional: Well-developed and well-nourished. No distress. HEENT: Normocephalic and atraumatic.  No scleral icterus. Cardiovascular: Normal rate, regular rhythm and intact distal pulses. No M/R/G Pulmonary/chest: Effort normal and breath sounds normal. No wheezing, rales or rhonchi. Abdominal: Soft, tender in the right upper quadrant particularly with exhalation,  nondistended. Bowel sounds active throughout. There are no masses palpable. No hepatosplenomegaly. Extremities: no clubbing, cyanosis, or edema Neurological: Alert and oriented to person place and time. Skin: Skin is warm and dry.  Psychiatric: Normal mood and affect. Behavior is normal.  RELEVANT LABS AND IMAGING: CBC    Component Value Date/Time   WBC 16.5 (H) 04/26/2021 1204   RBC 4.94 04/26/2021 1204   HGB 14.4 04/26/2021 1204   HCT 43.2 04/26/2021 1204   PLT 279 04/26/2021 1204   MCV 87.4 04/26/2021 1204   MCH 29.1 04/26/2021 1204   MCHC 33.3 04/26/2021 1204   RDW 13.5 04/26/2021 1204   LYMPHSABS 2,331 01/26/2021 0934   MONOABS 0.6 08/18/2019 0111   EOSABS 269 01/26/2021 0934   BASOSABS 47 01/26/2021 0934    CMP     Component Value Date/Time   NA 139 04/26/2021 1204   K 3.9 04/26/2021 1204   CL 103 04/26/2021 1204   CO2 25 04/26/2021 1204   GLUCOSE 99 04/26/2021 1204   BUN 16 04/26/2021 1204   CREATININE 0.74 04/26/2021 1204   CREATININE 0.75 01/26/2021 0934   CALCIUM 9.2 04/26/2021 1204   PROT 7.1 04/26/2021  1204   ALBUMIN 4.6 04/26/2021 1204   AST 12 (L) 04/26/2021 1204   ALT 9 04/26/2021 1204   ALKPHOS 59 04/26/2021 1204   BILITOT 0.7 04/26/2021 1204   GFRNONAA >60 04/26/2021 1204   GFRNONAA 115 09/03/2020 1235   GFRAA 134 09/03/2020 1235   Lipase     Component Value Date/Time   LIPASE 22 04/26/2021 1204  ULTRASOUND ABDOMEN LIMITED RIGHT UPPER QUADRANT   COMPARISON:  None.   FINDINGS: Gallbladder:   No gallstones or wall thickening visualized. No sonographic Murphy sign noted by sonographer.   Common bile duct:   Diameter: 3.7 mm   Liver:   No  focal lesion identified. Within normal limits in parenchymal echogenicity. Portal vein is patent on color Doppler imaging with normal direction of blood flow towards the liver.   Other: None.   IMPRESSION: No sonographic abnormality is seen in the right upper quadrant of abdomen.     Electronically Signed   By: Elmer Picker M.D.   On: 04/26/2021 12:55    CT ABDOMEN AND PELVIS WITH CONTRAST   TECHNIQUE: Multidetector CT imaging of the abdomen and pelvis was performed using the standard protocol following bolus administration of intravenous contrast.   RADIATION DOSE REDUCTION: This exam was performed according to the departmental dose-optimization program which includes automated exposure control, adjustment of the mA and/or kV according to patient size and/or use of iterative reconstruction technique.   CONTRAST:  90mL OMNIPAQUE IOHEXOL 300 MG/ML  SOLN   COMPARISON:  Ultrasound of the abdomen on 04/26/2021, CT angio chest 08/18/2019   FINDINGS: Lower chest: No acute abnormality. Pectus excavatum noted, with AP diameter of the chest measuring 7.2 centimeters from sternum to thoracic spine.   Hepatobiliary: No focal liver abnormality is seen. No radiopaque gallstones, biliary dilatation, or pericholecystic inflammatory changes.   Pancreas: Unremarkable. No pancreatic ductal dilatation or surrounding  inflammatory changes.   Spleen: Normal in size without focal abnormality.   Adrenals/Urinary Tract: Adrenal glands are normal.   RIGHT kidney is unremarkable.  RIGHT ureter course is normal.   Cyst in the LOWER pole of the LEFT kidney measures 3.9 centimeters in diameter. There is no hydronephrosis. LEFT ureter is unremarkable.   Stomach/Bowel: The stomach and small bowel loops are normal in appearance. Surgical clips in the region of cecum consistent with previous appendectomy. Loops of colon are unremarkable.   Vascular/Lymphatic: No significant vascular findings are present. No enlarged abdominal or pelvic lymph nodes.   Reproductive: Uterus and bilateral adnexa are unremarkable.   Other: No abdominal wall hernia or abnormality. No abdominopelvic ascites.   Musculoskeletal: No fracture is seen.   IMPRESSION: 1. No acute abnormality of the abdomen pelvis. 2. Pectus excavatum. 3. Benign LEFT renal cyst. 4. Prior appendectomy.     Electronically Signed   By: Nolon Nations M.D.   On: 04/26/2021 14:43     ASSESSMENT/PLAN:  36 year old female with little past medical history other than hypothyroidism, prior appendectomy and cesarean section, who is here to evaluate abdominal pain.  Epigastric and right upper quadrant abdominal pain --this seems consistent with gallbladder pain, and I am surprised her ultrasound was negative.  Query biliary dyskinesia.  We also discussed other possibilities including peptic ulcer disease/acid peptic related inflammation, H. Pylori.  Unlikely but this could be an allergy given that it seems to be worse when she eats meat (question alpha gal).  Celiac disease seems unlikely but should be excluded. --GI pathogen panel given recent diarrhea; H. pylori stool antigen -- Celiac and alpha gal panel --Begin pantoprazole 40 mg daily; she only use low-dose pantoprazole for 2 days which would not be enough to improve acid peptic related inflammation  or ulcer.  Submit stool studies before beginning PPI --Bentyl 20 mg 3 times daily as needed --Zofran ODT 4 mg every 6 to 8 hours as needed nausea --Low-fat diet --HIDA scan --If HIDA scan negative proceed to EGD; we reviewed the risk, benefits and alternatives and she is agreeable and wishes to proceed      SF:KCLE[facility]N, Cresenciano Lick, [place] [address] [place],  [place] [address]

## 2021-05-06 ENCOUNTER — Ambulatory Visit (HOSPITAL_COMMUNITY)
Admission: RE | Admit: 2021-05-06 | Discharge: 2021-05-06 | Disposition: A | Discharge status: Home / Self Care | Payer: No Typology Code available for payment source | Source: Ambulatory Visit | Attending: Internal Medicine | Admitting: Internal Medicine

## 2021-05-06 ENCOUNTER — Other Ambulatory Visit: Payer: Self-pay

## 2021-05-06 DIAGNOSIS — R630 Anorexia: Secondary | ICD-10-CM | POA: Insufficient documentation

## 2021-05-06 DIAGNOSIS — R112 Nausea with vomiting, unspecified: Secondary | ICD-10-CM | POA: Insufficient documentation

## 2021-05-06 DIAGNOSIS — R1013 Epigastric pain: Secondary | ICD-10-CM | POA: Diagnosis present

## 2021-05-06 DIAGNOSIS — R195 Other fecal abnormalities: Secondary | ICD-10-CM | POA: Diagnosis present

## 2021-05-06 LAB — IGA: Immunoglobulin A: 168 mg/dL (ref 47–310)

## 2021-05-06 LAB — ALPHA-GAL PANEL
Allergen, Mutton, f88: <0.1 kU/L
Allergen, Pork, f26: <0.1 kU/L
Beef: <0.1 kU/L
CLASS: 0
CLASS: 0
Class: 0
GALACTOSE-ALPHA-1,3-GALACTOSE IGE*: <0.1 kU/L (ref <0.10)

## 2021-05-06 LAB — INTERPRETATION:

## 2021-05-06 LAB — TISSUE TRANSGLUTAMINASE, IGA: (tTG) Ab, IgA: <1 U/mL

## 2021-05-06 MED ORDER — TECHNETIUM TC 99M MEBROFENIN IV KIT
5.2000 | PACK | Freq: Once | INTRAVENOUS | Status: AC | PRN
  Administered 2021-05-06: 5.2 via INTRAVENOUS

## 2021-05-07 ENCOUNTER — Other Ambulatory Visit (HOSPITAL_COMMUNITY): Payer: Self-pay

## 2021-05-17 ENCOUNTER — Encounter: Payer: Self-pay | Admitting: Internal Medicine

## 2021-05-17 ENCOUNTER — Ambulatory Visit (AMBULATORY_SURGERY_CENTER): Payer: No Typology Code available for payment source | Admitting: Internal Medicine

## 2021-05-17 VITALS — BP 109/76 | HR 49 | Temp 98.4°F | Resp 12 | Ht 67.0 in | Wt 199.0 lb

## 2021-05-17 DIAGNOSIS — R1013 Epigastric pain: Secondary | ICD-10-CM

## 2021-05-17 DIAGNOSIS — K319 Disease of stomach and duodenum, unspecified: Secondary | ICD-10-CM | POA: Diagnosis not present

## 2021-05-17 DIAGNOSIS — K3189 Other diseases of stomach and duodenum: Secondary | ICD-10-CM | POA: Diagnosis not present

## 2021-05-17 MED ORDER — SODIUM CHLORIDE 0.9 % IV SOLN: 500.0000 mL | Freq: Once | INTRAVENOUS | Status: DC

## 2021-05-17 NOTE — Progress Notes (Signed)
Report to PACU, RN, vss, BBS= Clear.  

## 2021-05-17 NOTE — Op Note (Signed)
[place] ?Patient Name: Carla Gonzalez ?Procedure Date: 05/17/2021 2:26 PM ?MRN: [id number] ?Endoscopist: Jerene Bears , MD ?Age: 36 ?Referring MD:  ?Date of Birth: [birth year] ?Gender: Female ?Account #: [id number] ?Procedure:                Upper GI endoscopy ?Indications:              Epigastric abdominal pain (unremarkable CT  ?                          abd/pelvis, Abd [place] and HIDA scan) ?Medicines:                Monitored Anesthesia Care ?Procedure:                Pre-Anesthesia Assessment: ?                          - Prior to the procedure, a History and Physical  ?                          was performed, and patient medications and  ?                          allergies were reviewed. The patient's tolerance of  ?                          previous anesthesia was also reviewed. The risks  ?                          and benefits of the procedure and the sedation  ?                          options and risks were discussed with the patient.  ?                          All questions were answered, and informed consent  ?                          was obtained. Prior Anticoagulants: The patient has  ?                          taken no previous anticoagulant or antiplatelet  ?                          agents. ASA Grade Assessment: II - A patient with  ?                          mild systemic disease. After reviewing the risks  ?                          and benefits, the patient was deemed in  ?                          satisfactory condition to undergo the procedure. ?  After obtaining informed consent, the endoscope was  ?                          passed under direct vision. Throughout the  ?                          procedure, the patient's blood pressure, pulse, and  ?                          oxygen saturations were monitored continuously. The  ?                          GIF [id number] #[id number] was introduced through the  ?                          mouth, and advanced to the second  part of duodenum.  ?                          The upper GI endoscopy was accomplished without  ?                          difficulty. The patient tolerated the procedure  ?                          well. ?Scope In: ?Scope Out: ?Findings:                 The examined esophagus was normal. Z-line is  ?                          regular at 40 cm. ?                          A single 10 mm submucosal papule (nodule) with no  ?                          bleeding and no stigmata of recent bleeding was  ?                          found on the greater curvature of the gastric body.  ?                          Biopsies were taken with a cold forceps for  ?                          histology. ?                          The exam of the stomach was otherwise normal. ?                          Biopsies were taken with a cold forceps in the  ?                          gastric body, at  the incisura and in the gastric  ?                          antrum for histology and Helicobacter pylori  ?                          testing. ?                          The examined duodenum was normal. ?Complications:            No immediate complications. ?Estimated Blood Loss:     Estimated blood loss was minimal. ?Impression:               - Normal esophagus. ?                          - A single submucosal papule (nodule) found in the  ?                          stomach. Biopsied. ?                          - Normal examined duodenum. ?                          - Biopsies were taken with a cold forceps for  ?                          histology and Helicobacter pylori testing. ?Recommendation:           - Patient has a contact number available for  ?                          emergencies. The signs and symptoms of potential  ?                          delayed complications were discussed with the  ?                          patient. Return to normal activities tomorrow.  ?                          Written discharge instructions were provided to the   ?                          patient. ?                          - Resume previous diet. ?                          - Continue present medications. ?                          - Await pathology results. ?Jerene Bears, MD ?05/17/2021 2:49:13 PM ?This report has been signed electronically. ?

## 2021-05-17 NOTE — Progress Notes (Signed)
VS completed by CW.   Pt's states no medical or surgical changes since previsit or office visit.  

## 2021-05-17 NOTE — Progress Notes (Signed)
Called to room to assist during endoscopic procedure.  Patient ID and intended procedure confirmed with present staff. Received instructions for my participation in the procedure from the performing physician.  

## 2021-05-17 NOTE — Progress Notes (Signed)
See my office note dated 04/29/2021 for details ?Patient presenting for EGD to evaluate epigastric pain after unremarkable CT abdomen pelvis and ultrasound plus HIDA ? ?She remains appropriate for [facility] EGD today ?

## 2021-05-17 NOTE — Patient Instructions (Signed)
Await pathology from the biopsies taken today. ? ?Resume previous diet and medications. ? ? ?YOU HAD AN ENDOSCOPIC PROCEDURE TODAY AT [facility]:   Refer to the procedure report that was given to you for any specific questions about what was found during the examination.  If the procedure report does not answer your questions, please call your gastroenterologist to clarify.  If you requested that your care partner not be given the details of your procedure findings, then the procedure report has been included in a sealed envelope for you to review at your convenience later. ? ?YOU SHOULD EXPECT: Some feelings of bloating in the abdomen. Passage of more gas than usual.  Walking can help get rid of the air that was put into your GI tract during the procedure and reduce the bloating. If you had a lower endoscopy (such as a colonoscopy or flexible sigmoidoscopy) you may notice spotting of blood in your stool or on the toilet paper. If you underwent a bowel prep for your procedure, you may not have a normal bowel movement for a few days. ? ?Please Note:  You might notice some irritation and congestion in your nose or some drainage.  This is from the oxygen used during your procedure.  There is no need for concern and it should clear up in a day or so. ? ?SYMPTOMS TO REPORT IMMEDIATELY: ? ?Following upper endoscopy (EGD) ? Vomiting of blood or coffee ground material ? New chest pain or pain under the shoulder blades ? Painful or persistently difficult swallowing ? New shortness of breath ? Fever of 100?F or higher ? Black, tarry-looking stools ? ?For urgent or emergent issues, a gastroenterologist can be reached at any hour by calling [phone]. ?Do not use MyChart messaging for urgent concerns.  ? ? ?DIET:  We do recommend a small meal at first, but then you may proceed to your regular diet.  Drink plenty of fluids but you should avoid alcoholic beverages for 24 hours. ? ?ACTIVITY:  You should plan  to take it easy for the rest of today and you should NOT DRIVE or use heavy machinery until tomorrow (because of the sedation medicines used during the test).   ? ?FOLLOW UP: ?Our staff will call the number listed on your records 48-72 hours following your procedure to check on you and address any questions or concerns that you may have regarding the information given to you following your procedure. If we do not reach you, we will leave a message.  We will attempt to reach you two times.  During this call, we will ask if you have developed any symptoms of COVID 19. If you develop any symptoms (ie: fever, flu-like symptoms, shortness of breath, cough etc.) before then, please call [phone].  If you test positive for Covid 19 in the 2 weeks post procedure, please call and report this information to [place].   ? ?If any biopsies were taken you will be contacted by phone or by letter within the next 1-3 weeks.  Please call us at [phone] if you have not heard about the biopsies in 3 weeks.  ? ? ?SIGNATURES/CONFIDENTIALITY: ?You and/or your care partner have signed paperwork which will be entered into your electronic medical record.  These signatures attest to the fact that that the information above on your After Visit Summary has been reviewed and is understood.  Full responsibility of the confidentiality of this discharge information lies with you and/or your care-partner.  ?

## 2021-05-18 ENCOUNTER — Other Ambulatory Visit: Payer: Self-pay | Admitting: Internal Medicine

## 2021-05-18 MED ORDER — LEVOTHYROXINE SODIUM 75 MCG PO TABS
75.0000 ug | ORAL_TABLET | Freq: Every day | ORAL | 0 refills | Status: DC
  Filled 2021-05-18: qty 90, 90d supply, fill #0

## 2021-05-19 ENCOUNTER — Other Ambulatory Visit (HOSPITAL_COMMUNITY): Payer: Self-pay

## 2021-05-19 ENCOUNTER — Telehealth: Payer: Self-pay | Admitting: *Deleted

## 2021-05-19 NOTE — Telephone Encounter (Signed)
No answer on first attempt follow up call. Left message.  ?

## 2021-05-19 NOTE — Telephone Encounter (Signed)
?  Follow up Call- ? ?Call back number 05/17/2021  ?Post procedure Call Back phone  # [phone]  ?Permission to leave phone message Yes  ?Some recent data might be hidden  ?  ? ?Patient questions: ? ?Do you have a fever, pain , or abdominal swelling? No. ?Pain Score  0 * ? ?Have you tolerated food without any problems? Yes.   ? ?Have you been able to return to your normal activities? Yes.   ? ?Do you have any questions about your discharge instructions: ?Diet   No. ?Medications  No. ?Follow up visit  No. ? ?Do you have questions or concerns about your Care? No. ? ?Actions: ?* If pain score is 4 or above: ?No action needed, pain <4. ? ? ?

## 2021-07-06 ENCOUNTER — Other Ambulatory Visit (HOSPITAL_COMMUNITY): Payer: Self-pay

## 2021-08-14 ENCOUNTER — Other Ambulatory Visit: Payer: Self-pay | Admitting: Internal Medicine

## 2021-08-18 ENCOUNTER — Other Ambulatory Visit (HOSPITAL_COMMUNITY): Payer: Self-pay

## 2021-08-18 MED ORDER — LEVOTHYROXINE SODIUM 75 MCG PO TABS
75.0000 ug | ORAL_TABLET | Freq: Every day | ORAL | 0 refills | Status: DC
  Filled 2021-08-18: qty 90, 90d supply, fill #0

## 2021-08-18 NOTE — Telephone Encounter (Signed)
Patient has scheduled 6 month follow up for TSH,

## 2021-08-19 ENCOUNTER — Telehealth: Payer: Self-pay | Admitting: Internal Medicine

## 2021-08-19 NOTE — Telephone Encounter (Signed)
This message was sent via [place], a product from [employer] [email or web address].[email or web address]/                    -------Fax Transmission Report-------  To:               Recipient at [phone] Subject:          FW: Hp Scans Result:           The transmission was successful. Explanation:      All Pages Ok Pages Sent:       2 Connect Time:     1 minutes, 20 seconds Transmit Time:    08/19/2021 15:23 Transfer Rate:    14400 Status Code:      0000 Retry Count:      0 Job Id:           [id number] Unique Id:        [id number] Fax Line:         31 Fax Server:       [id number]

## 2021-08-19 NOTE — Telephone Encounter (Signed)
Faxed signed form to pharmacy approving change in medication.  [place] [facility] Phone:  [phone]  Fax:  [phone]

## 2021-08-31 ENCOUNTER — Other Ambulatory Visit: Payer: No Typology Code available for payment source

## 2021-08-31 DIAGNOSIS — E038 Other specified hypothyroidism: Secondary | ICD-10-CM

## 2021-09-01 LAB — TSH: TSH: 4.15 mIU/L

## 2021-09-02 ENCOUNTER — Other Ambulatory Visit: Payer: No Typology Code available for payment source

## 2021-09-03 ENCOUNTER — Other Ambulatory Visit (HOSPITAL_COMMUNITY): Payer: Self-pay

## 2021-09-03 ENCOUNTER — Ambulatory Visit (INDEPENDENT_AMBULATORY_CARE_PROVIDER_SITE_OTHER): Payer: No Typology Code available for payment source | Admitting: Internal Medicine

## 2021-09-03 ENCOUNTER — Encounter: Payer: Self-pay | Admitting: Internal Medicine

## 2021-09-03 VITALS — BP 124/82 | HR 64 | Temp 98.4°F | Wt 201.5 lb

## 2021-09-03 DIAGNOSIS — R42 Dizziness and giddiness: Secondary | ICD-10-CM

## 2021-09-03 DIAGNOSIS — E038 Other specified hypothyroidism: Secondary | ICD-10-CM | POA: Diagnosis not present

## 2021-09-03 DIAGNOSIS — E063 Autoimmune thyroiditis: Secondary | ICD-10-CM | POA: Diagnosis not present

## 2021-09-03 MED ORDER — PREDNISONE 10 MG (21) PO TBPK
ORAL_TABLET | ORAL | 0 refills | Status: DC
  Filled 2021-09-03: qty 21, 6d supply, fill #0

## 2021-09-03 MED ORDER — LEVOTHYROXINE SODIUM 88 MCG PO TABS
88.0000 ug | ORAL_TABLET | Freq: Every day | ORAL | 3 refills | Status: DC
  Filled 2021-09-03: qty 90, 90d supply, fill #0
  Filled 2021-11-24: qty 90, 90d supply, fill #1
  Filled 2022-02-08: qty 90, 90d supply, fill #2
  Filled 2022-05-03: qty 90, 90d supply, fill #3

## 2021-09-03 NOTE — Progress Notes (Signed)
   Subjective:    Patient ID: Carla Gonzalez, female    DOB: May 09, 1985, 36 y.o.   MRN: [id number]  HPI 36 year old Female here for follow up on hypothyroidism but is still having issues with vertigo.Have checked CBC with diff today, Fe/TIBC free T4, TSH, B12 and folate. All of these labs  are normal.  She began having issues with recurrent dizziness in mid November.  She said she had similar symptoms before starting thyroid replacement medication for hypothyroidism.  Symptoms seem to start in the morning and she has issues throughout the day.  In June 2022 she had issues with tachycardia and had some vague dizziness with fatigue.  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  She is married and has 2 children.  History of acute maxillary sinusitis March 2022.  Had respiratory syncytial virus August 2021.  Had melanocytic nevus removed from left neck by dermatologist April 2022.  Had admission to the hospital June 2021 after developing some chest tightness and bradycardia.  Was seen by Dr. Rayann Heman who felt patient had increased vagal tone with recent pregnancy.  Echocardiogram was normal.  She had bradycardia with sinus arrhythmia and rate of 42.  She does not smoke or consume alcohol.  Family history: Remarkable for father with history of melanoma and hypertension.  Past medical history: She has had an appendectomy, C-section, tonsillectomy and adenoidectomy.    Review of Systems having hot flashes. Dizzy again     Objective:   Physical Exam Vital signs are reviewed today.  Her speech is normal.  There is no dysarthria.  Extraocular movements are full.  There is right word nystagmus.  PERRLA.  Muscle strength is normal.  Moves all 4 extremities without issue.  Gait is normal.       Assessment & Plan:  Recurrent vertigo  Hypothyroidism-treated with thyroid replacement medication  Plan: We discussed this at  length today.  She did get some improvement when Dr. Jaynee Eagles treated her for vertigo in November with steroids.  We are going to try this again and see if symptoms improve.  If not, she is going to need MRI/MRA of the brain.  She will continue with meclizine for now.  I offered her Xanax but she declines and will just stay with the meclizine for vertigo.

## 2021-09-04 LAB — CBC WITH DIFFERENTIAL/PLATELET
Absolute Monocytes: 370 cells/uL (ref 200–950)
Basophils Absolute: 62 cells/uL (ref 0–200)
Basophils Relative: 0.8 %
Eosinophils Absolute: 208 cells/uL (ref 15–500)
Eosinophils Relative: 2.7 %
HCT: 40.6 % (ref 35.0–45.0)
Hemoglobin: 13.5 g/dL (ref 11.7–15.5)
Lymphs Abs: 2241 cells/uL (ref 850–3900)
MCH: 29.4 pg (ref 27.0–33.0)
MCHC: 33.3 g/dL (ref 32.0–36.0)
MCV: 88.5 fL (ref 80.0–100.0)
MPV: 9.6 fL (ref 7.5–12.5)
Monocytes Relative: 4.8 %
Neutro Abs: 4820 cells/uL (ref 1500–7800)
Neutrophils Relative %: 62.6 %
Platelets: 302 10*3/uL (ref 140–400)
RBC: 4.59 10*6/uL (ref 3.80–5.10)
RDW: 12.8 % (ref 11.0–15.0)
Total Lymphocyte: 29.1 %
WBC: 7.7 10*3/uL (ref 3.8–10.8)

## 2021-09-04 LAB — T4, FREE: Free T4: 1.2 ng/dL (ref 0.8–1.8)

## 2021-09-04 LAB — COMPLETE METABOLIC PANEL WITH GFR
AG Ratio: 1.7 (calc) (ref 1.0–2.5)
ALT: 11 U/L (ref 6–29)
AST: 14 U/L (ref 10–30)
Albumin: 4.3 g/dL (ref 3.6–5.1)
Alkaline phosphatase (APISO): 64 U/L (ref 31–125)
BUN: 12 mg/dL (ref 7–25)
CO2: 24 mmol/L (ref 20–32)
Calcium: 9.4 mg/dL (ref 8.6–10.2)
Chloride: 108 mmol/L (ref 98–110)
Creat: 0.81 mg/dL (ref 0.50–0.97)
Globulin: 2.5 g/dL (calc) (ref 1.9–3.7)
Glucose, Bld: 80 mg/dL (ref 65–139)
Potassium: 4.3 mmol/L (ref 3.5–5.3)
Sodium: 140 mmol/L (ref 135–146)
Total Bilirubin: 0.4 mg/dL (ref 0.2–1.2)
Total Protein: 6.8 g/dL (ref 6.1–8.1)
eGFR: 98 mL/min/{1.73_m2} (ref >=60)

## 2021-09-04 LAB — B12 AND FOLATE PANEL
Folate: 15.7 ng/mL
Vitamin B-12: 358 pg/mL (ref 200–1100)

## 2021-09-04 LAB — IRON, TOTAL/TOTAL IRON BINDING CAP
%SAT: 17 % (calc) (ref 16–45)
Iron: 62 ug/dL (ref 40–190)
TIBC: 374 mcg/dL (calc) (ref 250–450)

## 2021-09-10 ENCOUNTER — Emergency Department (HOSPITAL_COMMUNITY)
Admission: EM | Admit: 2021-09-10 | Discharge: 2021-09-10 | Disposition: A | Discharge status: Home / Self Care | Payer: No Typology Code available for payment source | Attending: Emergency Medicine | Admitting: Emergency Medicine

## 2021-09-10 ENCOUNTER — Emergency Department (HOSPITAL_COMMUNITY): Payer: No Typology Code available for payment source

## 2021-09-10 ENCOUNTER — Ambulatory Visit (INDEPENDENT_AMBULATORY_CARE_PROVIDER_SITE_OTHER): Payer: No Typology Code available for payment source | Admitting: Internal Medicine

## 2021-09-10 ENCOUNTER — Other Ambulatory Visit: Payer: Self-pay

## 2021-09-10 ENCOUNTER — Telehealth: Payer: Self-pay | Admitting: Internal Medicine

## 2021-09-10 ENCOUNTER — Other Ambulatory Visit (HOSPITAL_COMMUNITY): Payer: Self-pay

## 2021-09-10 ENCOUNTER — Encounter (HOSPITAL_COMMUNITY): Payer: Self-pay

## 2021-09-10 ENCOUNTER — Encounter: Payer: Self-pay | Admitting: Internal Medicine

## 2021-09-10 VITALS — BP 138/88 | HR 57 | Temp 97.8°F

## 2021-09-10 DIAGNOSIS — R42 Dizziness and giddiness: Secondary | ICD-10-CM

## 2021-09-10 DIAGNOSIS — H73893 Other specified disorders of tympanic membrane, bilateral: Secondary | ICD-10-CM | POA: Insufficient documentation

## 2021-09-10 DIAGNOSIS — R519 Headache, unspecified: Secondary | ICD-10-CM | POA: Diagnosis not present

## 2021-09-10 DIAGNOSIS — R531 Weakness: Secondary | ICD-10-CM | POA: Insufficient documentation

## 2021-09-10 HISTORY — DX: Hypothyroidism, unspecified: E03.9

## 2021-09-10 LAB — CBC WITH DIFFERENTIAL/PLATELET
Abs Immature Granulocytes: 0.05 K/uL (ref 0.00–0.07)
Basophils Absolute: 0.1 K/uL (ref 0.0–0.1)
Basophils Relative: 1 %
Eosinophils Absolute: 0.3 K/uL (ref 0.0–0.5)
Eosinophils Relative: 3 %
HCT: 40.8 % (ref 36.0–46.0)
Hemoglobin: 13.4 g/dL (ref 12.0–15.0)
Immature Granulocytes: 1 %
Lymphocytes Relative: 25 %
Lymphs Abs: 2.7 K/uL (ref 0.7–4.0)
MCH: 29.1 pg (ref 26.0–34.0)
MCHC: 32.8 g/dL (ref 30.0–36.0)
MCV: 88.5 fL (ref 80.0–100.0)
Monocytes Absolute: 0.5 K/uL (ref 0.1–1.0)
Monocytes Relative: 5 %
Neutro Abs: 7.3 K/uL (ref 1.7–7.7)
Neutrophils Relative %: 65 %
Platelets: 303 K/uL (ref 150–400)
RBC: 4.61 MIL/uL (ref 3.87–5.11)
RDW: 13.2 % (ref 11.5–15.5)
WBC: 10.9 K/uL — ABNORMAL HIGH (ref 4.0–10.5)
nRBC: 0 % (ref 0.0–0.2)

## 2021-09-10 LAB — BASIC METABOLIC PANEL
Anion gap: 9 (ref 5–15)
BUN: 10 mg/dL (ref 6–20)
CO2: 28 mmol/L (ref 22–32)
Calcium: 8.8 mg/dL — ABNORMAL LOW (ref 8.9–10.3)
Chloride: 104 mmol/L (ref 98–111)
Creatinine, Ser: 0.86 mg/dL (ref 0.44–1.00)
GFR, Estimated: >60 mL/min (ref >60)
Glucose, Bld: 92 mg/dL (ref 70–99)
Potassium: 3.6 mmol/L (ref 3.5–5.1)
Sodium: 141 mmol/L (ref 135–145)

## 2021-09-10 LAB — I-STAT BETA HCG BLOOD, ED (MC, WL, AP ONLY): I-stat hCG, quantitative: <5 m[IU]/mL

## 2021-09-10 LAB — TSH: TSH: 2.997 u[IU]/mL (ref 0.350–4.500)

## 2021-09-10 MED ORDER — GADOBUTROL 1 MMOL/ML IV SOLN
9.0000 mL | Freq: Once | INTRAVENOUS | Status: AC | PRN
  Administered 2021-09-10: 9 mL via INTRAVENOUS

## 2021-09-10 MED ORDER — MECLIZINE HCL 25 MG PO TABS
25.0000 mg | ORAL_TABLET | Freq: Three times a day (TID) | ORAL | 0 refills | Status: DC | PRN
  Filled 2021-09-10: qty 30, 10d supply, fill #0

## 2021-09-10 MED ORDER — PREDNISONE 50 MG PO TABS
50.0000 mg | ORAL_TABLET | Freq: Every day | ORAL | 0 refills | Status: DC
  Filled 2021-09-10: qty 5, 5d supply, fill #0

## 2021-09-10 MED ORDER — MECLIZINE HCL 25 MG PO TABS
25.0000 mg | ORAL_TABLET | Freq: Once | ORAL | Status: AC
  Administered 2021-09-10: 25 mg via ORAL
  Filled 2021-09-10: qty 1

## 2021-09-10 MED ORDER — ONDANSETRON 4 MG PO TBDP
4.0000 mg | ORAL_TABLET | Freq: Three times a day (TID) | ORAL | 0 refills | Status: DC | PRN
  Filled 2021-09-10: qty 20, 7d supply, fill #0

## 2021-09-10 MED ORDER — LORAZEPAM 0.5 MG PO TABS
1.0000 mg | ORAL_TABLET | Freq: Three times a day (TID) | ORAL | 0 refills | Status: DC | PRN
  Filled 2021-09-10: qty 10, 2d supply, fill #0

## 2021-09-10 MED ORDER — LACTATED RINGERS IV BOLUS
1000.0000 mL | Freq: Once | INTRAVENOUS | Status: AC
  Administered 2021-09-10: 1000 mL via INTRAVENOUS

## 2021-09-10 MED ORDER — ONDANSETRON HCL 4 MG/2ML IJ SOLN
4.0000 mg | Freq: Once | INTRAMUSCULAR | Status: AC
  Administered 2021-09-10: 4 mg via INTRAVENOUS
  Filled 2021-09-10: qty 2

## 2021-09-10 MED ORDER — LORAZEPAM 2 MG/ML IJ SOLN
0.5000 mg | Freq: Once | INTRAMUSCULAR | Status: AC
  Administered 2021-09-10: 0.5 mg via INTRAVENOUS
  Filled 2021-09-10: qty 1

## 2021-09-10 NOTE — ED Notes (Signed)
Patient transported to MRI 

## 2021-09-10 NOTE — Telephone Encounter (Signed)
Carla Gonzalez [phone]  Carla Gonzalez called to say she is very dizzy and just really feels bad today and unable to see patients, so I had her just come on in.

## 2021-09-10 NOTE — Discharge Instructions (Signed)
Your MRI shows no evidence of stroke and no narrowing of the blood vessels to suggest any vascular insufficiency.  You may use Ativan as needed for dizziness but remember it will make you sleepy so do not take it if you are working or driving.  Follow-up with your primary care doctor as well as the ear nose and throat doctor regarding further evaluation of your dizziness. Return to the ED with worsening headache, difficulty speaking, difficulty swallowing, unilateral weakness, numbness, tingling, difficulty breathing, chest pain, other concerns

## 2021-09-10 NOTE — ED Notes (Signed)
Pt walked a full lap around the [facility] nurses station. Did not report any dizziness, lightheadedness, or pain. No assistance needed.

## 2021-09-10 NOTE — ED Triage Notes (Signed)
Pt arrived POV from home c/o dizziness x3 weeks that has just gradually gotten worse. Pt has seen here PCP who tried steroids and adjusting her thyroid medication and nothing is helping. Her PCP is suggesting a MRI.

## 2021-09-10 NOTE — Progress Notes (Signed)
   Subjective:    Patient ID: Carla Gonzalez, female    DOB: Apr 22, 1985, 36 y.o.   MRN: [id number]  HPI Seen for  recurrent vertigo. Unable to go to work today. Intermittent dizziness yesterday. Head feels full unable to think clearly  Has had left eye twitch for 3 weeks  Review of Systems head feels full and heavy. Depth perception seems off to her. Feels a little nauseated.     Objective:   Physical Exam VS reviewed. She has rightward nystagmus. Able to move all 4 extremities. Speech is normal. Looks fatigued. Gait is stable. No dysarthria. No facial weakness,       Assessment & Plan:  Persistent vertigo not responding to steroids and meclizine that were prescribed June 30th. Has seen Dr Jaynee Eagles in December 2022. Improved until recently when symptoms worsened. Has not had MRI of brain.  Plan: To ED for urgent evaluation. She could not go to work today and says she cannot think clearly. She has rightward nystagmus and need MRI of brain with contrast today.

## 2021-09-10 NOTE — Patient Instructions (Addendum)
Numerous labs drawn and results have proven to be normal.  She will try Sterapred DS 10 mg 6-day Dosepak.  Continue with meclizine for vertigo.  She will let me know if not improving.

## 2021-09-10 NOTE — ED Provider Notes (Signed)
[facility] EMERGENCY DEPARTMENT Provider Note   CSN: [id number] Arrival date & time: 09/10/21  1021     History  Chief Complaint  Patient presents with   Dizziness    Francee Setzer is a 36 y.o. female.  Patient presents with a 3-week history of "dizziness" that has been progressively worsening.  States she had an episode in December and saw neurology.  Her dizziness at that time was attributed to her inner ears.  However since approximately June 30 she has had progressively worsening symptoms with recurrent dizziness that is worse with any movement and has been constant for the past 48 hours.  She describes a "moving sensation" rather than a room spinning sensation.  Episode is similar to when she was seen in December but much more severe.  Has had nausea but no vomiting.  Denies any double vision or blurry vision but feels her "depth perception is off".  Denies any significant headache.  Denies any focal weakness, numbness or tingling.  Denies any difficulty speaking or difficulty swallowing.  No chest pain or shortness of breath. She saw her PCP last week was placed on a steroid taper which she has completed.  Reports no improvement with meclizine.  Saw PCP again today and was referred to the ED for MRI. Patient was unable to work seeing patients and felt like she could not walk because the whole room was moving.  Her TSH was elevated and her thyroid medication was adjusted a week ago as well. Denies any sudden onset headache.  Denies any difficulty speaking or difficulty swallowing.  Denies any recent URI symptoms or fevers.  Was told by her PCP that she had fluid behind her ears.  She has been trying nasal decongestants and Flonase as well without relief.  The history is provided by the patient.  Dizziness Associated symptoms: headaches, nausea and weakness   Associated symptoms: no chest pain, no shortness of breath and no vomiting        Home  Medications Prior to Admission medications   Medication Sig Start Date End Date Taking? Authorizing Provider  levothyroxine (SYNTHROID) 88 MCG tablet Take 1 tablet (88 mcg total) by mouth daily. 09/03/21   Elby Showers, MD  meclizine (ANTIVERT) 25 MG tablet Take 1 tablet (25 mg total) by mouth 3 (three) times daily as needed for dizziness. 02/02/21   Elby Showers, MD  norethindrone-ethinyl estradiol-FE (LOESTRIN FE 1/20) 1-20 MG-MCG tablet Take 1 tablet by mouth daily. 01/18/21     predniSONE (STERAPRED UNI-PAK 21 TAB) 10 MG (21) TBPK tablet Take in tapering course as directed 6-5-4-3-2-1 09/03/21   Elby Showers, MD      Allergies    Sulfa antibiotics    Review of Systems   Review of Systems  Constitutional:  Negative for activity change, appetite change and fever.  HENT:  Negative for congestion and rhinorrhea.   Eyes:  Positive for visual disturbance.  Respiratory:  Negative for cough, chest tightness and shortness of breath.   Cardiovascular:  Negative for chest pain.  Gastrointestinal:  Positive for nausea. Negative for abdominal pain and vomiting.  Genitourinary:  Negative for dysuria and hematuria.  Musculoskeletal:  Negative for arthralgias and myalgias.  Skin:  Negative for rash.  Neurological:  Positive for dizziness, weakness, light-headedness and headaches.   all other systems are negative except as noted in the HPI and PMH.    Physical Exam Updated Vital Signs BP (!) 154/94 (BP Location: Right  Arm)   Pulse 62   Temp 98.4 F (36.9 C) (Oral)   Resp 18   Ht '5\' 7"'$  (1.702 m)   Wt 90.7 kg   SpO2 97%   BMI 31.32 kg/m  Physical Exam Vitals and nursing note reviewed.  Constitutional:      General: She is not in acute distress.    Appearance: She is well-developed.  HENT:     Head: Normocephalic and atraumatic.     Ears:     Comments: Serous fluid behind TMs bilaterally    Mouth/Throat:     Pharynx: No oropharyngeal exudate.  Eyes:     Conjunctiva/sclera:  Conjunctivae normal.     Pupils: Pupils are equal, round, and reactive to light.  Neck:     Comments: No meningismus. Cardiovascular:     Rate and Rhythm: Normal rate and regular rhythm.     Heart sounds: Normal heart sounds. No murmur heard. Pulmonary:     Effort: Pulmonary effort is normal. No respiratory distress.     Breath sounds: Normal breath sounds.  Abdominal:     Palpations: Abdomen is soft.     Tenderness: There is no abdominal tenderness. There is no guarding or rebound.  Musculoskeletal:        General: No tenderness. Normal range of motion.     Cervical back: Normal range of motion and neck supple.  Skin:    General: Skin is warm.  Neurological:     Mental Status: She is alert and oriented to person, place, and time.     Cranial Nerves: No cranial nerve deficit.     Motor: No abnormal muscle tone.     Coordination: Coordination normal.     Comments: CN 2-12 intact, no ataxia on finger to nose, right-sided horizontal nystagmus, 5/5 strength throughout, no pronator drift,  Test of skew negative, head impulse negative    Psychiatric:        Behavior: Behavior normal.     ED Results / Procedures / Treatments   Labs (all labs ordered are listed, but only abnormal results are displayed) Labs Reviewed  CBC WITH DIFFERENTIAL/PLATELET - Abnormal; Notable for the following components:      Result Value   WBC 10.9 (*)    All other components within normal limits  BASIC METABOLIC PANEL - Abnormal; Notable for the following components:   Calcium 8.8 (*)    All other components within normal limits  TSH  I-STAT BETA HCG BLOOD, ED ([facility], [facility], [facility] ONLY)    EKG EKG Interpretation  Date/Time:  Friday September 10 2021 12:07:38 EDT Ventricular Rate:  58 PR Interval:  165 QRS Duration: 106 QT Interval:  428 QTC Calculation: 421 R Axis:   108 Text Interpretation: Sinus rhythm Borderline right axis deviation Low voltage, precordial leads No significant change was found  Confirmed by Ezequiel Essex [phone]) on 09/10/2021 12:14:07 PM  Radiology MR BRAIN WO CONTRAST  Result Date: 09/10/2021 CLINICAL DATA:  Dizziness, persistent/recurrent, cardiac or vascular cause suspected; Neuro deficit, acute, stroke suspected; Neuro deficit, acute, stroke suspected vertigo EXAM: MRI HEAD WITHOUT CONTRAST MRA HEAD WITHOUT CONTRAST MRA NECK WITHOUT CONTRAST TECHNIQUE: Multiplanar, multiecho pulse sequences of the brain and surrounding structures were obtained without intravenous contrast. Angiographic images of the Circle of Willis were obtained using MRA technique without intravenous contrast. Angiographic images of the neck were obtained using MRA technique without intravenous contrast. Carotid stenosis measurements (when applicable) are obtained utilizing NASCET criteria, using the distal internal carotid diameter  as the denominator. COMPARISON:  None Available. FINDINGS: MRI HEAD FINDINGS Brain: No acute infarction, hemorrhage, hydrocephalus, extra-axial collection or mass lesion. Vascular: Detailed below. Skull and upper cervical spine: Normal marrow signal. Sinuses/Orbits: Largely clear sinuses.  No acute orbital findings. Other: No mastoid effusions. MRA HEAD FINDINGS Anterior circulation: Bilateral intracranial ICAs, MCAs, and ACAs are patent without proximal hemodynamically significant stenosis. No aneurysm identified. Posterior circulation: Bilateral intradural vertebral arteries, basilar artery and posterior cerebral arteries are patent without proximal hemodynamically significant stenosis. No aneurysm identified. MRA NECK FINDINGS Carotid systems: The common carotid arteries and internal carotid is are patent bilaterally without evidence of significant (greater than 50%) stenosis. Vertebral arteries: Left dominant. Both vertebral arteries are patent without evidence of significant (greater than 50%) stenosis. IMPRESSION: 1. No evidence of acute intracranial abnormality. 2. No large  vessel occlusion or proximal hemodynamically significant stenosis in the head or neck. Electronically Signed   By: Margaretha Sheffield M.D.   On: 09/10/2021 14:22   MR Angiogram Neck W or Wo Contrast  Result Date: 09/10/2021 CLINICAL DATA:  Dizziness, persistent/recurrent, cardiac or vascular cause suspected; Neuro deficit, acute, stroke suspected; Neuro deficit, acute, stroke suspected vertigo EXAM: MRI HEAD WITHOUT CONTRAST MRA HEAD WITHOUT CONTRAST MRA NECK WITHOUT CONTRAST TECHNIQUE: Multiplanar, multiecho pulse sequences of the brain and surrounding structures were obtained without intravenous contrast. Angiographic images of the Circle of Willis were obtained using MRA technique without intravenous contrast. Angiographic images of the neck were obtained using MRA technique without intravenous contrast. Carotid stenosis measurements (when applicable) are obtained utilizing NASCET criteria, using the distal internal carotid diameter as the denominator. COMPARISON:  None Available. FINDINGS: MRI HEAD FINDINGS Brain: No acute infarction, hemorrhage, hydrocephalus, extra-axial collection or mass lesion. Vascular: Detailed below. Skull and upper cervical spine: Normal marrow signal. Sinuses/Orbits: Largely clear sinuses.  No acute orbital findings. Other: No mastoid effusions. MRA HEAD FINDINGS Anterior circulation: Bilateral intracranial ICAs, MCAs, and ACAs are patent without proximal hemodynamically significant stenosis. No aneurysm identified. Posterior circulation: Bilateral intradural vertebral arteries, basilar artery and posterior cerebral arteries are patent without proximal hemodynamically significant stenosis. No aneurysm identified. MRA NECK FINDINGS Carotid systems: The common carotid arteries and internal carotid is are patent bilaterally without evidence of significant (greater than 50%) stenosis. Vertebral arteries: Left dominant. Both vertebral arteries are patent without evidence of significant  (greater than 50%) stenosis. IMPRESSION: 1. No evidence of acute intracranial abnormality. 2. No large vessel occlusion or proximal hemodynamically significant stenosis in the head or neck. Electronically Signed   By: Margaretha Sheffield M.D.   On: 09/10/2021 14:22   MR ANGIO HEAD WO CONTRAST  Result Date: 09/10/2021 CLINICAL DATA:  Dizziness, persistent/recurrent, cardiac or vascular cause suspected; Neuro deficit, acute, stroke suspected; Neuro deficit, acute, stroke suspected vertigo EXAM: MRI HEAD WITHOUT CONTRAST MRA HEAD WITHOUT CONTRAST MRA NECK WITHOUT CONTRAST TECHNIQUE: Multiplanar, multiecho pulse sequences of the brain and surrounding structures were obtained without intravenous contrast. Angiographic images of the Circle of Willis were obtained using MRA technique without intravenous contrast. Angiographic images of the neck were obtained using MRA technique without intravenous contrast. Carotid stenosis measurements (when applicable) are obtained utilizing NASCET criteria, using the distal internal carotid diameter as the denominator. COMPARISON:  None Available. FINDINGS: MRI HEAD FINDINGS Brain: No acute infarction, hemorrhage, hydrocephalus, extra-axial collection or mass lesion. Vascular: Detailed below. Skull and upper cervical spine: Normal marrow signal. Sinuses/Orbits: Largely clear sinuses.  No acute orbital findings. Other: No mastoid effusions. MRA HEAD FINDINGS Anterior circulation: Bilateral  intracranial ICAs, MCAs, and ACAs are patent without proximal hemodynamically significant stenosis. No aneurysm identified. Posterior circulation: Bilateral intradural vertebral arteries, basilar artery and posterior cerebral arteries are patent without proximal hemodynamically significant stenosis. No aneurysm identified. MRA NECK FINDINGS Carotid systems: The common carotid arteries and internal carotid is are patent bilaterally without evidence of significant (greater than 50%) stenosis. Vertebral  arteries: Left dominant. Both vertebral arteries are patent without evidence of significant (greater than 50%) stenosis. IMPRESSION: 1. No evidence of acute intracranial abnormality. 2. No large vessel occlusion or proximal hemodynamically significant stenosis in the head or neck. Electronically Signed   By: Margaretha Sheffield M.D.   On: 09/10/2021 14:22    Procedures Procedures    Medications Ordered in ED Medications  lactated ringers bolus 1,000 mL (has no administration in time range)  ondansetron (ZOFRAN) injection 4 mg (has no administration in time range)  LORazepam (ATIVAN) injection 0.5 mg (has no administration in time range)    ED Course/ Medical Decision Making/ A&P                           Medical Decision Making Amount and/or Complexity of Data Reviewed Labs: ordered. Decision-making details documented in ED Course. Radiology: ordered and independent interpretation performed. Decision-making details documented in ED Course. ECG/medicine tests: ordered and independent interpretation performed. Decision-making details documented in ED Course.  Risk Prescription drug management.  Several weeks of movement sensation and likely vertigo.  Does have nystagmus on exam.  Sent by PCP for MRI.  Lab values are reassuring.  No anemia.  Creatinine is at baseline.  MRI negative for infarct.  No evidence of vertebral or basilar artery insufficiency or occlusion.  No aneurysms.  Patient feels improved after treatment in the ED including IV fluids, Ativan and meclizine.  She is able to tolerate p.o. and ambulate.  No evidence of acute posterior circulation CVA or vertebrobasilar insufficiency.  Suspect vertigo likely due to inner ear pathology.  Continue nasal steroids, antihistamines.  Will give additional course of steroids and meclizine.  Advised to use Ativan cautiously as it may cause dizziness and sedation.  Do not take it if working or driving.  Follow-up with PCP as well as ENT  may need vestibular rehab for ongoing vertiginous symptoms.  No evidence of stroke.  Return precautions discussed.        Final Clinical Impression(s) / ED Diagnoses Final diagnoses:  Vertigo    Rx / DC Orders ED Discharge Orders     None         Sasha Rogel, Annie Main, MD 09/10/21 [phone]

## 2021-09-10 NOTE — Patient Instructions (Signed)
To ED for rightward nystagmus and lethargy for urgent evaluation.

## 2021-09-14 ENCOUNTER — Encounter: Payer: Self-pay | Admitting: Internal Medicine

## 2021-09-14 ENCOUNTER — Ambulatory Visit (INDEPENDENT_AMBULATORY_CARE_PROVIDER_SITE_OTHER): Payer: No Typology Code available for payment source | Admitting: Internal Medicine

## 2021-09-14 VITALS — BP 138/88 | HR 72 | Temp 98.7°F

## 2021-09-14 DIAGNOSIS — R42 Dizziness and giddiness: Secondary | ICD-10-CM | POA: Diagnosis not present

## 2021-09-14 DIAGNOSIS — H5500 Unspecified nystagmus: Secondary | ICD-10-CM | POA: Diagnosis not present

## 2021-09-14 NOTE — Progress Notes (Unsigned)
   Subjective:    Patient ID: Carla Gonzalez, female    DOB: June 29, 1985, 36 y.o.   MRN: [id number]  HPI    Review of Systems     Objective:   Physical Exam        Assessment & Plan:

## 2021-09-14 NOTE — Progress Notes (Signed)
   Subjective:    Patient ID: Carla Gonzalez, female    DOB: Jul 07, 1985, 36 y.o.   MRN: [id number]  HPI 36 year old Female seen today for follow up after being seen in ED on July 7th    Review of Systems     Objective:   Physical Exam        Assessment & Plan:

## 2021-09-15 ENCOUNTER — Ambulatory Visit: Payer: No Typology Code available for payment source | Attending: Internal Medicine

## 2021-09-15 ENCOUNTER — Other Ambulatory Visit (HOSPITAL_COMMUNITY): Payer: Self-pay

## 2021-09-15 DIAGNOSIS — R2681 Unsteadiness on feet: Secondary | ICD-10-CM | POA: Diagnosis present

## 2021-09-15 DIAGNOSIS — R42 Dizziness and giddiness: Secondary | ICD-10-CM | POA: Diagnosis not present

## 2021-09-15 MED ORDER — VERAPAMIL HCL ER 180 MG PO TBCR
180.0000 mg | EXTENDED_RELEASE_TABLET | Freq: Every day | ORAL | 1 refills | Status: DC
Start: 2021-09-15 — End: 2022-02-01
  Filled 2021-09-15: qty 30, 30d supply, fill #0
  Filled 2021-10-10: qty 30, 30d supply, fill #1

## 2021-09-15 NOTE — Therapy (Signed)
OUTPATIENT PHYSICAL THERAPY VESTIBULAR EVALUATION     Patient Name: Carla Gonzalez MRN: [id number] DOB:[birth year], 36 y.o., female Today's Date: 09/15/2021  PCP: Elby Showers, MD REFERRING PROVIDER: Elby Showers, MD   PT End of Session - 09/15/21 0847     Visit Number 1    Number of Visits 17    Date for PT Re-Evaluation 11/12/21    Authorization Type [facility] (VL: 27)    PT Start Time 8182    PT Stop Time 0930    PT Time Calculation (min) 43 min    Activity Tolerance Patient tolerated treatment well    Behavior During Therapy WFL for tasks assessed/performed             Past Medical History:  Diagnosis Date   Hypothyroidism    Pectus excavatum    Renal cyst 2023   left; benign appearing   Past Surgical History:  Procedure Laterality Date   APPENDECTOMY     CESAREAN SECTION N/A 08/12/2019   Procedure: CESAREAN SECTION;  Surgeon: Jerelyn Charles, MD;  Location: [facility] LD ORS;  Service: Obstetrics;  Laterality: N/A;   Grommet     TONSILLECTOMY AND ADENOIDECTOMY  2009   Patient Active Problem List   Diagnosis Date Noted   Symptomatic bradycardia 08/18/2019   Dilated pulmonary artery 08/18/2019   Cesarean delivery due to previous difficult delivery, delivered, current hospitalization 08/12/2019   Fetal malpresentation, delivered, current hospitalization 08/12/2019   Fragile x chromosome 04/04/2016    ONSET DATE: 09/14/21  REFERRING DIAG: R42 (ICD-10-CM) - Dizziness and giddiness  THERAPY DIAG:  Dizziness and giddiness  Unsteadiness on feet  Rationale for Evaluation and Treatment Rehabilitation  SUBJECTIVE:   SUBJECTIVE STATEMENT: Pt presented to ED on 09/10/21 with reports of 3 week history of dizziness that progressively worsened. Prior History of Dizziness in past December. Patient reports she saw Dr. Jaynee Eagles in December, she received steroids believed it was a labyrinthitis. Has not been sick this go around when this dizziness started. thyroid was  abnormal. But reports over the last week has been so dizzy she has not been able to work. Has tried steroids and meclizine. Has appt with ENT at 2 pm today. Patient reports she has nystagmus with looking to the R side, most notable in the past week. Has nauseous sensation, no vomiting. Denies tinnitus, but does have fullness in the ears. Eyes feel out of focus, but does not have diplopia. Has not noticed any hearing loss. Has some pain behind the R ear > L ear. Patient reports the dizziness is staying around all day, reports it can increase with movement but sometimes feel better when moving. Denies recent sickness, concussion, or travel. Reports history of motion sickness. Reports balance has been getting worse.   Pt accompanied by:  family member in the lobby  PERTINENT HISTORY:  Hypothyroidism   PAIN:  Are you having pain?  Some mild pain behind the R ear.   PRECAUTIONS: None  WEIGHT BEARING RESTRICTIONS No  FALLS: Has patient fallen in last 6 months? 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  COGNITION: Overall cognitive status: Within functional limits for tasks assessed   SENSATION: WFL  POSTURE: No Significant postural limitations  Cervical ROM: WFL  STRENGTH:  WFL   TRANSFERS: Assistive device utilized: None  Sit to stand: Complete Independence Stand to sit: Complete Independence  GAIT: Gait pattern: WFL Assistive device utilized: None Level of assistance: Complete Independence  FUNCTIONAL TESTs:  SOT TBA at Next Session  PATIENT SURVEYS:  FOTO DPS: 47 and DFS: 40.6      SYMPTOM BEHAVIOR:   Subjective history: See Subjective   Non-Vestibular symptoms: changes in vision, nausea/vomiting, and  Aural Fullness   Type of dizziness: Imbalance (Disequilibrium), Unsteady with head/body turns, and "World moves"   Frequency: Daily   Duration: Constant; Increases Intensity   Aggravating factors: Induced by motion: turning head quickly and Occurs when standing still    Relieving factors: lying supine   Progression of symptoms: unchanged   OCULOMOTOR EXAM:   Ocular Alignment: normal   Ocular ROM: No Limitations   Spontaneous Nystagmus: absent   Gaze-Induced Nystagmus: absent   Smooth Pursuits: intact; some dizziness with gaze to the R   Saccades: intact; no dizziness   Convergence/Divergence: 5 cm    VESTIBULAR - OCULAR REFLEX:    Slow VOR: Normal;    VOR Cancellation: Normal   Head-Impulse Test: HIT Right: negative HIT Left: negative   Dynamic Visual Acuity: Static: 11 Dynamic: 9    POSITIONAL TESTING: Right Dix-Hallpike: no nystagmus Left Dix-Hallpike: no nystagmus    MOTION SENSITIVITY:    Motion Sensitivity Quotient  Intensity: 0 = none, 1 = Lightheaded, 2 = Mild, 3 = Moderate, 4 = Severe, 5 = Vomiting  Intensity  1. Sitting to supine 0 (reports better than baseline)  2. Supine to L side 0  3. Supine to R side 0  4. Supine to sitting 2  5. L Hallpike-Dix 0  6. Up from L  2  7. R Hallpike-Dix 0  8. Up from R  2  9. Sitting, head  tipped to L knee 0  10. Head up from L  knee 0  11. Sitting, head  tipped to R knee 0  12. Head up from R  knee 0  13. Sitting head turns x5 3  14.Sitting head nods x5 2  15. In stance, 180  turn to L  2  16. In stance, 180  turn to R 1      PATIENT EDUCATION: Education details: Educated on [employer] Person educated: Patient Education method: Explanation Education comprehension: verbalized understanding   GOALS: Goals reviewed with patient? Yes  SHORT TERM GOALS = LONG TERM GOALS   LONG TERM GOALS: Target date: 11/12/2021    Pt will be independent with final HEP for improved vestibular input/balance   Baseline: no HEP established Goal status: INITIAL  2.  Pt will report </= 1/5 for all movements on MSQ to indicate improvement in motion sensitivity and improved activity tolerance.  Baseline: 2-3/5 Goal status: INITIAL  3.  LTG to be set for SOT Baseline: TBA Goal status: INITIAL  4.  Pt will improve DPS to >/= 62 Baseline: 47 Goal status: INITIAL  ASSESSMENT:  CLINICAL IMPRESSION: Patient is a 36 y.o. female referred to Neuro OPPT services for Dizziness. Patient's PMH significant for the following: Hypothyroidism . Upon evaluation, patient presents with the following impairments: normal oculomotor exam, mild postural sway and dysequilibrium, dizziness, increased motion sensitivity to head/body movement. Patient will plan to follow up with ENT to further assess peripheral causes of dizziness. No indication of central involvement upon evaluation. Patient will benefit from skilled PT services to address impairments.  OBJECTIVE IMPAIRMENTS decreased activity tolerance, decreased balance, and dizziness.   ACTIVITY LIMITATIONS lifting, bending, and caring for others  PARTICIPATION LIMITATIONS: driving, community activity, and occupation  PERSONAL FACTORS 1 comorbidity: Hypothyroidism  are also affecting patient's functional outcome.   REHAB POTENTIAL: Excellent  CLINICAL DECISION MAKING: Stable/uncomplicated  EVALUATION COMPLEXITY: Low   PLAN: PT FREQUENCY: 2x/week  PT DURATION: 8 weeks;   PLANNED INTERVENTIONS: Therapeutic exercises, Therapeutic activity, Neuromuscular re-education, Balance training, Gait training, Patient/Family education, Joint manipulation, Joint mobilization, Stair training, Vestibular training, Canalith repositioning, Aquatic Therapy, Dry Needling, Cryotherapy, Moist heat, Manual therapy, and Re-evaluation  PLAN FOR NEXT SESSION: Update from ENT. Establish VOR/Motion Sensitivity HEP. Complete SOT.    Kirtland Bouchard, PT, DPT 09/15/2021, 9:50 AM

## 2021-09-16 ENCOUNTER — Other Ambulatory Visit (HOSPITAL_COMMUNITY): Payer: Self-pay

## 2021-09-16 MED ORDER — LOW-OGESTREL 0.3-30 MG-MCG PO TABS
1.0000 | ORAL_TABLET | Freq: Every day | ORAL | 4 refills | Status: DC
  Filled 2021-09-16 – 2021-10-02 (×2): qty 84, 84d supply, fill #0
  Filled 2021-12-21: qty 84, 84d supply, fill #1
  Filled 2022-03-01: qty 84, 84d supply, fill #2

## 2021-09-23 ENCOUNTER — Other Ambulatory Visit (HOSPITAL_COMMUNITY): Payer: Self-pay

## 2021-09-24 ENCOUNTER — Other Ambulatory Visit (HOSPITAL_COMMUNITY): Payer: Self-pay

## 2021-09-28 ENCOUNTER — Other Ambulatory Visit (HOSPITAL_COMMUNITY): Payer: Self-pay

## 2021-09-28 MED ORDER — VERAPAMIL HCL ER 180 MG PO TBCR
180.0000 mg | EXTENDED_RELEASE_TABLET | Freq: Every day | ORAL | 1 refills | Status: DC
  Filled 2021-09-28 – 2021-11-08 (×3): qty 90, 90d supply, fill #0

## 2021-10-02 ENCOUNTER — Other Ambulatory Visit (HOSPITAL_COMMUNITY): Payer: Self-pay

## 2021-10-04 ENCOUNTER — Other Ambulatory Visit (HOSPITAL_COMMUNITY): Payer: Self-pay

## 2021-10-11 ENCOUNTER — Other Ambulatory Visit (HOSPITAL_COMMUNITY): Payer: Self-pay

## 2021-11-09 ENCOUNTER — Other Ambulatory Visit (HOSPITAL_COMMUNITY): Payer: Self-pay

## 2021-11-19 ENCOUNTER — Other Ambulatory Visit (HOSPITAL_COMMUNITY): Payer: Self-pay

## 2021-11-19 MED ORDER — LOW-OGESTREL 0.3-30 MG-MCG PO TABS
1.0000 | ORAL_TABLET | Freq: Every day | ORAL | 4 refills | Status: DC
  Filled 2021-11-19: qty 84, 84d supply, fill #0

## 2021-11-24 ENCOUNTER — Other Ambulatory Visit (HOSPITAL_COMMUNITY): Payer: Self-pay

## 2021-11-25 ENCOUNTER — Telehealth: Payer: Self-pay | Admitting: Internal Medicine

## 2021-11-25 NOTE — Telephone Encounter (Signed)
Faxed signed authorization to change brand name for below medication.  levothyroxine (SYNTHROID) 88 MCG tablet   [place] Phone: [phone]  Fax: [phone]

## 2021-11-25 NOTE — Telephone Encounter (Signed)
This message was sent via [employer], a product from [employer] [email or web address]                    -------Fax Transmission Report-------  To:               Recipient at [phone] Subject:          FW: Hp Scans Result:           The transmission was successful. Explanation:      All Pages Ok Pages Sent:       2 Connect Time:     2 minutes, 13 seconds Transmit Time:    11/25/2021 10:22 Transfer Rate:    14400 Status Code:      0000 Retry Count:      0 Job Id:           [id number] Unique Id:        [id number] Fax Line:         36 Fax Server:       [employer]

## 2021-12-21 ENCOUNTER — Other Ambulatory Visit (HOSPITAL_COMMUNITY): Payer: Self-pay

## 2022-01-10 ENCOUNTER — Ambulatory Visit (INDEPENDENT_AMBULATORY_CARE_PROVIDER_SITE_OTHER): Payer: No Typology Code available for payment source | Admitting: Neurology

## 2022-01-10 ENCOUNTER — Encounter: Payer: Self-pay | Admitting: Neurology

## 2022-01-10 ENCOUNTER — Other Ambulatory Visit (HOSPITAL_COMMUNITY): Payer: Self-pay

## 2022-01-10 ENCOUNTER — Telehealth: Payer: Self-pay | Admitting: *Deleted

## 2022-01-10 VITALS — BP 118/74 | HR 81 | Ht 67.0 in | Wt 205.2 lb

## 2022-01-10 DIAGNOSIS — G43709 Chronic migraine without aura, not intractable, without status migrainosus: Secondary | ICD-10-CM

## 2022-01-10 MED ORDER — AJOVY 225 MG/1.5ML ~~LOC~~ SOAJ
225.0000 mg | SUBCUTANEOUS | 11 refills | Status: DC
  Filled 2022-01-10: qty 1.5, 28d supply, fill #0

## 2022-01-10 MED ORDER — RIZATRIPTAN BENZOATE 10 MG PO TBDP
10.0000 mg | ORAL_TABLET | ORAL | 11 refills | Status: DC | PRN
  Filled 2022-01-10: qty 1, 3d supply, fill #0
  Filled 2022-01-10: qty 8, 27d supply, fill #0

## 2022-01-10 MED ORDER — NURTEC 75 MG PO TBDP: 75.0000 mg | ORAL_TABLET | Freq: Every day | ORAL | 0 refills | Status: DC | PRN

## 2022-01-10 MED ORDER — ONDANSETRON 4 MG PO TBDP
4.0000 mg | ORAL_TABLET | Freq: Three times a day (TID) | ORAL | 3 refills | Status: DC | PRN
  Filled 2022-01-10: qty 30, 5d supply, fill #0

## 2022-01-10 NOTE — Telephone Encounter (Signed)
Lanette Hampshire PA Key: [phone] on Cover My Meds. Preferred agents are Aimovig, Emg[employer], Nurtec, and Qulipta. Pt must have previous failure or contraindication to these.

## 2022-01-10 NOTE — Progress Notes (Addendum)
[id number]    Provider:  Dr Jaynee Eagles Requesting Provider: [name], MD Primary Care Provider:  Elby Showers, MD  CC:  dizziness  04/25/2022 update: doing great! Now have <6 migraine days a month and < 8 total headache days a month failed imitrex and maxalt will order nurtec.   01/10/2022: She is still having migraines, with vertigo, she tried to treat her vestibular symptoms as sinus congestion and eustacian tube, did not work, worse with her cycles, by conservative measures, she has been vestibular therapy and done Epley maneuvers, she still has migrainous headaches, they can be unilateral or holocephalic, pulsating pounding throbbing, photophobia phonophobia, nausea, hurts to move, they can last 40 to 72 hours and be moderate to severe, it is affecting her life and her job, no medication overuse, she has vertigo which is associated with the migraines but I do not believe that this is an aura.  We have tried to treat her as sinus congestion, she has done Nettie pot, decongestions, Epley maneuvers, therapy, meclizine, verapamil,.  At this point we need to treat her for migraines with vertigo. The vertigo comes with the sever migraines, it is does not proceed it or is not a harbinger of her migraines which why I do not think this is an aura, but we did talk about migraine with aura and risk of stroke. She has at least for the last 6 months has > 8 mod to severe migraine days a month and > 15 total headache days a month.No other focal neurologic deficits, associated symptoms, inciting events or modifiable factors.  Medication tried include: Verapamil(propranolol contraindicated due to hypotensiom), Topamax, amitriptyline. Sumatriptan. Rizatroiptan.   Personally reviewed images and agree:  CLINICAL DATA:  Dizziness, persistent/recurrent, cardiac or vascular cause suspected; Neuro deficit, acute, stroke suspected; Neuro deficit, acute, stroke suspected vertigo   EXAM: MRI  HEAD WITHOUT CONTRAST   MRA HEAD WITHOUT CONTRAST   MRA NECK WITHOUT CONTRAST   TECHNIQUE: Multiplanar, multiecho pulse sequences of the brain and surrounding structures were obtained without intravenous contrast. Angiographic images of the Circle of Willis were obtained using MRA technique without intravenous contrast. Angiographic images of the neck were obtained using MRA technique without intravenous contrast. Carotid stenosis measurements (when applicable) are obtained utilizing NASCET criteria, using the distal internal carotid diameter as the denominator.   COMPARISON:  None Available.   FINDINGS: MRI HEAD FINDINGS   Brain: No acute infarction, hemorrhage, hydrocephalus, extra-axial collection or mass lesion.   Vascular: Detailed below.   Skull and upper cervical spine: Normal marrow signal.   Sinuses/Orbits: Largely clear sinuses.  No acute orbital findings.   Other: No mastoid effusions.   MRA HEAD FINDINGS   Anterior circulation: Bilateral intracranial ICAs, MCAs, and ACAs are patent without proximal hemodynamically significant stenosis. No aneurysm identified.   Posterior circulation: Bilateral intradural vertebral arteries, basilar artery and posterior cerebral arteries are patent without proximal hemodynamically significant stenosis. No aneurysm identified.   MRA NECK FINDINGS   Carotid systems: The common carotid arteries and internal carotid is are patent bilaterally without evidence of significant (greater than 50%) stenosis.   Vertebral arteries: Left dominant. Both vertebral arteries are patent without evidence of significant (greater than 50%) stenosis.   IMPRESSION: 1. No evidence of acute intracranial abnormality. 2. No large vessel occlusion or proximal hemodynamically significant stenosis in the head or neck.  Patient complains of symptoms per HPI as well as the following symptoms: migraine . Pertinent negatives and positives per HPI.  All others negative   HPI:  Carla Gonzalez is a 36 y.o. female here as requested by Elby Showers, MD for vertigo. PMHx fragile X chromosome, dilated pulmonary artery, symptomatic bradycardia.  I reviewed Dr. 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  She has rightward nystagmus probably BPPV which may be aggravated by stress, comes on without warning and alarming to her makes her feel a bit out of control, she has a stressful job, she does not have tinnitus with these episodes, they also gave her Antivert.   She had her youngest daughter in June, she had a lot of problems with bradycardia and flushing and palpitations, a few months later she would get dizzy standing in the kitchen, she was diagnosed with hypothyroidism, dizziness was better on synthroid, it was bothering her at work, she felt imbalanced, the room would feel like it would get narrow and dark, 2 months ago her dizziness came back, just the dizziness, dizziness if she turns her head too much or even with sitting, meclizine gave her side effects, she feels like she is on a ride, she feels she is moving but the room is not, movement when there isn't. A few episodes she felt lightheaded and her vision narrowed. Her kids are sick constantly. She has been on antibitotics. She went to the eye doctor. No headaches. Decreased hearing on the right side like in a tunnel. No other focal neurologic deficits, associated symptoms, inciting events or modifiable factors.  Medications tried; Verapamil,   Reviewed notes, labs and imaging from outside  physicians, which showed: see above  Review of Systems: Patient complains of symptoms per HPI as well as the following symptoms dizziness. Pertinent negatives and positives per HPI. All others negative.   Social History   Socioeconomic History   Marital status: Married    Spouse name: Not on file   Number of children: Not on file   Years of education: Not on file   Highest education level: Not on file  Occupational History   Not on file  Tobacco Use   Smoking status: Never   Smokeless tobacco: Never  Vaping Use   Vaping Use: Never used  Substance and Sexual Activity   Alcohol use: Not Currently   Drug use: Never   Sexual activity: Yes    Birth control/protection: Pill  Other Topics Concern   Not on file  Social History Narrative   Works as cardiology NP.  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    Social Determinants of Health   Financial Resource Strain: Not on file  Food Insecurity: Not on file  Transportation Needs: Not on file  Physical Activity: Not on file  Stress: Not on file  Social Connections: Not on file  Intimate Partner Violence: Not on file    Family History  Problem Relation Age of Onset   Hypertension Father    Melanoma Father    Migraines Neg Hx    Colon cancer Neg Hx    Stomach cancer Neg Hx    Headache Neg Hx     Past Medical History:  Diagnosis Date   Hypothyroidism    Pectus excavatum  Renal cyst 2023   left; benign appearing    Patient Active Problem List   Diagnosis Date Noted   Chronic migraine without aura without status migrainosus, not intractable 01/10/2022   Symptomatic bradycardia 08/18/2019   Dilated pulmonary artery 08/18/2019   Cesarean delivery due to previous difficult delivery, delivered, current hospitalization 08/12/2019   Fetal malpresentation, delivered, current hospitalization 08/12/2019   Fragile x chromosome 04/04/2016    Past Surgical History:  Procedure Laterality Date   APPENDECTOMY     CESAREAN SECTION N/A 08/12/2019    Procedure: CESAREAN SECTION;  Surgeon: Jerelyn Charles, MD;  Location: [facility];  Service: Obstetrics;  Laterality: N/A;   Grommet     TONSILLECTOMY AND ADENOIDECTOMY  2009    Current Outpatient Medications  Medication Sig Dispense Refill   Fremanezumab-vfrm (AJOVY) 225 MG/1.5ML SOAJ Inject 225 mg into the skin every 30 (thirty) days. 1.5 mL 11   levothyroxine (SYNTHROID) 88 MCG tablet Take 1 tablet (88 mcg total) by mouth daily. 90 tablet 3   norgestrel-ethinyl estradiol (LOW-OGESTREL) 0.3-30 MG-MCG tablet Take 1 tablet by mouth daily. 84 tablet 4   ondansetron (ZOFRAN-ODT) 4 MG disintegrating tablet Take 1-2 tablets (4-8 mg total) by mouth every 8 (eight) hours as needed. 30 tablet 3   Rimegepant Sulfate (NURTEC) 75 MG TBDP Take 75 mg by mouth daily as needed. For migraines. Take as close to onset of migraine as possible. One daily maximum. 6 tablet 0   rizatriptan (MAXALT-MLT) 10 MG disintegrating tablet Take 1 tablet (10 mg total) by mouth as needed for migraine. May repeat in 2 hours if needed 9 tablet 11   verapamil (CALAN-SR) 180 MG CR tablet Take 1 tablet (180 mg total) by mouth daily. 90 tablet 1   LORazepam (ATIVAN) 0.5 MG tablet Take 2 tablets (1 mg total) by mouth 3 (three) times daily as needed (vertigo). 10 tablet 0   meclizine (ANTIVERT) 25 MG tablet Take 1 tablet (25 mg total) by mouth 3 (three) times daily as needed for dizziness. 30 tablet 0   norethindrone-ethinyl estradiol-FE (LOESTRIN FE 1/20) 1-20 MG-MCG tablet Take 1 tablet by mouth daily. 84 tablet 4   norgestrel-ethinyl estradiol (LOW-OGESTREL) 0.3-30 MG-MCG tablet Take 1 tablet by mouth daily. 84 tablet 4   ondansetron (ZOFRAN-ODT) 4 MG disintegrating tablet Take 1 tablet (4 mg total) by mouth every 8 (eight) hours as needed for nausea or vomiting. 20 tablet 0   predniSONE (DELTASONE) 50 MG tablet Take 1 tablet (50 mg total) by mouth daily. 5 tablet 0   verapamil (CALAN-SR) 180 MG CR tablet Take 1 tablet (180 mg total) by  mouth daily. 30 tablet 1   No current facility-administered medications for this visit.    Allergies as of 01/10/2022 - Review Complete 01/10/2022  Allergen Reaction Noted   Sulfa antibiotics Hives 09/01/2011    Vitals: BP 118/74   Pulse 81   Ht 5' 7"$  (1.702 m)   Wt 205 lb 3.2 oz (93.1 kg)   BMI 32.14 kg/m  Last Weight:  Wt Readings from Last 1 Encounters:  01/10/22 205 lb 3.2 oz (93.1 kg)   Last Height:   Ht Readings from Last 1 Encounters:  01/10/22 5' 7"$  (1.702 m)   Physical exam: Exam: Gen: NAD, conversant, well nourised, obese, well groomed                     CV: RRR, no MRG. No Carotid Bruits. No peripheral edema, warm, nontender Eyes: Conjunctivae  clear without exudates or hemorrhage  Neuro: Detailed Neurologic Exam  Speech:    Speech is normal; fluent and spontaneous with normal comprehension.  Cognition:    The patient is oriented to person, place, and time;     recent and remote memory intact;     language fluent;     normal attention, concentration,     fund of knowledge Cranial Nerves:    The pupils are equal, round, and reactive to light. The fundi are normal and spontaneous venous pulsations are present. Visual fields are full to finger confrontation. Extraocular movements are intact. Trigeminal sensation is intact and the muscles of mastication are normal. The face is symmetric. The palate elevates in the midline. Hearing intact. Voice is normal. Shoulder shrug is normal. The tongue has normal motion without fasciculations.   Coordination:    Normal finger to nose and heel to shin. Normal rapid alternating movements.   Gait:    Heel-toe and tandem gait are normal.   Motor Observation:    No asymmetry, no atrophy, and no involuntary movements noted. Tone:    Normal muscle tone.    Posture:    Posture is normal. normal erect    Strength:    Strength is V/V in the upper and lower limbs.      Sensation: intact to LT     Reflex  Exam:  DTR's:    Deep tendon reflexes in the upper and lower extremities are normal bilaterally.   Toes:    The toes are downgoing bilaterally.   Clonus:    Clonus is absent.    Assessment/Plan:  [age] year old patient with chronic migraines with vertigo.   Preventative: Ajovy  Acute: Maxalt and Ondansetron She is not having any children, husband has had children and uses backup.   Meds ordered this encounter  Medications   rizatriptan (MAXALT-MLT) 10 MG disintegrating tablet    Sig: Take 1 tablet (10 mg total) by mouth as needed for migraine. May repeat in 2 hours if needed    Dispense:  9 tablet    Refill:  11   ondansetron (ZOFRAN-ODT) 4 MG disintegrating tablet    Sig: Take 1-2 tablets (4-8 mg total) by mouth every 8 (eight) hours as needed.    Dispense:  30 tablet    Refill:  3   Fremanezumab-vfrm (AJOVY) 225 MG/1.5ML SOAJ    Sig: Inject 225 mg into the skin every 30 (thirty) days.    Dispense:  1.5 mL    Refill:  11    Patient has copay card; she can have medication regardless of insurance approval or copay amount.   Rimegepant Sulfate (NURTEC) 75 MG TBDP    Sig: Take 75 mg by mouth daily as needed. For migraines. Take as close to onset of migraine as possible. One daily maximum.    Dispense:  6 tablet    Refill:  0    Modalities tried, not helpful:  Medication tried include: Verapamil(propranolol contraindicated due to hypotensiom), Topamax, amitriptyline. Sumatriptan.   Medrol dosepak :Inner ear inflammation/fluid behind right ear: Kids have bee sick, maybe viral. She has been on antibiotics. She reports pressure behind ear. Try medrol dosepak OTC Flonase or other nasal steroidal spray daily for at least 4 weeks: may help with inner ear fluid and inflammation along with the medrol dosepak Netti Pot with distilled water daily: watched online video can help with congestion and sinus issues Decongestant ZyrtecD for example for several weeks Consider MRI brain  if  no resolution with above Vestibular rehab for Epley Maneuver teaching but I don;t think this is crystals dislodged(IE BPPV), will order in case above does not resolve Consider ENT/Allergy. She has some fluid behind the ears. Consider ear tubes? She had them as a child, may consider as last resort  No orders of the defined types were placed in this encounter.  Meds ordered this encounter  Medications   rizatriptan (MAXALT-MLT) 10 MG disintegrating tablet    Sig: Take 1 tablet (10 mg total) by mouth as needed for migraine. May repeat in 2 hours if needed    Dispense:  9 tablet    Refill:  11   ondansetron (ZOFRAN-ODT) 4 MG disintegrating tablet    Sig: Take 1-2 tablets (4-8 mg total) by mouth every 8 (eight) hours as needed.    Dispense:  30 tablet    Refill:  3   Fremanezumab-vfrm (AJOVY) 225 MG/1.5ML SOAJ    Sig: Inject 225 mg into the skin every 30 (thirty) days.    Dispense:  1.5 mL    Refill:  11    Patient has copay card; she can have medication regardless of insurance approval or copay amount.   Rimegepant Sulfate (NURTEC) 75 MG TBDP    Sig: Take 75 mg by mouth daily as needed. For migraines. Take as close to onset of migraine as possible. One daily maximum.    Dispense:  6 tablet    Refill:  0    Cc: Baxley, Cresenciano Lick, MD,  Baxley, Cresenciano Lick, MD  Sarina Ill, MD  [facility] [address] [place], [place] [address]  Phone [phone] Fax [phone]  I spent over 45 minutes of face-to-face and non-face-to-face time with patient on the  1. Chronic migraine without aura without status migrainosus, not intractable     diagnosis.  This included previsit chart review, lab review, study review, order entry, electronic health record documentation, patient education on the different diagnostic and therapeutic options, counseling and coordination of care, risks and benefits of management, compliance, or risk factor reduction

## 2022-01-10 NOTE — Patient Instructions (Addendum)
Start Ajovy monthly - preventative Try Nurtec - samples Rizatriptan and Ondansetron at onset and then repeat in 2 hours   There is increased risk for stroke in women with migraine with aura and a contraindication for the combined contraceptive pill for use by women who have migraine with aura. The risk for women with migraine without aura is lower. However other risk factors like smoking are far more likely to increase stroke risk than migraine. There is a recommendation for no smoking and for the use of OCPs without estrogen such as progestogen only pills particularly for women with migraine with aura.Marland Kitchen People who have migraine headaches with auras may be 3 times more likely to have a stroke caused by a blood clot, compared to migraine patients who don't see auras. Women who take hormone-replacement therapy may be 30 percent more likely to suffer a clot-based stroke than women not taking medication containing estrogen. Other risk factors like smoking and high blood pressure and diabetes may be  much more important.  Rimegepant Disintegrating Tablets What is this medication? RIMEGEPANT (ri ME je pant) prevents and treats migraines. It works by blocking a substance in the body that causes migraines. This medicine may be used for other purposes; ask your health care provider or pharmacist if you have questions. COMMON BRAND NAME(S): NURTEC ODT What should I tell my care team before I take this medication? They need to know if you have any of these conditions: Kidney disease Liver disease An unusual or allergic reaction to rimegepant, other medications, foods, dyes, or preservatives Pregnant or trying to get pregnant Breast-feeding How should I use this medication? Take this medication by mouth. Take it as directed on the prescription label. Leave the tablet in the sealed pack until you are ready to take it. With dry hands, open the pack and gently remove the tablet. If the tablet breaks or  crumbles, throw it away. Use a new tablet. Place the tablet in the mouth and allow it to dissolve. Then, swallow it. Do not cut, crush, or chew this medication. You do not need water to take this medication. Talk to your care team about the use of this medication in children. Special care may be needed. Overdosage: If you think you have taken too much of this medicine contact a poison control center or emergency room at once. NOTE: This medicine is only for you. Do not share this medicine with others. What if I miss a dose? This does not apply. This medication is not for regular use. What may interact with this medication? Certain medications for fungal infections, such as fluconazole, itraconazole Rifampin This list may not describe all possible interactions. Give your health care provider a list of all the medicines, herbs, non-prescription drugs, or dietary supplements you use. Also tell them if you smoke, drink alcohol, or use illegal drugs. Some items may interact with your medicine. What should I watch for while using this medication? Visit your care team for regular checks on your progress. Tell your care team if your symptoms do not start to get better or if they get worse. What side effects may I notice from receiving this medication? Side effects that you should report to your care team as soon as possible: Allergic reactions--skin rash, itching, hives, swelling of the face, lips, tongue, or throat Side effects that usually do not require medical attention (report to your care team if they continue or are bothersome): Nausea Stomach pain This list may not describe all possible  side effects. Call your doctor for medical advice about side effects. You may report side effects to FDA at 1-800-FDA-1088. Where should I keep my medication? Keep out of the reach of children and pets. Store at room temperature between 20 and 25 degrees C (68 and 77 degrees F). Get rid of any unused medication  after the expiration date. To get rid of medications that are no longer needed or have expired: Take the medication to a medication take-back program. Check with your pharmacy or law enforcement to find a location. If you cannot return the medication, check the label or package insert to see if the medication should be thrown out in the garbage or flushed down the toilet. If you are not sure, ask your care team. If it is safe to put it in the trash, take the medication out of the container. Mix the medication with cat litter, dirt, coffee grounds, or other unwanted substance. Seal the mixture in a bag or container. Put it in the trash. NOTE: This sheet is a summary. It may not cover all possible information. If you have questions about this medicine, talk to your doctor, pharmacist, or health care provider.  2023 Elsevier/Gold Standard (2021-03-11 00:00:00) Ondansetron Dissolving Tablets What is this medication? ONDANSETRON (on DAN se tron) prevents nausea and vomiting from chemotherapy, radiation, or surgery. It works by blocking substances in the body that may cause nausea or vomiting. It belongs to a group of medications called antiemetics. This medicine may be used for other purposes; ask your health care provider or pharmacist if you have questions. COMMON BRAND NAME(S): Zofran ODT What should I tell my care team before I take this medication? They need to know if you have any of these conditions: Heart disease History of irregular heartbeat Liver disease Low levels of magnesium or potassium in the blood An unusual or allergic reaction to ondansetron, granisetron, other medications, foods, dyes, or preservatives Pregnant or trying to get pregnant Breast-feeding How should I use this medication? These tablets are made to dissolve in the mouth. Do not try to push the tablet through the foil backing. With dry hands, peel away the foil backing and gently remove the tablet. Place the tablet in  the mouth and allow it to dissolve, then swallow. While you may take these tablets with water, it is not necessary to do so. Talk to your care team regarding the use of this medication in children. Special care may be needed. Overdosage: If you think you have taken too much of this medicine contact a poison control center or emergency room at once. NOTE: This medicine is only for you. Do not share this medicine with others. What if I miss a dose? If you miss a dose, take it as soon as you can. If it is almost time for your next dose, take only that dose. Do not take double or extra doses. What may interact with this medication? Do not take this medication with any of the following: Apomorphine Certain medications for fungal infections like fluconazole, itraconazole, ketoconazole, posaconazole, voriconazole Cisapride Dronedarone Pimozide Thioridazine This medication may also interact with the following: Carbamazepine Certain medications for depression, anxiety, or psychotic disturbances Fentanyl Linezolid MAOIs like Carbex, Eldepryl, Marplan, Nardil, and Parnate Methylene blue (injected into a vein) Other medications that prolong the QT interval (cause an abnormal heart rhythm) like dofetilide, ziprasidone Phenytoin Rifampicin Tramadol This list may not describe all possible interactions. Give your health care provider a list of all the medicines,  herbs, non-prescription drugs, or dietary supplements you use. Also tell them if you smoke, drink alcohol, or use illegal drugs. Some items may interact with your medicine. What should I watch for while using this medication? Check with your care team as soon as you can if you have any sign of an allergic reaction. What side effects may I notice from receiving this medication? Side effects that you should report to your care team as soon as possible: Allergic reactions--skin rash, itching, hives, swelling of the face, lips, tongue, or  throat Bowel blockage--stomach cramping, unable to have a bowel movement or pass gas, loss of appetite, vomiting Chest pain (angina)--pain, pressure, or tightness in the chest, neck, back, or arms Heart rhythm changes--fast or irregular heartbeat, dizziness, feeling faint or lightheaded, chest pain, trouble breathing Irritability, confusion, fast or irregular heartbeat, muscle stiffness, twitching muscles, sweating, high fever, seizure, chills, vomiting, diarrhea, which may be signs of serotonin syndrome Side effects that usually do not require medical attention (report to your care team if they continue or are bothersome): Constipation Diarrhea General discomfort and fatigue Headache This list may not describe all possible side effects. Call your doctor for medical advice about side effects. You may report side effects to FDA at 1-800-FDA-1088. Where should I keep my medication? Keep out of the reach of children and pets. Store between 2 and 30 degrees C (36 and 86 degrees F). Throw away any unused medication after the expiration date. NOTE: This sheet is a summary. It may not cover all possible information. If you have questions about this medicine, talk to your doctor, pharmacist, or health care provider.  2023 Elsevier/Gold Standard (2007-04-14 00:00:00) Rizatriptan Disintegrating Tablets What is this medication? RIZATRIPTAN (rye za TRIP tan) treats migraines. It works by blocking pain signals and narrowing blood vessels in the brain. It belongs to a group of medications called triptans. It is not used to prevent migraines. This medicine may be used for other purposes; ask your health care provider or pharmacist if you have questions. COMMON BRAND NAME(S): Maxalt-MLT What should I tell my care team before I take this medication? They need to know if you have any of these conditions: Circulation problems in fingers and toes Diabetes Heart disease High blood pressure High  cholesterol History of irregular heartbeat History of stroke Stomach or intestine problems Tobacco use An unusual or allergic reaction to rizatriptan, other medications, foods, dyes, or preservatives Pregnant or trying to get pregnant Breast-feeding How should I use this medication? Take this medication by mouth. Take it as directed on the prescription label. You do not need water to take this medication. Leave the tablet in the sealed pack until you are ready to take it. With dry hands, open the pack and gently remove the tablet. If the tablet breaks or crumbles, throw it away. Use a new tablet. Place the tablet on the tongue and allow it to dissolve. Then, swallow it. Do not cut, crush, or chew this medication. Do not use it more often than directed. Talk to your care team about the use of this medication in children. While it may be prescribed for children as young as 6 years for selected conditions, precautions do apply. Overdosage: If you think you have taken too much of this medicine contact a poison control center or emergency room at once. NOTE: This medicine is only for you. Do not share this medicine with others. What if I miss a dose? This does not apply. This medication is not  for regular use. What may interact with this medication? Do not take this medication with any of the following: Ergot alkaloids, such as dihydroergotamine, ergotamine MAOIs, such as Marplan, Nardil, Parnate Other medications for migraine headache, such as almotriptan, eletriptan, frovatriptan, naratriptan, sumatriptan, zolmitriptan This medication may also interact with the following: Certain medications for depression, anxiety, or other mental health conditions Propranolol This list may not describe all possible interactions. Give your health care provider a list of all the medicines, herbs, non-prescription drugs, or dietary supplements you use. Also tell them if you smoke, drink alcohol, or use illegal  drugs. Some items may interact with your medicine. What should I watch for while using this medication? Visit your care team for regular checks on your progress. Tell your care team if your symptoms do not start to get better or if they get worse. This medication may affect your coordination, reaction time, or judgment. Do not drive or operate machinery until you know how this medication affects you. Sit up or stand slowly to reduce the risk of dizzy or fainting spells. If you take migraine medications for 10 or more days a month, your migraines may get worse. Keep a diary of headache days and medication use. Contact your care team if your migraine attacks occur more frequently. What side effects may I notice from receiving this medication? Side effects that you should report to your care team as soon as possible: Allergic reactions--skin rash, itching, hives, swelling of the face, lips, tongue, or throat Burning, pain, tingling, or color changes in the hands, arms, legs, or feet Heart attack--pain or tightness in the chest, shoulders, arms, or jaw, nausea, shortness of breath, cold or clammy skin, feeling faint or lightheaded Heart rhythm changes--fast or irregular heartbeat, dizziness, feeling faint or lightheaded, chest pain, trouble breathing Increase in blood pressure Irritability, confusion, fast or irregular heartbeat, muscle stiffness, twitching muscles, sweating, high fever, seizure, chills, vomiting, diarrhea, which may be signs of serotonin syndrome Raynaud syndrome--cool, numb, or painful fingers or toes that may change color from pale, to blue, to red Seizures Stroke--sudden numbness or weakness of the face, arm, or leg, trouble speaking, confusion, trouble walking, loss of balance or coordination, dizziness, severe headache, change in vision Sudden or severe stomach pain, bloody diarrhea, fever, nausea, vomiting Vision loss Side effects that usually do not require medical attention  (report to your care team if they continue or are bothersome): Dizziness Unusual weakness or fatigue This list may not describe all possible side effects. Call your doctor for medical advice about side effects. You may report side effects to FDA at 1-800-FDA-1088. Where should I keep my medication? Keep out of the reach of children and pets. Store at room temperature between 15 and 30 degrees C (59 and 86 degrees F). Protect from light and moisture. Get rid of any unused medication after the expiration date. To get rid of medications that are no longer needed or have expired: Take the medication to a medication take-back program. Check with your pharmacy or law enforcement to find a location. If you cannot return the medication, check the label or package insert to see if the medication should be thrown out in the garbage or flushed down the toilet. If you are not sure, ask your care team. If it is safe to put it in the trash, empty the medication out of the container. Mix the medication with cat litter, dirt, coffee grounds, or other unwanted substance. Seal the mixture in a bag or  container. Put it in the trash. NOTE: This sheet is a summary. It may not cover all possible information. If you have questions about this medicine, talk to your doctor, pharmacist, or health care provider.  2023 Elsevier/Gold Standard (2021-06-24 00:00:00) Rolanda Lundborg Injection What is this medication? FREMANEZUMAB (fre ma NEZ ue mab) prevents migraines. It works by blocking a substance in the body that causes migraines. It is a monoclonal antibody. This medicine may be used for other purposes; ask your health care provider or pharmacist if you have questions. COMMON BRAND NAME(S): AJOVY What should I tell my care team before I take this medication? They need to know if you have any of these conditions: An unusual or allergic reaction to fremanezumab, other medications, foods, dyes, or preservatives Pregnant or  trying to get pregnant Breast-feeding How should I use this medication? This medication is injected under the skin. You will be taught how to prepare and give it. Take it as directed on the prescription label. Keep taking it unless your care team tells you to stop. It is important that you put your used needles and syringes in a special sharps container. Do not put them in a trash can. If you do not have a sharps container, call your pharmacist or care team to get one. Talk to your care team about the use of this medication in children. Special care may be needed. Overdosage: If you think you have taken too much of this medicine contact a poison control center or emergency room at once. NOTE: This medicine is only for you. Do not share this medicine with others. What if I miss a dose? If you miss a dose, take it as soon as you can. If it is almost time for your next dose, take only that dose. Do not take double or extra doses. What may interact with this medication? Interactions are not expected. This list may not describe all possible interactions. Give your health care provider a list of all the medicines, herbs, non-prescription drugs, or dietary supplements you use. Also tell them if you smoke, drink alcohol, or use illegal drugs. Some items may interact with your medicine. What should I watch for while using this medication? Tell your care team if your symptoms do not start to get better or if they get worse. What side effects may I notice from receiving this medication? Side effects that you should report to your care team as soon as possible: Allergic reactions or angioedema--skin rash, itching or hives, swelling of the face, eyes, lips, tongue, arms, or legs, trouble swallowing or breathing Side effects that usually do not require medical attention (report to your care team if they continue or are bothersome): Pain, redness, or irritation at injection site This list may not describe all  possible side effects. Call your doctor for medical advice about side effects. You may report side effects to FDA at 1-800-FDA-1088. Where should I keep my medication? Keep out of the reach of children and pets. Store in a refrigerator or at room temperature between 20 and 25 degrees C (68 and 77 degrees F). Refrigeration (preferred): Store in the refrigerator. Do not freeze. Keep in the original container until you are ready to take it. Remove the dose from the carton about 30 minutes before it is time for you to use it. If the dose is not used, it may be stored in the original container at room temperature for 7 days. Get rid of any unused medication after the expiration  date. Room Temperature: This medication may be stored at room temperature for up to 7 days. Keep it in the original container. Protect from light until time of use. If it is stored at room temperature, get rid of any unused medication after 7 days or after it expires, whichever is first. To get rid of medications that are no longer needed or have expired: Take the medication to a medication take-back program. Check with your pharmacy or law enforcement to find a location. If you cannot return the medication, ask your pharmacist or care team how to get rid of this medication safely. NOTE: This sheet is a summary. It may not cover all possible information. If you have questions about this medicine, talk to your doctor, pharmacist, or health care provider.  2023 Elsevier/Gold Standard (2016-11-22 00:00:00)

## 2022-01-11 ENCOUNTER — Other Ambulatory Visit (HOSPITAL_COMMUNITY): Payer: Self-pay

## 2022-01-11 MED ORDER — EMGALITY 120 MG/ML ~~LOC~~ SOAJ
120.0000 mg | SUBCUTANEOUS | 11 refills | Status: DC
  Filled 2022-01-11 – 2022-01-24 (×2): qty 1, 30d supply, fill #0
  Filled 2022-03-01: qty 1, 30d supply, fill #1
  Filled 2022-03-28: qty 1, 30d supply, fill #2
  Filled 2022-05-03: qty 1, 30d supply, fill #3
  Filled 2022-06-08: qty 1, 30d supply, fill #4
  Filled 2022-07-07: qty 1, 30d supply, fill #5
  Filled 2022-08-09: qty 1, 30d supply, fill #6
  Filled 2022-08-31: qty 1, 30d supply, fill #7
  Filled 2022-10-05: qty 1, 30d supply, fill #8
  Filled 2022-11-08: qty 1, 30d supply, fill #9
  Filled 2022-12-04: qty 1, 30d supply, fill #10

## 2022-01-11 NOTE — Addendum Note (Signed)
Addended by: Gildardo Griffes on: 01/11/2022 11:33 AM   Modules accepted: Orders

## 2022-01-11 NOTE — Telephone Encounter (Signed)
Pt is ok with switching to [employer] which is preferred by insurance. I sent Emgality 120 mg q30 day prescription to her pharmacy and canceled Ajovy per v.o. Dr Jaynee Eagles.    ------------------- Melvenia Beam, MD  to [facility] Call[facility]     01/10/22  5:55 PM Please offer her emgality, I usually warm people this may happen. Since I gave her a sample in the office she can start with one injection of emgality monthly

## 2022-01-11 NOTE — Telephone Encounter (Signed)
Emgality PA completed on Cover My Meds. Key: [id number]. Awaiting determination from [place].

## 2022-01-13 ENCOUNTER — Telehealth: Payer: Self-pay | Admitting: Internal Medicine

## 2022-01-13 ENCOUNTER — Ambulatory Visit (INDEPENDENT_AMBULATORY_CARE_PROVIDER_SITE_OTHER): Payer: No Typology Code available for payment source | Admitting: Internal Medicine

## 2022-01-13 ENCOUNTER — Other Ambulatory Visit (HOSPITAL_COMMUNITY): Payer: Self-pay

## 2022-01-13 ENCOUNTER — Encounter: Payer: Self-pay | Admitting: Internal Medicine

## 2022-01-13 VITALS — BP 116/72 | HR 69 | Temp 99.2°F

## 2022-01-13 DIAGNOSIS — J0100 Acute maxillary sinusitis, unspecified: Secondary | ICD-10-CM | POA: Diagnosis not present

## 2022-01-13 MED ORDER — AZITHROMYCIN 250 MG PO TABS
ORAL_TABLET | ORAL | 0 refills | Status: AC
  Filled 2022-01-13: qty 6, 5d supply, fill #0

## 2022-01-13 MED ORDER — METHYLPREDNISOLONE ACETATE 80 MG/ML IJ SUSP
80.0000 mg | Freq: Once | INTRAMUSCULAR | Status: AC
  Administered 2022-01-13: 80 mg via INTRAMUSCULAR

## 2022-01-13 NOTE — Progress Notes (Signed)
   Subjective:    Patient ID: Carla Gonzalez, female    DOB: Jul 24, 1985, 36 y.o.   MRN: [id number]  HPI Patient seen acutely today with complaint of sinus congestion and pressure, cough and rhinorrhea for over a week.  She did a COVID test about a week ago and another 1 this past weekend both of which were negative.  Has had no fever.  Has fatigue.  Is worn out with the symptoms.    Review of Systems see above no fever, shaking chills, nausea or vomiting     Objective:   Physical Exam  Blood pressure 116/72, pulse 69 temperature 99.2 degrees by ear thermometer pulse oximetry 99% on room air Skin: Warm and dry.  TMs clear.  Pharynx is clear.  Neck supple.  Chest clear.     Assessment & Plan:   Her symptoms are compatible with acute maxillary sinusitis  Plan: Zithromax Z-PAK 2 tabs day 1 followed by 1 tab days 2 through 5.  Call if not improving in 5 to 7 days or sooner if worse.  Depo-Medrol 80 mg IM given in the office today.

## 2022-01-13 NOTE — Telephone Encounter (Signed)
Carla Gonzalez [phone]  [name] Comment called to say for over a week now she has had sinus congestion and pressure, cough and runny nose, she done a COVID test week ago Thursday and another one over the weekend and both were negative, but she just can not shake this. No fever. Scheduled to come in at 2:00 today.

## 2022-01-19 NOTE — Telephone Encounter (Signed)
The request has been approved. The authorization is effective from 01/11/2022 to 02/10/2022, as long as the member is enrolled in their current health plan. The request was approved as submitted. This authorization is approved for the loading (initial) dose of Emgality '120mg'$ /mL allowing 41m (2 pens) per month.An additional authorization has been approved for the maintenance dose of Emgality '120mg'$ /mL allowing 166m(1 pen) per month and is effective starting 02/03/2022 through 07/03/2022; please reference authorization [id number]. A written notification letter will follow with additional details.

## 2022-01-24 ENCOUNTER — Other Ambulatory Visit (HOSPITAL_COMMUNITY): Payer: Self-pay

## 2022-02-01 NOTE — Patient Instructions (Addendum)
Take Zithromax Z-PAK 2 tabs day 1 followed by 1 tab days 2 through 5.  Call if not improving in 5 to 7 days or sooner if worse.  Depo-Medrol 80 mg IM given in the office today

## 2022-02-08 ENCOUNTER — Other Ambulatory Visit (HOSPITAL_COMMUNITY): Payer: Self-pay

## 2022-02-10 ENCOUNTER — Other Ambulatory Visit (HOSPITAL_COMMUNITY): Payer: Self-pay

## 2022-02-11 ENCOUNTER — Other Ambulatory Visit (HOSPITAL_COMMUNITY): Payer: Self-pay

## 2022-03-02 ENCOUNTER — Other Ambulatory Visit (HOSPITAL_COMMUNITY): Payer: Self-pay

## 2022-03-02 ENCOUNTER — Other Ambulatory Visit: Payer: Self-pay

## 2022-03-28 ENCOUNTER — Other Ambulatory Visit (HOSPITAL_COMMUNITY): Payer: Self-pay

## 2022-03-28 ENCOUNTER — Telehealth (INDEPENDENT_AMBULATORY_CARE_PROVIDER_SITE_OTHER): Payer: 59 | Admitting: Internal Medicine

## 2022-03-28 ENCOUNTER — Telehealth: Payer: Self-pay | Admitting: Internal Medicine

## 2022-03-28 VITALS — BP 118/60 | Temp 98.5°F | Wt 195.0 lb

## 2022-03-28 DIAGNOSIS — J0100 Acute maxillary sinusitis, unspecified: Secondary | ICD-10-CM

## 2022-03-28 MED ORDER — AZITHROMYCIN 250 MG PO TABS
ORAL_TABLET | ORAL | 0 refills | Status: AC
  Filled 2022-03-28: qty 6, 5d supply, fill #0

## 2022-03-28 NOTE — Telephone Encounter (Signed)
Alyn Riedinger [phone]  [name] Comment called to say she would like a video visit today for URI symptoms, she has sore throat, drainage in her throat, head congestion, nose is stuffy, cough, and head pressure, COVID test was negative. Children had last week and have got over thiers without going to doctor, they are home with her today. Scheduled video visit.

## 2022-03-28 NOTE — Telephone Encounter (Signed)
scheduled

## 2022-03-28 NOTE — Progress Notes (Signed)
I connected with Ysidro Evert on 03/28/22  at 12:00 PM EST by video enabled telemedicine visit and verified that I am speaking with the correct person using two identifiers.   I discussed the limitations, risks, security and privacy concerns of performing an evaluation and management service by telemedicine and the availability of in-person appointments. I also discussed with the patient that there may be a patient responsible charge related to this service. The patient expressed understanding and agreed to proceed.   Other persons participating in the visit and their role in the encounter: Medical scribe, Eugene Gavia   Patient's location: Home  Provider's location: Clinic   Chief Complaint: Flu-like symptoms   Subjective:    Patient ID: Carla Gonzalez , female    DOB: Apr 23, 1985, 37 y.o.    MRN: [id number]   37 y.o. female presents today for: Chief Complaint  Patient presents with   Sinus congestion     Patient reports developing a cough and sore throat x6 days ago. Her cough has become progressively more productive with green phlegm. She has been sleeping okay for the most part. She reports pressure in her head with associated sinus and chest congestion. She has been using DayQuil and NyQuil with some relief. She denies any fever, chills, N/V/D, pr headache.  She reports that her children were both sick last week with similar viral symptoms        Family History  Problem Relation Age of Onset   Hypertension Father    Melanoma Father    Migraines Neg Hx    Colon cancer Neg Hx    Stomach cancer Neg Hx    Headache Neg Hx     Past Medical History:  Diagnosis Date   Hypothyroidism    Pectus excavatum    Renal cyst 2023   left; benign appearing     Social History   Social History Narrative   Works as cardiology NP.  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     Patient Care Team: Elby Showers, MD as PCP - General (Internal Medicine)   Review of Systems   Constitutional:  Negative for chills, fever and malaise/fatigue.  HENT:  Positive for congestion and sore throat.   Eyes:  Negative for blurred vision.  Respiratory:  Positive for cough and sputum production. Negative for shortness of breath.   Cardiovascular:  Negative for chest pain, palpitations and leg swelling.  Gastrointestinal:  Negative for vomiting.  Musculoskeletal:  Negative for back pain.  Skin:  Negative for rash.  Neurological:  Negative for loss of consciousness and headaches.        Objective:   Vitals: BP 118/60   Temp 98.5 F (36.9 C)   Wt 195 lb (88.5 kg)   BMI 30.54 kg/m    Physical Exam Vitals and nursing note reviewed.  Constitutional:      General: She is not in acute distress.    Appearance: Normal appearance. She is not ill-appearing or toxic-appearing.  HENT:     Head: Normocephalic and atraumatic.  Musculoskeletal:     Cervical back: Normal range of motion.  Neurological:     General: No focal deficit present.     Mental Status: She is alert and oriented to person, place, and time. Mental status is at baseline.  Psychiatric:        Mood and Affect: Mood normal.        Behavior: Behavior normal.        Thought Content: Thought content  normal.        Judgment: Judgment normal.        Assessment & Plan:    Sinusitis: Will order a z-pak today. ***   I,Alexis Herring,acting as a scribe for Elby Showers, MD.,have documented all relevant documentation on the behalf of Elby Showers, MD,as directed by  Elby Showers, MD while in the presence of Elby Showers, MD.   ***

## 2022-03-30 ENCOUNTER — Encounter: Payer: Self-pay | Admitting: Internal Medicine

## 2022-04-21 ENCOUNTER — Encounter: Payer: Self-pay | Admitting: Neurology

## 2022-04-25 MED ORDER — NURTEC 75 MG PO TBDP: 75.0000 mg | ORAL_TABLET | Freq: Every day | ORAL | 11 refills | Status: DC | PRN

## 2022-04-25 NOTE — Addendum Note (Signed)
Addended by: Sarina Ill B on: 04/25/2022 12:46 PM   Modules accepted: Orders

## 2022-05-03 ENCOUNTER — Telehealth: Payer: Self-pay | Admitting: Neurology

## 2022-05-03 NOTE — Telephone Encounter (Signed)
[employer] Elmyra Ricks) following up on PA for Rimegepant Sulfate (NURTEC) 75 MG TBDP

## 2022-05-05 ENCOUNTER — Other Ambulatory Visit (HOSPITAL_COMMUNITY): Payer: Self-pay

## 2022-05-05 NOTE — Telephone Encounter (Signed)
Pharmacy Patient Advocate Encounter  Prior Authorization for Nurtec '75MG'$  dispersible tablets has been approved by [employer] (ins).    PA #  PA Case ID: [id number] Effective dates: 05/05/2022 through 11/03/2022

## 2022-05-05 NOTE — Telephone Encounter (Signed)
Pharmacy Patient Advocate Encounter   Received notification from [place] that prior authorization for Nurtec '75MG'$  dispersible tablets is required/requested.   PA submitted on 05/05/2022 to (ins) [employer] via CoverMyMeds Key or [facility]) confirmation # [id number] Status is pending

## 2022-05-10 NOTE — Telephone Encounter (Signed)
Approval letter faxed to [facility] one source at [phone]. Received a receipt of confirmation.  Marland Kitchen

## 2022-05-11 ENCOUNTER — Encounter: Payer: Self-pay | Admitting: Neurology

## 2022-05-11 ENCOUNTER — Telehealth (INDEPENDENT_AMBULATORY_CARE_PROVIDER_SITE_OTHER): Payer: 59 | Admitting: Neurology

## 2022-05-11 DIAGNOSIS — G43009 Migraine without aura, not intractable, without status migrainosus: Secondary | ICD-10-CM | POA: Insufficient documentation

## 2022-05-11 DIAGNOSIS — G43709 Chronic migraine without aura, not intractable, without status migrainosus: Secondary | ICD-10-CM | POA: Diagnosis not present

## 2022-05-11 NOTE — Progress Notes (Signed)
[facility]     Virtual Visit via Video Note  I connected with Carla Gonzalez on 05/11/22 at  7:30 AM EST by a video enabled telemedicine application and verified that I am speaking with the correct person using two identifiers.  Location: Patient: home Provider: office   I discussed the limitations of evaluation and management by telemedicine and the availability of in person appointments. The patient expressed understanding and agreed to proceed.  Follow Up Instructions:    I discussed the assessment and treatment plan with the patient. The patient was provided an opportunity to ask questions and all were answered. The patient agreed with the plan and demonstrated an understanding of the instructions.   The patient was advised to call back or seek an in-person evaluation if the symptoms worsen or if the condition fails to improve as anticipated.  I provided over 10 minutes of non-face-to-face time during this encounter.   Melvenia Beam, MD  Provider:  Dr Jaynee Eagles Requesting Provider: [name], MD Primary Care Provider:  Elby Showers, MD  CC:  migraines  05/11/2022: She took nurtec a few times 4 pills the last month, still did well had a few more migraines but overall emgality still working great, but if the migraines worsen I will prescribe ajovy. Can load with ajovy 3 injections and switch over.   Medication tried include: Verapamil(propranolol contraindicated due to hypotensiom), Topamax, amitriptyline. Sumatriptan. Rizatroiptan.Emgality, aimovig contraindicated due to constipation.   04/25/2022 update: doing great! Now have <6 migraine days a month and < 8 total headache days a month failed imitrex and maxalt will order nurtec.   01/10/2022: She is still having migraines, with vertigo, she tried to treat her vestibular symptoms as sinus congestion and eustacian tube, did not work, worse with her cycles, by conservative measures, she has been  vestibular therapy and done Epley maneuvers, she still has migrainous headaches, they can be unilateral or holocephalic, pulsating pounding throbbing, photophobia phonophobia, nausea, hurts to move, they can last 40 to 72 hours and be moderate to severe, it is affecting her life and her job, no medication overuse, she has vertigo which is associated with the migraines but I do not believe that this is an aura.  We have tried to treat her as sinus congestion, she has done Nettie pot, decongestions, Epley maneuvers, therapy, meclizine, verapamil,.  At this point we need to treat her for migraines with vertigo. The vertigo comes with the sever migraines, it is does not proceed it or is not a harbinger of her migraines which why I do not think this is an aura, but we did talk about migraine with aura and risk of stroke. She has at least for the last 6 months has > 8 mod to severe migraine days a month and > 15 total headache days a month.No other focal neurologic deficits, associated symptoms, inciting events or modifiable factors.     Personally reviewed images and agree:  CLINICAL DATA:  Dizziness, persistent/recurrent, cardiac or vascular cause suspected; Neuro deficit, acute, stroke suspected; Neuro deficit, acute, stroke suspected vertigo   EXAM: MRI HEAD WITHOUT CONTRAST   MRA HEAD WITHOUT CONTRAST   MRA NECK WITHOUT CONTRAST   TECHNIQUE: Multiplanar, multiecho pulse sequences of the brain and surrounding structures were obtained without intravenous contrast. Angiographic images of the Circle of Willis were obtained using MRA technique without intravenous contrast. Angiographic images of the neck were obtained using MRA technique without intravenous contrast. Carotid stenosis measurements (  when applicable) are obtained utilizing NASCET criteria, using the distal internal carotid diameter as the denominator.   COMPARISON:  None Available.   FINDINGS: MRI HEAD FINDINGS   Brain: No  acute infarction, hemorrhage, hydrocephalus, extra-axial collection or mass lesion.   Vascular: Detailed below.   Skull and upper cervical spine: Normal marrow signal.   Sinuses/Orbits: Largely clear sinuses.  No acute orbital findings.   Other: No mastoid effusions.   MRA HEAD FINDINGS   Anterior circulation: Bilateral intracranial ICAs, MCAs, and ACAs are patent without proximal hemodynamically significant stenosis. No aneurysm identified.   Posterior circulation: Bilateral intradural vertebral arteries, basilar artery and posterior cerebral arteries are patent without proximal hemodynamically significant stenosis. No aneurysm identified.   MRA NECK FINDINGS   Carotid systems: The common carotid arteries and internal carotid is are patent bilaterally without evidence of significant (greater than 50%) stenosis.   Vertebral arteries: Left dominant. Both vertebral arteries are patent without evidence of significant (greater than 50%) stenosis.   IMPRESSION: 1. No evidence of acute intracranial abnormality. 2. No large vessel occlusion or proximal hemodynamically significant stenosis in the head or neck.  Patient complains of symptoms per HPI as well as the following symptoms: migraine . Pertinent negatives and positives per HPI. All others negative   HPI:  Carla Gonzalez is a 37 y.o. female here as requested by Elby Showers, MD for vertigo. PMHx fragile X chromosome, dilated pulmonary artery, symptomatic bradycardia.  I reviewed Dr. 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   She has rightward nystagmus probably BPPV which may be aggravated by stress, comes on without warning and alarming to her makes her feel a bit out of control, she has a stressful job, she does not have tinnitus with these episodes, they also gave her Antivert.   She had her youngest daughter in June, she had a lot of problems with bradycardia and flushing and palpitations, a few months later she would get dizzy standing in the kitchen, she was diagnosed with hypothyroidism, dizziness was better on synthroid, it was bothering her at work, she felt imbalanced, the room would feel like it would get narrow and dark, 2 months ago her dizziness came back, just the dizziness, dizziness if she turns her head too much or even with sitting, meclizine gave her side effects, she feels like she is on a ride, she feels she is moving but the room is not, movement when there isn't. A few episodes she felt lightheaded and her vision narrowed. Her kids are sick constantly. She has been on antibitotics. She went to the eye doctor. No headaches. Decreased hearing on the right side like in a tunnel. No other focal neurologic deficits, associated symptoms, inciting events or modifiable factors.  Medications tried; Verapamil,   Reviewed notes, labs and imaging from outside physicians, which showed: see above  Review of Systems: Patient complains of symptoms per HPI as well as the following symptoms dizziness. Pertinent negatives and positives per HPI. All others negative.   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    Social Determinants of Health   Financial Resource Strain: Not on file  Food Insecurity: Not on file  Transportation Needs: Not on file  Physical Activity: Not on file  Stress: Not on file  Social Connections: Not on file  Intimate Partner Violence: Not on file    Family History  Problem Relation Age of Onset   Hypertension Father    Melanoma Father    Migraines Neg Hx    Colon cancer Neg Hx    Stomach cancer Neg Hx    Headache Neg Hx     Past Medical History:  Diagnosis Date   Hypothyroidism    Pectus excavatum    Renal cyst 2023   left; benign appearing    Patient Active Problem List   Diagnosis Date Noted   Migraine without aura and without status migrainosus, not intractable 05/11/2022   Chronic migraine without aura without status migrainosus, not intractable 01/10/2022   Symptomatic bradycardia 08/18/2019   Dilated pulmonary artery 08/18/2019   Cesarean delivery due to previous difficult delivery, delivered, current hospitalization 08/12/2019   Fetal malpresentation, delivered, current hospitalization 08/12/2019   Fragile x chromosome 04/04/2016    Past Surgical History:  Procedure Laterality Date   APPENDECTOMY     CESAREAN SECTION N/A 08/12/2019   Procedure: CESAREAN SECTION;  Surgeon: Jerelyn Charles, MD;  Location: [facility] LD ORS;  Service: Obstetrics;  Laterality: N/A;   Grommet     TONSILLECTOMY AND ADENOIDECTOMY  2009    Current Outpatient Medications  Medication Sig Dispense Refill   Galcanezumab-gnlm (EMGALITY) 120 MG/ML SOAJ Inject 120 mg into the skin every 30 (thirty) days. 1 mL 11   levothyroxine (SYNTHROID) 88 MCG tablet Take 1 tablet (88 mcg total) by mouth daily. 90 tablet 3   norgestrel-ethinyl estradiol (LOW-OGESTREL) 0.3-30 MG-MCG tablet Take 1 tablet by mouth daily. 84 tablet 4   norgestrel-ethinyl  estradiol (LOW-OGESTREL) 0.3-30 MG-MCG tablet Take 1 tablet by mouth daily. 84 tablet 4   ondansetron (ZOFRAN-ODT) 4 MG disintegrating tablet Take 1-2 tablets (4-8 mg total) by mouth every 8 (eight) hours as needed. 30 tablet 3   Rimegepant Sulfate (NURTEC) 75 MG TBDP Take 75 mg by mouth daily as needed. For migraines. Take as close to onset of migraine as possible. One daily maximum. (Patient not taking: Reported on 03/28/2022) 6 tablet 0   Rimegepant Sulfate (NURTEC) 75 MG TBDP Take 1 tablet (75 mg total) by mouth daily as needed. For migraines. Take as close to onset of migraine as possible. One daily maximum. 16 tablet 11   rizatriptan (MAXALT-MLT) 10 MG disintegrating tablet Take 1 tablet (10 mg total) by mouth as needed for migraine. May repeat in 2 hours if needed (Patient not taking: Reported on 03/28/2022) 9 tablet 11   No current facility-administered medications for this visit.    Allergies as of 05/11/2022 - Review Complete 05/11/2022  Allergen Reaction Noted  Sulfa antibiotics Hives 09/01/2011    Vitals: There were no vitals taken for this visit. Last Weight:  Wt Readings from Last 1 Encounters:  03/28/22 195 lb (88.5 kg)   Last Height:   Ht Readings from Last 1 Encounters:  01/10/22 '5\' 7"'$  (1.702 m)     Physical exam: Exam: Gen: NAD, conversant      CV: Denies palpitations or chest pain or SOB. VS: Breathing at a normal rate. Weight appears within normal limits. Not febrile. Eyes: Conjunctivae clear without exudates or hemorrhage  Neuro: Detailed Neurologic Exam  Speech:    Speech is normal; fluent and spontaneous with normal comprehension.  Cognition:    The patient is oriented to person, place, and time;     recent and remote memory intact;     language fluent;     normal attention, concentration,     fund of knowledge Cranial Nerves:    The pupils are equal, round, and reactive to light. Cannot perform fundoscopic exam. Visual fields are full to finger  confrontation. Extraocular movements are intact.  The face is symmetric with normal sensation. The palate elevates in the midline. Hearing intact. Voice is normal. Shoulder shrug is normal. The tongue has normal motion without fasciculations.   Coordination:    Normal finger to nose  Gait:    Normal native gait  Motor Observation:   no involuntary movements noted. Tone:    Appears normal  Posture:    Posture is normal. normal erect    Strength:    Strength is anti-gravity and symmetric in the upper and lower limbs.      Sensation: intact to LT      Assessment/Plan:  Lovely 37 year old patient with chronic migraines with vertigo.   She took nurtec a few times 4 pills the last month, still did well had a few more migraines but overall emgality still working great, but if the migraines worsen I will prescribe ajovy. Can load with ajovy 3 injections and switch over. 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 She has a few colleagues, we can accommodate them as well for migraines.  Medication tried include: Verapamil(propranolol contraindicated due to hypotensiom), Topamax, amitriptyline. Sumatriptan. Rizatroiptan.Emgality, aimovig contraindicated due to constipation.   Preventative: Emgality  Acute: Maxalt and Ondansetron She is not having any children, husband has had children and uses backup.   For allergies:   Medrol dosepak :Inner ear inflammation/fluid behind right ear: Kids have bee sick, maybe viral. She has been on antibiotics. She reports pressure behind ear. Try medrol dosepak OTC Flonase or other nasal steroidal spray daily for at least 4 weeks: may help with inner ear fluid and inflammation along with the medrol dosepak Netti Pot with distilled water daily: watched online video can help with congestion and sinus issues Decongestant ZyrtecD for example for several weeks Consider MRI brain if no resolution with above Vestibular rehab for Epley Maneuver  teaching but I don;t think this is crystals dislodged(IE BPPV), will order in case above does not resolve Consider ENT/Allergy. She has some fluid behind the ears. Consider ear tubes? She had them as a child, may consider as last resort  Cc: Baxley, Cresenciano Lick, MD,  Baxley, Cresenciano Lick, MD  Sarina Ill, MD  [facility] [address] [place], [place] [address]  Phone [phone] Fax [phone]

## 2022-05-17 ENCOUNTER — Ambulatory Visit: Payer: 59 | Admitting: Family Medicine

## 2022-07-07 ENCOUNTER — Other Ambulatory Visit (HOSPITAL_COMMUNITY): Payer: Self-pay

## 2022-07-08 ENCOUNTER — Other Ambulatory Visit (HOSPITAL_COMMUNITY): Payer: Self-pay

## 2022-07-11 ENCOUNTER — Other Ambulatory Visit (HOSPITAL_COMMUNITY): Payer: Self-pay

## 2022-07-11 ENCOUNTER — Telehealth: Payer: Self-pay | Admitting: *Deleted

## 2022-07-11 NOTE — Telephone Encounter (Signed)
Renewal PA for [employer] completed. Key: [id number]. [employer] approved immediately. Patient has been notified.

## 2022-07-18 ENCOUNTER — Other Ambulatory Visit: Payer: Self-pay | Admitting: Internal Medicine

## 2022-07-18 ENCOUNTER — Other Ambulatory Visit (HOSPITAL_COMMUNITY): Payer: Self-pay

## 2022-07-18 MED ORDER — LEVOTHYROXINE SODIUM 88 MCG PO TABS
88.0000 ug | ORAL_TABLET | Freq: Every day | ORAL | 1 refills | Status: DC
Start: 2022-07-18 — End: 2022-10-17
  Filled 2022-07-25: qty 90, 90d supply, fill #0
  Filled 2022-10-05: qty 90, 90d supply, fill #1

## 2022-07-19 ENCOUNTER — Other Ambulatory Visit (HOSPITAL_COMMUNITY): Payer: Self-pay

## 2022-07-19 DIAGNOSIS — L650 Telogen effluvium: Secondary | ICD-10-CM | POA: Diagnosis not present

## 2022-07-19 DIAGNOSIS — L649 Androgenic alopecia, unspecified: Secondary | ICD-10-CM | POA: Diagnosis not present

## 2022-07-19 MED ORDER — CLOBETASOL PROPIONATE 0.05 % EX SOLN
1.0000 | Freq: Every day | CUTANEOUS | 5 refills | Status: DC
  Filled 2022-07-19: qty 50, 20d supply, fill #0

## 2022-07-25 ENCOUNTER — Other Ambulatory Visit (HOSPITAL_BASED_OUTPATIENT_CLINIC_OR_DEPARTMENT_OTHER): Payer: Self-pay

## 2022-07-25 ENCOUNTER — Other Ambulatory Visit (HOSPITAL_COMMUNITY): Payer: Self-pay

## 2022-08-09 ENCOUNTER — Other Ambulatory Visit (HOSPITAL_COMMUNITY): Payer: Self-pay

## 2022-08-11 ENCOUNTER — Other Ambulatory Visit (HOSPITAL_COMMUNITY): Payer: Self-pay

## 2022-08-31 ENCOUNTER — Other Ambulatory Visit (HOSPITAL_COMMUNITY): Payer: Self-pay

## 2022-10-05 ENCOUNTER — Other Ambulatory Visit (HOSPITAL_COMMUNITY): Payer: Self-pay

## 2022-10-07 ENCOUNTER — Other Ambulatory Visit (HOSPITAL_COMMUNITY): Payer: Self-pay

## 2022-10-10 NOTE — Progress Notes (Shared)
Patient Care Team: Margaree Mackintosh, MD as PCP - General (Internal Medicine)  Visit Date: 10/10/22  Subjective:    Patient ID: Carla Gonzalez , Female   DOB: 02-Jul-1985, 37 y.o.    MRN: [id number]   37 y.o. Female presents today for annual comprehensive physical exam. History of hypothyroidism.  History of migraine headaches treated with Emgality 120 mg monthly, Nurtec 75 mg daily as needed.  History of hypothyroidism treated with levothyroxine 88 mcg daily.   In June 2022 she was noted to have issues with tachycardia and some vague dizziness with fatigue.  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  She works as a Transport planner for [employer].  She is married and has 2 small children.  She had respiratory syncytial virus November 05, 2019.  She is tested positive for rhinovirus in March 2022, had melanocytic nevus removed from left neck by dermatologist April 2022.  Had acute maxillary sinusitis in March 2022.   Had COVID-19 along with several members of her family on vacation in [place] late Summer 2022.  In June 2021, she had recently given birth via C-section and subsequently developed some chest tightness associated with bradycardia for 2 days.  She noticed heart rate dropping to the 30s on her apple watch and she went to med [facility] emergency department.  She had bradycardia with sinus arrhythmia with rate of 42.  BNP was mildly elevated at 270 and chest x-ray shows mild cardiomegaly with increased attenuation at the lung bases.  CTA was negative for PE but noted to have dilated main pulmonary artery with diameter of 3.8 cm.  CBC and electrolytes were normal.  She was admitted to the hospital and seen by Dr. Johney Frame who felt that patient had increased vagal tone with recent pregnancy.  She was given 1 dose IV Lasix with improvement of throat fullness sensation.  Echo was within normal limits she has not had recurrence.  She works long hours.   Her husband is supportive.  Children have been ill with respiratory infections recently.   She tries to exercise regularly by riding a stationary bike.  Recent labs are within normal limits and TSH on thyroid replacement medication is excellent at 1.90.  Vertigo seems to be occurring at work.  She does not smoke or consume alcohol.  She had a benign nevus removed from left neck spring 2022.  Patient has had appendectomy and C-section, tonsillectomy and adenoidectomy  In November 2021, she had acute left otitis media.  In March 2022, she had protracted respiratory infection but children were ill.  Family history remarkable for father with history of melanoma and hypertension.  Pap smear last completed 02/24/16. Negative for intraepithelial lesion or malignancy.    Vaccine counseling: UTD on tetanus vaccine.  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       ROS      Objective:   Vitals: There were no vitals taken for this  visit.   Physical Exam    Results:   Studies obtained and personally reviewed by me:  Imaging, colonoscopy, mammogram, bone density scan, echocardiogram, heart cath, stress test, CT calcium score, etc. ***   Labs:       Component Value Date/Time   NA 141 09/10/2021 1221   K 3.6 09/10/2021 1221   CL 104 09/10/2021 1221   CO2 28 09/10/2021 1221   GLUCOSE 92 09/10/2021 1221   BUN 10 09/10/2021 1221   CREATININE 0.86 09/10/2021 1221   CREATININE 0.81 09/03/2021 1125   CALCIUM 8.8 (L) 09/10/2021 1221   PROT 6.8 09/03/2021 1125   ALBUMIN 4.6 04/26/2021 1204   AST 14 09/03/2021 1125    ALT 11 09/03/2021 1125   ALKPHOS 59 04/26/2021 1204   BILITOT 0.4 09/03/2021 1125   GFRNONAA >60 09/10/2021 1221   GFRNONAA 115 09/03/2020 1235   GFRAA 134 09/03/2020 1235     Lab Results  Component Value Date   WBC 10.9 (H) 09/10/2021   HGB 13.4 09/10/2021   HCT 40.8 09/10/2021   MCV 88.5 09/10/2021   PLT 303 09/10/2021    Lab Results  Component Value Date   CHOL 138 01/26/2021   HDL 51 01/26/2021   LDLCALC 73 01/26/2021   TRIG 67 01/26/2021   CHOLHDL 2.7 01/26/2021    Lab Results  Component Value Date   HGBA1C 5.5 08/18/2019     Lab Results  Component Value Date   TSH 2.997 09/10/2021     No results found for: "PSA1", "PSA" *** delete for female pts  ***    Assessment & Plan:   ***    I,Alexander Ruley,acting as a scribe for Margaree Mackintosh, MD.,have documented all relevant documentation on the behalf of Margaree Mackintosh, MD,as directed by  Margaree Mackintosh, MD while in the presence of Margaree Mackintosh, MD.   ***

## 2022-10-13 ENCOUNTER — Other Ambulatory Visit: Payer: 59

## 2022-10-13 DIAGNOSIS — Z13220 Encounter for screening for lipoid disorders: Secondary | ICD-10-CM | POA: Diagnosis not present

## 2022-10-13 DIAGNOSIS — Z1329 Encounter for screening for other suspected endocrine disorder: Secondary | ICD-10-CM | POA: Diagnosis not present

## 2022-10-13 DIAGNOSIS — Z0000 Encounter for general adult medical examination without abnormal findings: Secondary | ICD-10-CM | POA: Diagnosis not present

## 2022-10-13 LAB — CBC WITH DIFFERENTIAL/PLATELET
Absolute Monocytes: 418 cells/uL (ref 200–950)
Basophils Absolute: 49 cells/uL (ref 0–200)
Basophils Relative: 0.6 %
Eosinophils Absolute: 197 cells/uL (ref 15–500)
Eosinophils Relative: 2.4 %
HCT: 42.5 % (ref 35.0–45.0)
Hemoglobin: 14.2 g/dL (ref 11.7–15.5)
Lymphs Abs: 2608 cells/uL (ref 850–3900)
MCH: 28.6 pg (ref 27.0–33.0)
MCHC: 33.4 g/dL (ref 32.0–36.0)
MCV: 85.7 fL (ref 80.0–100.0)
MPV: 9.3 fL (ref 7.5–12.5)
Monocytes Relative: 5.1 %
Neutro Abs: 4928 cells/uL (ref 1500–7800)
Neutrophils Relative %: 60.1 %
Platelets: 290 10*3/uL (ref 140–400)
RBC: 4.96 10*6/uL (ref 3.80–5.10)
RDW: 12.9 % (ref 11.0–15.0)
Total Lymphocyte: 31.8 %
WBC: 8.2 10*3/uL (ref 3.8–10.8)

## 2022-10-13 NOTE — Progress Notes (Signed)
Patient Care Team: Margaree Mackintosh, MD as PCP - General (Internal Medicine)  Visit Date: 10/17/22  Subjective:    Patient ID: Carla Gonzalez , Female   DOB: 1986/01/23, 37 y.o.    MRN: [id number]   37 y.o. Female presents today for annual comprehensive physical exam.   Generally she is feeling well today. A couple weeks ago she had a recurring Covid-19 infection. She is considering not receiving a Covid-19 booster this Fall. She will update her influenza vaccination this Fall. UTD with tetanus vaccine.  History of persistent vertigo and rightward nystagmus. Thought to be probably benign positional vertigo which may be aggravated by stress.  This is alarming to her because it comes on without warning and makes her feel a bit out of control.  She has a stressful job.  She has 2 children and sometimes they were sick but her husband is quite supportive. We referred her to neurology and she was seen by Dr. Lucia Gaskins 02/2021 and given Medrol dosepak. Later presented to the ER 09/10/2021 with vertigo suspected likely due to inner ear pathology; treated with IV fluids, Ativan, and meclizine. She was referred to vestibular rehab at [facility] center. Currently on a regimen of Maxalt and Zofran as needed.  History of hypothyroidism, treated with Synthroid 88 mcg daily.  TSH normal at 3.11 as of 10/2022.  In June 2022 she was noted to have issues with tachycardia and some vague dizziness with fatigue.  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 Apparently vertigo symptoms get worse after she arrives at work sometime around midmorning.  She had respiratory syncytial virus November 05, 2019.  She tested positive for rhinovirus in March 2022, had melanocytic nevus removed from left neck by dermatologist April 2022.  In November 2021, she had acute left otitis media.  In March 2022, she had protracted respiratory infection but children were ill. Had acute  maxillary sinusitis in March 2022.   Had COVID-19 along with several members of her family on vacation in [place] late Summer 2022.  In June 2021, she had recently given birth via C-section and subsequently developed some chest tightness associated with bradycardia for 2 days.  She noticed heart rate dropping to the 30s on her apple watch and she went to med [facility] emergency department.  She had bradycardia with sinus arrhythmia with rate of 42.  BNP was mildly elevated at 270 and chest x-ray shows mild cardiomegaly with increased attenuation at the lung bases.  CTA was negative for PE but noted to have dilated main pulmonary artery with diameter of 3.8 cm.  CBC and electrolytes were normal.  She was admitted to the hospital and seen by Dr. Johney Frame who felt that patient had increased vagal tone with recent pregnancy.  She was given 1 dose IV Lasix with improvement of throat fullness sensation.  Echo was within normal limits she has not had recurrence.  History of dysmenorrhea, treated with Loestrin per GYN.  Social history: She works as a Transport planner for [employer].  She is married and has 2 small children. She works long hours.  Her husband is supportive. She does not smoke or consume alcohol.      Family history: remarkable for father with history of melanoma and hypertension.  Surgical history: Patient has had appendectomy and C-section, tonsillectomy and adenoidectomy.  Exercise:  She tries to exercise regularly by riding a stationary bike.    Past Medical History:  Diagnosis Date  . Hypothyroidism   . Pectus excavatum   . Renal cyst 2023   left; benign appearing     Family History  Problem Relation Age of Onset  . Hypertension Father   . Melanoma Father   . Migraines Neg Hx   . Colon cancer Neg Hx   . Stomach cancer Neg Hx   . Headache Neg Hx     Social History   Social History Narrative   Works as cardiology NP.  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       Review of Systems   Constitutional:  Negative for fever and malaise/fatigue.  HENT:  Negative for congestion.   Eyes:  Negative for blurred vision.  Respiratory:  Negative for cough and shortness of breath.   Cardiovascular:  Negative for chest pain, palpitations and leg swelling.  Gastrointestinal:  Negative for vomiting.  Musculoskeletal:  Negative for back pain.  Skin:  Negative for rash.  Neurological:  Negative for loss of consciousness and headaches.        Objective:   Vitals: There were no vitals taken for this visit.   Physical Exam Vitals and nursing note reviewed.  Constitutional:      General: She is not in acute distress.    Appearance: Normal appearance. She is not ill-appearing or toxic-appearing.  HENT:     Head: Normocephalic and atraumatic.     Right Ear: Hearing, tympanic membrane, ear canal and external ear normal.     Left Ear: Hearing, tympanic membrane, ear canal and external ear normal.     Mouth/Throat:     Pharynx: Oropharynx is clear.  Eyes:     Extraocular Movements: Extraocular movements intact.     Pupils: Pupils are equal, round, and reactive to light.  Neck:     Thyroid: No thyroid mass, thyromegaly or thyroid tenderness.     Vascular: No carotid bruit.  Cardiovascular:     Rate and Rhythm: Normal rate and regular rhythm. No extrasystoles are present.    Pulses:          Dorsalis pedis pulses are 1+ on the right side and 1+ on the left side.     Heart sounds: Normal heart sounds. No murmur heard.    No friction rub. No gallop.  Pulmonary:     Effort: Pulmonary effort is normal.     Breath sounds: Normal breath sounds. No decreased breath sounds, wheezing, rhonchi or rales.  Chest:     Chest wall: No mass.     Comments: Breast exam deferred to GYN. Abdominal:     Palpations: Abdomen is soft. There is no hepatomegaly, splenomegaly or mass.     Tenderness: There is no abdominal tenderness.     Hernia: No hernia is present.  Genitourinary:    Comments:  Pelvic exam deferred to GYN. Musculoskeletal:     Cervical back: Normal range of motion.     Right lower leg: No edema.     Left lower leg: No edema.  Lymphadenopathy:     Cervical: No cervical adenopathy.     Upper Body:     Right upper body: No supraclavicular adenopathy.     Left upper body: No supraclavicular adenopathy.  Skin:    General: Skin is warm and dry.  Neurological:     General: No focal deficit present.     Mental Status: She is alert and oriented to person, place, and time. Mental status is at baseline.     Sensory: Sensation  is intact.     Motor: Motor function is intact. No weakness.     Deep Tendon Reflexes: Reflexes are normal and symmetric.  Psychiatric:        Attention and Perception: Attention normal.        Mood and Affect: Mood normal.        Speech: Speech normal.        Behavior: Behavior normal.        Thought Content: Thought content normal.        Cognition and Memory: Cognition normal.        Judgment: Judgment normal.      Results:   Studies obtained and personally reviewed by me:  MRA Head/Neck  09/10/2021: IMPRESSION: 1. No evidence of acute intracranial abnormality. 2. No large vessel occlusion or proximal hemodynamically significant stenosis in the head or neck.   Labs:       Component Value Date/Time   NA 136 10/13/2022 0928   K 4.3 10/13/2022 0928   CL 104 10/13/2022 0928   CO2 23 10/13/2022 0928   GLUCOSE 87 10/13/2022 0928   BUN 13 10/13/2022 0928   CREATININE 0.82 10/13/2022 0928   CALCIUM 9.3 10/13/2022 0928   PROT 7.3 10/13/2022 0928   ALBUMIN 4.6 04/26/2021 1204   AST 16 10/13/2022 0928   ALT 11 10/13/2022 0928   ALKPHOS 59 04/26/2021 1204   BILITOT 0.5 10/13/2022 0928   GFRNONAA >60 09/10/2021 1221   GFRNONAA 115 09/03/2020 1235   GFRAA 134 09/03/2020 1235     Lab Results  Component Value Date   WBC 8.2 10/13/2022   HGB 14.2 10/13/2022   HCT 42.5 10/13/2022   MCV 85.7 10/13/2022   PLT 290 10/13/2022     Lab Results  Component Value Date   CHOL 148 10/13/2022   HDL 55 10/13/2022   LDLCALC 79 10/13/2022   TRIG 64 10/13/2022   CHOLHDL 2.7 10/13/2022    Lab Results  Component Value Date   HGBA1C 5.5 08/18/2019     Lab Results  Component Value Date   TSH 3.11 10/13/2022       Assessment & Plan:   Persistent Vertigo and rightward nystagmus - I think this is probably benign positional vertigo which may be aggravated by stress.  This is alarming to her because it comes on without warning and makes her feel a bit out of control.  She has a stressful job.  She has 2 children and sometimes they were sick but her husband is quite supportive. We referred her to neurology and she was seen by Dr. Lucia Gaskins 02/2021 and given Medrol dosepak. Later presented to the ER 09/10/2021 with vertigo suspected likely due to inner ear pathology; treated with IV fluids, Ativan, and meclizine. She was referred to vestibular rehab at [facility] center. Currently on a regimen of Maxalt and Zofran as needed.  Hypothyroidism - stable on thyroid replacement medication (88 mcg Synthroid daily). TSH normal at 3.11, increased from 2.997 on 09/2021. After shared decision making, we will increase her Synthroid to 100 mcg daily.  History of dysmenorrhea - was previously on Loestrin per GYN. She has a follow-up appointment with GYN later this year, they will discuss mammogram. Breast and pelvic exams are deferred to GYN.   Labs 10/13/2022 are reviewed with the patient: TSH normal at 3.11. CBC and CMP within normal limits. Lipid panel with total cholesterol 148, HDL 55, triglycerides 64, LDL 79.  Vaccine counseling:  A couple weeks ago  she had a recurring Covid-19 infection. She is considering not receiving a Covid-19 booster this Fall. She will update her influenza vaccination this Fall. UTD with tetanus vaccine.  Plan: TSH has steadily increased. After shared decision making, we will increase her Synthroid to 100  mcg daily. Return in 1 year for annual physical or sooner as needed.  I,Mathew Stumpf,acting as a Neurosurgeon for Margaree Mackintosh, MD.,have documented all relevant documentation on the behalf of Margaree Mackintosh, MD,as directed by  Margaree Mackintosh, MD while in the presence of Margaree Mackintosh, MD.   I, Margaree Mackintosh, MD, have reviewed all documentation for this visit. The documentation on 10/17/22 for the exam, diagnosis, procedures, and orders are all accurate and complete.

## 2022-10-17 ENCOUNTER — Ambulatory Visit: Payer: 59 | Admitting: Internal Medicine

## 2022-10-17 ENCOUNTER — Encounter: Payer: Self-pay | Admitting: Internal Medicine

## 2022-10-17 ENCOUNTER — Other Ambulatory Visit (HOSPITAL_COMMUNITY): Payer: Self-pay

## 2022-10-17 VITALS — BP 102/60 | HR 61 | Ht 67.0 in | Wt 203.0 lb

## 2022-10-17 DIAGNOSIS — Z0000 Encounter for general adult medical examination without abnormal findings: Secondary | ICD-10-CM

## 2022-10-17 DIAGNOSIS — E039 Hypothyroidism, unspecified: Secondary | ICD-10-CM

## 2022-10-17 DIAGNOSIS — Z8669 Personal history of other diseases of the nervous system and sense organs: Secondary | ICD-10-CM

## 2022-10-17 LAB — POCT URINALYSIS DIP (CLINITEK)
Bilirubin, UA: NEGATIVE
Blood, UA: NEGATIVE
Glucose, UA: NEGATIVE mg/dL
Ketones, POC UA: NEGATIVE mg/dL
Leukocytes, UA: NEGATIVE
Nitrite, UA: NEGATIVE
POC PROTEIN,UA: NEGATIVE
Spec Grav, UA: 1.015 (ref 1.010–1.025)
Urobilinogen, UA: NEGATIVE E.U./dL — AB
pH, UA: 6.5 (ref 5.0–8.0)

## 2022-10-17 MED ORDER — LEVOTHYROXINE SODIUM 100 MCG PO TABS
100.0000 ug | ORAL_TABLET | Freq: Every day | ORAL | 1 refills | Status: DC
Start: 2022-10-17 — End: 2023-04-03
  Filled 2022-10-17: qty 90, 90d supply, fill #0
  Filled 2023-01-06: qty 90, 90d supply, fill #1

## 2022-10-17 NOTE — Patient Instructions (Addendum)
Change levothyroxine to 100 mcg daily and return in 6 weeks for TSH only.   Does not need to fast.  Do not take med the morning of blood draw.   It was a pleasure to see you today. Labs are stable. Would like to see TSH around 1 so am increasing Levothyroxine dose a bit.

## 2022-11-09 ENCOUNTER — Other Ambulatory Visit (HOSPITAL_COMMUNITY): Payer: Self-pay

## 2022-11-09 ENCOUNTER — Other Ambulatory Visit: Payer: Self-pay

## 2022-11-10 ENCOUNTER — Telehealth: Payer: Self-pay

## 2022-11-10 ENCOUNTER — Other Ambulatory Visit (HOSPITAL_COMMUNITY): Payer: Self-pay

## 2022-11-10 NOTE — Telephone Encounter (Signed)
*  [facility]  Pharmacy Patient Advocate Encounter  Received notification from [facility] that Prior Authorization for Nurtec 75MG  dispersible tablets  has been APPROVED from 11/10/2022 to 11/09/2023. Ran test claim, Copay is $0.00. This test claim was processed through [facility]- copay amounts may vary at other pharmacies due to pharmacy/plan contracts, or as the patient moves through the different stages of their insurance plan.   PA #/Case ID/Reference #: [id number]

## 2022-11-29 ENCOUNTER — Other Ambulatory Visit: Payer: 59

## 2022-11-29 DIAGNOSIS — E039 Hypothyroidism, unspecified: Secondary | ICD-10-CM

## 2022-11-30 LAB — TSH: TSH: 1.36 mIU/L

## 2022-12-11 ENCOUNTER — Encounter: Payer: Self-pay | Admitting: Neurology

## 2022-12-12 ENCOUNTER — Other Ambulatory Visit (HOSPITAL_COMMUNITY): Payer: Self-pay

## 2022-12-12 ENCOUNTER — Other Ambulatory Visit: Payer: Self-pay | Admitting: *Deleted

## 2022-12-12 MED ORDER — EMGALITY 120 MG/ML ~~LOC~~ SOAJ
120.0000 mg | SUBCUTANEOUS | 4 refills | Status: DC
  Filled 2022-12-12 – 2023-01-06 (×2): qty 1, 30d supply, fill #0
  Filled 2023-02-04: qty 1, 30d supply, fill #1
  Filled 2023-03-09 – 2023-03-10 (×2): qty 1, 30d supply, fill #2
  Filled 2023-04-05: qty 1, 30d supply, fill #3
  Filled 2023-05-07: qty 1, 30d supply, fill #4

## 2022-12-25 LAB — HM PAP SMEAR: HM Pap smear: NEGATIVE

## 2023-01-02 DIAGNOSIS — Z01419 Encounter for gynecological examination (general) (routine) without abnormal findings: Secondary | ICD-10-CM | POA: Diagnosis not present

## 2023-01-02 DIAGNOSIS — Z124 Encounter for screening for malignant neoplasm of cervix: Secondary | ICD-10-CM | POA: Diagnosis not present

## 2023-01-06 ENCOUNTER — Other Ambulatory Visit (HOSPITAL_COMMUNITY): Payer: Self-pay

## 2023-01-09 ENCOUNTER — Other Ambulatory Visit: Payer: Self-pay

## 2023-01-30 ENCOUNTER — Encounter: Payer: Self-pay | Admitting: Internal Medicine

## 2023-02-04 ENCOUNTER — Other Ambulatory Visit: Payer: Self-pay | Admitting: Neurology

## 2023-02-06 ENCOUNTER — Other Ambulatory Visit (HOSPITAL_COMMUNITY): Payer: Self-pay

## 2023-02-06 ENCOUNTER — Other Ambulatory Visit: Payer: Self-pay

## 2023-02-06 MED ORDER — ONDANSETRON 4 MG PO TBDP
4.0000 mg | ORAL_TABLET | Freq: Three times a day (TID) | ORAL | 3 refills | Status: DC | PRN
  Filled 2023-02-06: qty 30, 5d supply, fill #0

## 2023-02-27 ENCOUNTER — Other Ambulatory Visit (HOSPITAL_COMMUNITY): Payer: Self-pay

## 2023-02-27 ENCOUNTER — Ambulatory Visit (INDEPENDENT_AMBULATORY_CARE_PROVIDER_SITE_OTHER): Payer: 59 | Admitting: Internal Medicine

## 2023-02-27 VITALS — BP 110/80 | HR 75 | Ht 67.0 in

## 2023-02-27 DIAGNOSIS — J069 Acute upper respiratory infection, unspecified: Secondary | ICD-10-CM | POA: Diagnosis not present

## 2023-02-27 DIAGNOSIS — R059 Cough, unspecified: Secondary | ICD-10-CM | POA: Diagnosis not present

## 2023-02-27 DIAGNOSIS — H6502 Acute serous otitis media, left ear: Secondary | ICD-10-CM

## 2023-02-27 DIAGNOSIS — J029 Acute pharyngitis, unspecified: Secondary | ICD-10-CM | POA: Diagnosis not present

## 2023-02-27 LAB — POCT RAPID STREP A (OFFICE): Rapid Strep A Screen: NEGATIVE

## 2023-02-27 MED ORDER — METHYLPREDNISOLONE ACETATE 80 MG/ML IJ SUSP
80.0000 mg | Freq: Once | INTRAMUSCULAR | Status: AC
Start: 2023-02-27 — End: 2023-02-27
  Administered 2023-02-27: 80 mg via INTRAMUSCULAR

## 2023-02-27 MED ORDER — AZITHROMYCIN 250 MG PO TABS
ORAL_TABLET | ORAL | 0 refills | Status: AC
  Filled 2023-02-27: qty 6, 5d supply, fill #0

## 2023-02-27 NOTE — Progress Notes (Addendum)
Patient Care Team: Margaree Mackintosh, MD as PCP - General (Internal Medicine)  Visit Date: 02/27/23  Subjective:    Patient ID: Carla Gonzalez , Female   DOB: 02-05-1986, 37 y.o.    MRN: [id number]   37 y.o. Female presents today for sore throat since 02/20/23. Pain is worse in the evening. Started to have cough with green sputum, head congestion on 02/24/23. Reports negative at-home Covid-19 test. Wears a mask at work.  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       Review of Systems  Constitutional:  Negative for fever and malaise/fatigue.  HENT:  Positive for congestion and sore throat.   Eyes:  Negative for blurred vision.  Respiratory:  Positive for cough. Negative for shortness of breath.   Cardiovascular:  Negative for chest pain, palpitations and leg swelling.  Gastrointestinal:  Negative for vomiting.  Musculoskeletal:  Negative for back pain.  Skin:  Negative for rash.  Neurological:  Negative for loss of consciousness and headaches.        Objective:   Vitals: BP 110/80   Pulse 75   Ht 5\' 7"  (1.702 m)   LMP 02/22/2023   SpO2 98%   BMI 31.79 kg/m    Physical Exam Vitals and nursing note reviewed.  Constitutional:      General: She is not in acute distress.    Appearance: Normal appearance. She is not toxic-appearing.  HENT:     Head: Normocephalic and atraumatic.     Right Ear: Hearing, tympanic membrane, ear canal and external ear normal.     Left Ear: Hearing, ear canal and external ear normal.     Ears:     Comments: Left TM dull, pink, full. Neck:     Comments: Tenderness anterior  neck. Pulmonary:     Effort: Pulmonary effort is normal. No respiratory distress.     Breath sounds: Normal breath sounds. No wheezing or rales.  Skin:    General: Skin is warm and dry.  Neurological:     Mental Status: She is alert and oriented to person, place, and time. Mental status is at baseline.  Psychiatric:        Mood and Affect: Mood normal.        Behavior: Behavior normal.        Thought Content: Thought content normal.        Judgment: Judgment normal.       Results:   Studies obtained and personally reviewed by me:   Labs:       Component Value Date/Time   NA 136 10/13/2022 0928   K 4.3 10/13/2022 0928   CL 104 10/13/2022 0928   CO2 23 10/13/2022 0928   GLUCOSE 87 10/13/2022 0928   BUN 13 10/13/2022 0928   CREATININE 0.82 10/13/2022 0928   CALCIUM 9.3 10/13/2022 0928   PROT 7.3 10/13/2022 0928   ALBUMIN 4.6 04/26/2021 1204   AST 16 10/13/2022 0928   ALT 11 10/13/2022  1610   ALKPHOS 59 04/26/2021 1204   BILITOT 0.5 10/13/2022 0928   GFRNONAA >60 09/10/2021 1221   GFRNONAA 115 09/03/2020 1235   GFRAA 134 09/03/2020 1235     Lab Results  Component Value Date   WBC 8.2 10/13/2022   HGB 14.2 10/13/2022   HCT 42.5 10/13/2022   MCV 85.7 10/13/2022   PLT 290 10/13/2022    Lab Results  Component Value Date   CHOL 148 10/13/2022   HDL 55 10/13/2022   LDLCALC 79 10/13/2022   TRIG 64 10/13/2022   CHOLHDL 2.7 10/13/2022    Lab Results  Component Value Date   HGBA1C 5.5 08/18/2019     Lab Results  Component Value Date   TSH 1.36 11/29/2022      Assessment & Plan:   Acute upper respiratory infection / left serous otitis media: strep test negative today. Administered Depo-Medrol 80 mg IM. Prescribed Z-Pak two tabs day 1 followed by one tab days 2-5. Get plenty of rest and stay well-hydrated. Contact us if symptoms worsen or fail to improve.    I,Alexander Ruley,acting as a Neurosurgeon for Margaree Mackintosh, MD.,have documented all relevant  documentation on the behalf of Margaree Mackintosh, MD,as directed by  Margaree Mackintosh, MD while in the presence of Margaree Mackintosh, MD.  I, Margaree Mackintosh, MD, have reviewed all documentation for this visit. The documentation on 03/07/23 for the exam, diagnosis, procedures, and orders are all accurate and complete.

## 2023-03-07 ENCOUNTER — Encounter: Payer: Self-pay | Admitting: Internal Medicine

## 2023-03-07 NOTE — Patient Instructions (Addendum)
You have been diagnosed with with an acute upper respiratory infection and left serous otitis media.  Given Depo-Medrol 80 mg IM today.  Prescribe Zithromax Z-PAK 2 tabs day 1 followed by 1 tab days 2 through 5.  Rest and stay well-hydrated and contact us if symptoms worsen or fail to improve.  We are sorry you are not feeling well.

## 2023-03-09 ENCOUNTER — Encounter (HOSPITAL_COMMUNITY): Payer: Self-pay

## 2023-03-09 ENCOUNTER — Other Ambulatory Visit (HOSPITAL_COMMUNITY): Payer: Self-pay

## 2023-03-10 ENCOUNTER — Other Ambulatory Visit (HOSPITAL_COMMUNITY): Payer: Self-pay

## 2023-04-03 ENCOUNTER — Other Ambulatory Visit: Payer: Self-pay | Admitting: Internal Medicine

## 2023-04-03 ENCOUNTER — Other Ambulatory Visit (HOSPITAL_COMMUNITY): Payer: Self-pay

## 2023-04-03 MED ORDER — LEVOTHYROXINE SODIUM 100 MCG PO TABS
100.0000 ug | ORAL_TABLET | Freq: Every day | ORAL | 1 refills | Status: DC
  Filled 2023-04-03: qty 90, 90d supply, fill #0
  Filled 2023-06-27: qty 90, 90d supply, fill #1

## 2023-04-06 ENCOUNTER — Other Ambulatory Visit: Payer: Self-pay

## 2023-05-11 ENCOUNTER — Encounter: Payer: Self-pay | Admitting: Neurology

## 2023-05-11 ENCOUNTER — Other Ambulatory Visit (HOSPITAL_COMMUNITY): Payer: Self-pay

## 2023-05-11 ENCOUNTER — Ambulatory Visit: Payer: 59 | Admitting: Neurology

## 2023-05-11 VITALS — BP 110/66 | HR 57 | Ht 68.0 in | Wt 208.0 lb

## 2023-05-11 DIAGNOSIS — G43709 Chronic migraine without aura, not intractable, without status migrainosus: Secondary | ICD-10-CM

## 2023-05-11 DIAGNOSIS — G43009 Migraine without aura, not intractable, without status migrainosus: Secondary | ICD-10-CM

## 2023-05-11 MED ORDER — ONDANSETRON 4 MG PO TBDP
4.0000 mg | ORAL_TABLET | Freq: Three times a day (TID) | ORAL | 11 refills | Status: AC | PRN
  Filled 2023-05-11: qty 30, 5d supply, fill #0

## 2023-05-11 MED ORDER — EMGALITY 120 MG/ML ~~LOC~~ SOAJ
120.0000 mg | SUBCUTANEOUS | 11 refills | Status: AC
  Filled 2023-05-11: qty 1.12, 34d supply, fill #0
  Filled 2023-08-10: qty 1, 30d supply, fill #0
  Filled 2023-09-05: qty 1, 30d supply, fill #1
  Filled 2023-10-02: qty 1, 30d supply, fill #2
  Filled 2023-11-17: qty 1, 30d supply, fill #3
  Filled 2023-12-17: qty 1, 30d supply, fill #4
  Filled 2024-01-17: qty 1, 30d supply, fill #5
  Filled 2024-02-13: qty 1, 30d supply, fill #6
  Filled 2024-03-08: qty 1, 30d supply, fill #7
  Filled 2024-04-01: qty 1, 30d supply, fill #8

## 2023-05-11 MED ORDER — RIZATRIPTAN BENZOATE 10 MG PO TBDP
10.0000 mg | ORAL_TABLET | ORAL | 11 refills | Status: AC | PRN
  Filled 2023-05-11: qty 9, 31d supply, fill #0

## 2023-05-11 MED ORDER — NURTEC 75 MG PO TBDP
75.0000 mg | ORAL_TABLET | Freq: Every day | ORAL | 11 refills | Status: AC | PRN
  Filled 2023-05-11: qty 16, 30d supply, fill #0

## 2023-05-11 NOTE — Progress Notes (Signed)
 [facility]     CC:  migraines  05/11/2023: Still doing EXCELLENT on emgality(has made her a different person she states, great success story). Prior to [employer] she had . >> 8 mod to severe migraine days a month and >>15 total headache days a month . Emgality improved her frequency > 50% and her severity > 90%.  she had Now have 4  migraine days a month and < 6 total headache days a month failed imitrex and maxalt continue nurtec. No other focal neurologic deficits, associated symptoms, inciting events or modifiable factors.  Patient complains of symptoms per HPI as well as the following symptoms: none . Pertinent negatives and positives per HPI. All others negative   05/11/2022: She took nurtec a few times 4 pills the last month, still did well had a few more migraines but overall emgality still working great, but if the migraines worsen I will prescribe ajovy. Can load with ajovy 3 injections and switch over.   Medication tried include: Verapamil(propranolol contraindicated due to hypotensiom), Topamax, amitriptyline. Sumatriptan. Rizatroiptan.Emgality, aimovig contraindicated due to constipation.   04/25/2022 update: doing great! Now have <6 migraine days a month and < 8 total headache days a month failed imitrex and maxalt will order nurtec.   01/10/2022: She is still having migraines, with vertigo, she tried to treat her vestibular symptoms as sinus congestion and eustacian tube, did not work, worse with her cycles, by conservative measures, she has been vestibular therapy and done Epley maneuvers, she still has migrainous headaches, they can be unilateral or holocephalic, pulsating pounding throbbing, photophobia phonophobia, nausea, hurts to move, they can last 40 to 72 hours and be moderate to severe, it is affecting her life and her job, no medication overuse, she has vertigo which is associated with the migraines but I do not believe that this is an aura.  We have tried to  treat her as sinus congestion, she has done Nettie pot, decongestions, Epley maneuvers, therapy, meclizine, verapamil,.  At this point we need to treat her for migraines with vertigo. The vertigo comes with the sever migraines, it is does not proceed it or is not a harbinger of her migraines which why I do not think this is an aura, but we did talk about migraine with aura and risk of stroke. She has at least for the last 6 months has > 8 mod to severe migraine days a month and > 15 total headache days a month.No other focal neurologic deficits, associated symptoms, inciting events or modifiable factors.     Personally reviewed images and agree:  CLINICAL DATA:  Dizziness, persistent/recurrent, cardiac or vascular cause suspected; Neuro deficit, acute, stroke suspected; Neuro deficit, acute, stroke suspected vertigo   EXAM: MRI HEAD WITHOUT CONTRAST   MRA HEAD WITHOUT CONTRAST   MRA NECK WITHOUT CONTRAST   TECHNIQUE: Multiplanar, multiecho pulse sequences of the brain and surrounding structures were obtained without intravenous contrast. Angiographic images of the Circle of Willis were obtained using MRA technique without intravenous contrast. Angiographic images of the neck were obtained using MRA technique without intravenous contrast. Carotid stenosis measurements (when applicable) are obtained utilizing NASCET criteria, using the distal internal carotid diameter as the denominator.   COMPARISON:  None Available.   FINDINGS: MRI HEAD FINDINGS   Brain: No acute infarction, hemorrhage, hydrocephalus, extra-axial collection or mass lesion.   Vascular: Detailed below.   Skull and upper cervical spine: Normal marrow signal.   Sinuses/Orbits: Largely clear sinuses.  No acute orbital  findings.   Other: No mastoid effusions.   MRA HEAD FINDINGS   Anterior circulation: Bilateral intracranial ICAs, MCAs, and ACAs are patent without proximal hemodynamically significant stenosis.  No aneurysm identified.   Posterior circulation: Bilateral intradural vertebral arteries, basilar artery and posterior cerebral arteries are patent without proximal hemodynamically significant stenosis. No aneurysm identified.   MRA NECK FINDINGS   Carotid systems: The common carotid arteries and internal carotid is are patent bilaterally without evidence of significant (greater than 50%) stenosis.   Vertebral arteries: Left dominant. Both vertebral arteries are patent without evidence of significant (greater than 50%) stenosis.   IMPRESSION: 1. No evidence of acute intracranial abnormality. 2. No large vessel occlusion or proximal hemodynamically significant stenosis in the head or neck.  Patient complains of symptoms per HPI as well as the following symptoms: migraine . Pertinent negatives and positives per HPI. All others negative   HPI:  Carla Gonzalez is a 38 y.o. female here as requested by Margaree Mackintosh, MD for vertigo. PMHx fragile X chromosome, dilated pulmonary artery, symptomatic bradycardia.  I reviewed Dr. 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  She has rightward nystagmus probably BPPV which may be aggravated by stress, comes on without warning and alarming to her makes her feel a bit out of control, she has a stressful job, she does not have tinnitus with these episodes, they also gave her Antivert.   She had her youngest daughter in June, she had a lot of problems with bradycardia and flushing and palpitations, a few months later she would get dizzy standing in the  kitchen, she was diagnosed with hypothyroidism, dizziness was better on synthroid, it was bothering her at work, she felt imbalanced, the room would feel like it would get narrow and dark, 2 months ago her dizziness came back, just the dizziness, dizziness if she turns her head too much or even with sitting, meclizine gave her side effects, she feels like she is on a ride, she feels she is moving but the room is not, movement when there isn't. A few episodes she felt lightheaded and her vision narrowed. Her kids are sick constantly. She has been on antibitotics. She went to the eye doctor. No headaches. Decreased hearing on the right side like in a tunnel. No other focal neurologic deficits, associated symptoms, inciting events or modifiable factors.  Medications tried; Verapamil,   Reviewed notes, labs and imaging from outside physicians, which showed: see above  Review of Systems: Patient complains of symptoms per HPI as well as the following symptoms dizziness. Pertinent negatives and positives per HPI. All others negative.   Social History   Socioeconomic History   Marital status: Married    Spouse name: Not on file   Number of children: Not on file   Years of education: Not on file   Highest education level: Master's degree (e.g., MA, MS, MEng, MEd, MSW, MBA)  Occupational History   Not on file  Tobacco Use   Smoking status: Never   Smokeless tobacco: Never  Vaping Use   Vaping status: Never Used  Substance and Sexual Activity   Alcohol use: Not Currently  Drug use: Never   Sexual activity: Yes    Birth control/protection: Pill  Other Topics Concern   Not on file  Social History Narrative   Works as cardiology NP.  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    Social Drivers of Corporate investment banker Strain: Low Risk  (02/27/2023)   Overall Financial Resource Strain (CARDIA)    Difficulty of Paying Living Expenses: Not hard at all  Food Insecurity: No Food Insecurity (02/27/2023)   Hunger  Vital Sign    Worried About Running Out of Food in the Last Year: Never true    Ran Out of Food in the Last Year: Never true  Transportation Needs: No Transportation Needs (02/27/2023)   PRAPARE - Administrator, Civil Service (Medical): No    Lack of Transportation (Non-Medical): No  Physical Activity: Sufficiently Active (02/27/2023)   Exercise Vital Sign    Days of Exercise per Week: 5 days    Minutes of Exercise per Session: 30 min  Stress: No Stress Concern Present (02/27/2023)   [employer] of Occupational Health - Occupational Stress Questionnaire    Feeling of Stress : Only a little  Social Connections: Socially Integrated (02/27/2023)   Social Connection and Isolation Panel [NHANES]    Frequency of Communication with Friends and Family: More than three times a week    Frequency of Social Gatherings with Friends and Family: Once a week    Attends Religious Services: More than 4 times per year    Active Member of Clubs or Organizations: Yes    Attends Engineer, structural: More than 4 times per year    Marital Status: Married  Catering manager Violence: Not on file    Family History  Problem Relation Age of Onset   Hypertension Father    Melanoma Father    Migraines Neg Hx    Colon cancer Neg Hx    Stomach cancer Neg Hx    Headache Neg Hx     Past Medical History:  Diagnosis Date   Hypothyroidism    Pectus excavatum    Renal cyst 2023   left; benign appearing    Patient Active Problem List   Diagnosis Date Noted   Migraine without aura and without status migrainosus, not intractable 05/11/2022   Chronic migraine without aura without status migrainosus, not intractable 01/10/2022   Symptomatic bradycardia 08/18/2019   Dilated pulmonary artery 08/18/2019   Cesarean delivery due to previous difficult delivery, delivered, current hospitalization 08/12/2019   Fetal malpresentation, delivered, current hospitalization 08/12/2019   Fragile x  chromosome 04/04/2016    Past Surgical History:  Procedure Laterality Date   APPENDECTOMY     CESAREAN SECTION N/A 08/12/2019   Procedure: CESAREAN SECTION;  Surgeon: Marlow Baars, MD;  Location: [facility] LD ORS;  Service: Obstetrics;  Laterality: N/A;   Grommet     TONSILLECTOMY AND ADENOIDECTOMY  2009    Current Outpatient Medications  Medication Sig Dispense Refill   Galcanezumab-gnlm (EMGALITY) 120 MG/ML SOAJ Inject 120 mg into the skin every 30 (thirty) days. Please use copay card: BIN [id number] PCN PDMI GRP [id number] ID [id number] EXP 03/06/2024 1.12 mL 11   levothyroxine (SYNTHROID) 100 MCG tablet Take 1 tablet (100 mcg total) by mouth daily. 90 tablet 1   ondansetron (ZOFRAN-ODT) 4 MG disintegrating tablet Take 1-2 tablets (4-8 mg total) by mouth every 8 (eight) hours as needed. 30 tablet 11   Rimegepant Sulfate (NURTEC) 75 MG TBDP Take 1 tablet (75 mg total)  by mouth daily as needed. For migraines. Take as close to onset of migraine as possible. One daily maximum. Please use copay card: BIN# [id number]  PCN# CN  GRP# [id number]  ID#  [id number] EXP 03/06/2024 16 tablet 11   rizatriptan (MAXALT-MLT) 10 MG disintegrating tablet Take 1 tablet (10 mg total) by mouth as needed for migraine. May repeat in 2 hours if needed 9 tablet 11   No current facility-administered medications for this visit.    Allergies as of 05/11/2023 - Review Complete 05/11/2023  Allergen Reaction Noted   Sulfa antibiotics Hives 09/01/2011    Vitals: BP 110/66 (BP Location: Left Arm, Patient Position: Sitting, Cuff Size: Normal)   Pulse (!) 57   Ht 5\' 8"  (1.727 m)   Wt 208 lb (94.3 kg)   BMI 31.63 kg/m  Last Weight:  Wt Readings from Last 1 Encounters:  05/11/23 208 lb (94.3 kg)   Last Height:   Ht Readings from Last 1 Encounters:  05/11/23 5\' 8"  (1.727 m)    Exam: NAD, pleasant                  Speech:    Speech is normal; fluent and spontaneous with normal comprehension.  Cognition:    The patient is  oriented to person, place, and time;     recent and remote memory intact;     language fluent;    Cranial Nerves:    The pupils are equal, round, and reactive to light.Trigeminal sensation is intact and the muscles of mastication are normal. The face is symmetric. The palate elevates in the midline. Hearing intact. Voice is normal. Shoulder shrug is normal. The tongue has normal motion without fasciculations.   Coordination:  No dysmetria  Motor Observation:    No asymmetry, no atrophy, and no involuntary movements noted. Tone:    Normal muscle tone.     Strength:    Strength is V/V in the upper and lower limbs.      Sensation: intact to LT     Assessment/Plan:  Lovely 38 year old patient with chronic migraines with vertigo.   Medication tried include: Verapamil(propranolol and any other BP medications contraindicated due to hypotensiom), Topamax, amitriptyline. Sumatriptan, gabapentin, effexor. Rizatroiptan.Emgality, aimovig contraindicated due to constipation.   Still doing EXCELLENT on emgality(has made her a different person she states, great success story). Prior to [employer] she had . >> 8 mod to severe migraine days a month and >>15 total headache days a month . Emgality improved her frequency > 50% and her severity > 90%: Continue Emgality  Currentlyshe has 4 migraine days a month and < 6 total headache days a month failed imitrex and maxalt continue nurtec. She is not having any children, husband has had children and uses backup, discussed teratogenicity Signed up for copay cards, placed in prescriptions  Meds ordered this encounter  Medications   Galcanezumab-gnlm (EMGALITY) 120 MG/ML SOAJ    Sig: Inject 120 mg into the skin every 30 (thirty) days. Please use copay card: BIN [id number] PCN PDMI GRP [id number] ID [id number] EXP 03/06/2024    Dispense:  1.12 mL    Refill:  11    Please use copay card: BIN [id number] PCN PDMI GRP [id number] ID [id number] EXP 03/06/2024   Rimegepant  Sulfate (NURTEC) 75 MG TBDP    Sig: Take 1 tablet (75 mg total) by mouth daily as needed. For migraines. Take as close to onset of migraine as possible. One daily maximum. Please use copay  card: BIN# [id number]  PCN# CN  GRP# [id number]  ID#  [id number] EXP 03/06/2024    Dispense:  16 tablet    Refill:  11    Please use copay card: BIN# [id number]  PCN# CN  GRP# [id number]  ID#  [id number] EXP 03/06/2024   ondansetron (ZOFRAN-ODT) 4 MG disintegrating tablet    Sig: Take 1-2 tablets (4-8 mg total) by mouth every 8 (eight) hours as needed.    Dispense:  30 tablet    Refill:  11   rizatriptan (MAXALT-MLT) 10 MG disintegrating tablet    Sig: Take 1 tablet (10 mg total) by mouth as needed for migraine. May repeat in 2 hours if needed    Dispense:  9 tablet    Refill:  11    For allergies in the past recommended:   Medrol dosepak :Inner ear inflammation/fluid behind right ear: Kids have bee sick, maybe viral. She has been on antibiotics. She reports pressure behind ear. Try medrol dosepak OTC Flonase or other nasal steroidal spray daily for at least 4 weeks: may help with inner ear fluid and inflammation along with the medrol dosepak Netti Pot with distilled water daily: watched online video can help with congestion and sinus issues Decongestant ZyrtecD for example for several weeks Consider MRI brain if no resolution with above Vestibular rehab for Epley Maneuver teaching but I don;t think this is crystals dislodged(IE BPPV), will order in case above does not resolve Consider ENT/Allergy. She has some fluid behind the ears. Consider ear tubes? She had them as a child, may consider as last resort  Cc: Baxley, Luanna Cole, MD,  Baxley, Luanna Cole, MD  Naomie Dean, MD  [facility] [address] [place], [place] [address]  Phone [phone] Fax [phone]  I spent 15 minutes of face-to-face and non-face-to-face time with patient on the  1. Chronic migraine without  aura without status migrainosus, not intractable   2. Migraine without aura and without status migrainosus, not intractable    diagnosis.  This included previsit chart review, lab review, study review, order entry, electronic health record documentation, patient education on the different diagnostic and therapeutic options, counseling and coordination of care, risks and benefits of management, compliance, or risk factor reduction

## 2023-05-23 ENCOUNTER — Other Ambulatory Visit (HOSPITAL_COMMUNITY): Payer: Self-pay

## 2023-07-03 ENCOUNTER — Other Ambulatory Visit (HOSPITAL_COMMUNITY): Payer: Self-pay

## 2023-07-03 ENCOUNTER — Telehealth: Payer: Self-pay

## 2023-07-03 NOTE — Telephone Encounter (Signed)
 Pharmacy Patient Advocate Encounter   Received notification from CoverMyMeds that prior authorization for Emgality  120MG /ML auto-injectors (migraine) is required/requested.   Insurance verification completed.   The patient is insured through [facility] .   Per test claim: PA required; PA submitted to above mentioned insurance via CoverMyMeds Key/confirmation #/EOC [id number] Status is pending

## 2023-07-05 ENCOUNTER — Other Ambulatory Visit (HOSPITAL_COMMUNITY): Payer: Self-pay

## 2023-07-05 NOTE — Telephone Encounter (Signed)
 Pharmacy Patient Advocate Encounter  Received notification from [facility] that Prior Authorization for Emgality  120MG /ML auto-injectors (migraine) has been APPROVED from 07/04/2023 to 07/02/2024. Ran test claim, Copay is $35.00. This test claim was processed through [facility]- copay amounts may vary at other pharmacies due to pharmacy/plan contracts, or as the patient moves through the different stages of their insurance plan.   PA #/Case ID/Reference #: [id number]

## 2023-08-03 ENCOUNTER — Other Ambulatory Visit (HOSPITAL_COMMUNITY): Payer: Self-pay

## 2023-08-03 ENCOUNTER — Ambulatory Visit: Admitting: Internal Medicine

## 2023-08-03 ENCOUNTER — Encounter: Payer: Self-pay | Admitting: Internal Medicine

## 2023-08-03 VITALS — BP 120/80 | HR 84 | Ht 68.0 in | Wt 208.0 lb

## 2023-08-03 DIAGNOSIS — R059 Cough, unspecified: Secondary | ICD-10-CM | POA: Diagnosis not present

## 2023-08-03 DIAGNOSIS — J029 Acute pharyngitis, unspecified: Secondary | ICD-10-CM | POA: Diagnosis not present

## 2023-08-03 DIAGNOSIS — R509 Fever, unspecified: Secondary | ICD-10-CM | POA: Diagnosis not present

## 2023-08-03 LAB — POC COVID19/FLU A&B COMBO
Covid Antigen, POC: NEGATIVE
Influenza A Antigen, POC: NEGATIVE
Influenza B Antigen, POC: NEGATIVE

## 2023-08-03 MED ORDER — AZITHROMYCIN 250 MG PO TABS
ORAL_TABLET | ORAL | 0 refills | Status: AC
  Filled 2023-08-03: qty 6, 5d supply, fill #0

## 2023-08-03 MED ORDER — FLUCONAZOLE 150 MG PO TABS
150.0000 mg | ORAL_TABLET | Freq: Once | ORAL | 0 refills | Status: AC
  Filled 2023-08-03: qty 1, 1d supply, fill #0

## 2023-08-03 MED ORDER — METHYLPREDNISOLONE ACETATE 80 MG/ML IJ SUSP
80.0000 mg | Freq: Once | INTRAMUSCULAR | Status: AC
  Administered 2023-08-03: 80 mg via INTRAMUSCULAR

## 2023-08-03 NOTE — Progress Notes (Shared)
 Patient Care Team: Sylvan Evener, MD as PCP - General (Internal Medicine)  Visit Date: 08/03/23  Subjective:  Patient Carla Gonzalez DOB:04/08/1985,37 y.o. XBJ:[id number]  Chief Complaint  Patient presents with   Cough   Nasal Congestion   Fever  37 y.o.Female presents today for acute sick visit with Cough; Nasal Congestion; Fever; along with sore throat and myalgias. Says that last week her youngest child was sick; both her and her husband tested themselves for Covid-19 at-home which was negative, which was also negative in-office along with the Flu A/B.   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   Review of Systems  Constitutional:  Positive for fever (102 highest) and malaise/fatigue.  HENT:  Positive for congestion (nasal) and sore throat.   Respiratory:  Positive for cough and sputum production.   Musculoskeletal:  Positive for myalgias.   Objective:  Vitals: BP 120/80   Pulse 84   Ht 5\' 8"  (1.727 m)   Wt 208 lb (94.3 kg)   LMP 07/03/2023   SpO2 97%   BMI 31.63 kg/m  Physical Exam Vitals and nursing note reviewed.  Constitutional:      General: She is not in acute distress.    Appearance: She is ill-appearing.  HENT:     Head: Normocephalic and atraumatic.     Right Ear: Ear canal and external ear normal. Tympanic membrane is retracted. Tympanic membrane is not erythematous.     Left Ear: Ear canal and external ear normal. Tympanic membrane is erythematous (pink) and bulging.     Mouth/Throat:     Mouth: Mucous membranes are moist.      Pharynx: Posterior oropharyngeal erythema present. No oropharyngeal exudate.  Pulmonary:     Effort: Pulmonary effort is normal.     Breath sounds: Normal breath sounds. No wheezing, rhonchi or rales.  Lymphadenopathy:     Cervical: No cervical adenopathy.  Skin:    General: Skin is warm and dry.  Neurological:     Mental Status: She is alert and oriented to person, place, and time. Mental status is at baseline.  Psychiatric:        Mood and Affect: Mood normal.        Behavior: Behavior normal.        Thought Content: Thought content normal.        Judgment: Judgment normal.     Results:  Studies Obtained And Personally Reviewed By Me: Labs:     Component Value Date/Time   NA 136 10/13/2022 0928   K 4.3 10/13/2022 0928   CL 104 10/13/2022 0928   CO2 23 10/13/2022 0928   GLUCOSE 87 10/13/2022 0928   BUN 13 10/13/2022 0928   CREATININE 0.82 10/13/2022 0928   CALCIUM 9.3 10/13/2022 0928   PROT 7.3 10/13/2022 0928   ALBUMIN 4.6 04/26/2021 1204   AST 16 10/13/2022 0928   ALT 11 10/13/2022 0928   ALKPHOS 59 04/26/2021 1204  BILITOT 0.5 10/13/2022 0928   GFRNONAA >60 09/10/2021 1221   GFRNONAA 115 09/03/2020 1235   GFRAA 134 09/03/2020 1235    Lab Results  Component Value Date   WBC 8.2 10/13/2022   HGB 14.2 10/13/2022   HCT 42.5 10/13/2022   MCV 85.7 10/13/2022   PLT 290 10/13/2022   Lab Results  Component Value Date   CHOL 148 10/13/2022   HDL 55 10/13/2022   LDLCALC 79 10/13/2022   TRIG 64 10/13/2022   CHOLHDL 2.7 10/13/2022   Lab Results  Component Value Date   HGBA1C 5.5 08/18/2019    Lab Results  Component Value Date   TSH 1.36 11/29/2022    Results for orders placed or performed in visit on 08/03/23  POC Covid19/Flu A&B Antigen  Result Value Ref Range   Influenza A Antigen, POC Negative Negative   Influenza B Antigen, POC Negative Negative   Covid Antigen, POC Negative Negative   Assessment & Plan:   Meds ordered this encounter  Medications    azithromycin  (ZITHROMAX ) 250 MG tablet    Sig: Take 2 tablets (500 mg total) by mouth daily for 1 day, THEN 1 tablet (250 mg total) daily for 4 days.    Dispense:  6 tablet    Refill:  0   fluconazole  (DIFLUCAN ) 150 MG tablet    Sig: Take 1 tablet (150 mg total) by mouth once for 1 dose.    Dispense:  1 tablet    Refill:  0   methylPREDNISolone  acetate (DEPO-MEDROL ) injection 80 mg   Orders Placed This Encounter  Procedures   POC Covid19/Flu A&B Antigen   Acute Pharyngitis: In-office Covid-19/Flu A&B negative. Last week her child was ill and this week patient started to have similar symptoms. Given Depo-Medrol  80 mg IM today in-office. Sending in 250 mg Azithromycin  - take 2 tablets on Day 1 and 1 tablet on Days 2-5 and 150 mg Diflucan  in case she develops Candida vaginitis. Stay well rested, well hydrated, and well nourished. Walk around some to prevent atelectasis. Contact us  if symptoms worsen/persist despite treatment.          I,Emily Lagle,acting as a Neurosurgeon for Sylvan Evener, MD.,have documented all relevant documentation on the behalf of Sylvan Evener, MD,as directed by  Sylvan Evener, MD while in the presence of Sylvan Evener, MD.   I, Sylvan Evener, MD, have reviewed all documentation for this visit. The documentation on 08/04/23 for the exam, diagnosis, procedures, and orders are all accurate and complete.

## 2023-08-04 ENCOUNTER — Encounter: Payer: Self-pay | Admitting: Internal Medicine

## 2023-08-04 NOTE — Patient Instructions (Addendum)
 Covid and flu tests are negative. Given Depomedrol IM in office today. Take Zithromax  Z-pak as directed. Rest and stay well hydrated. May take Diflucan  if needed for Candida vaginitis.Out of work until Monday due to likelihood of being contagious, malaise, and fatigue.

## 2023-08-09 ENCOUNTER — Ambulatory Visit
Admission: RE | Admit: 2023-08-09 | Discharge: 2023-08-09 | Disposition: A | Discharge status: Home / Self Care | Source: Ambulatory Visit | Attending: Internal Medicine | Admitting: Internal Medicine

## 2023-08-09 ENCOUNTER — Other Ambulatory Visit (HOSPITAL_COMMUNITY): Payer: Self-pay

## 2023-08-09 ENCOUNTER — Encounter: Payer: Self-pay | Admitting: Internal Medicine

## 2023-08-09 ENCOUNTER — Ambulatory Visit (INDEPENDENT_AMBULATORY_CARE_PROVIDER_SITE_OTHER): Admitting: Internal Medicine

## 2023-08-09 ENCOUNTER — Telehealth: Payer: Self-pay

## 2023-08-09 VITALS — BP 110/80 | HR 76 | Temp 98.7°F | Ht 68.0 in | Wt 208.0 lb

## 2023-08-09 DIAGNOSIS — H6692 Otitis media, unspecified, left ear: Secondary | ICD-10-CM | POA: Diagnosis not present

## 2023-08-09 DIAGNOSIS — R0989 Other specified symptoms and signs involving the circulatory and respiratory systems: Secondary | ICD-10-CM

## 2023-08-09 DIAGNOSIS — J22 Unspecified acute lower respiratory infection: Secondary | ICD-10-CM | POA: Diagnosis not present

## 2023-08-09 DIAGNOSIS — R051 Acute cough: Secondary | ICD-10-CM | POA: Diagnosis not present

## 2023-08-09 DIAGNOSIS — R058 Other specified cough: Secondary | ICD-10-CM

## 2023-08-09 MED ORDER — CEFTRIAXONE SODIUM 1 G IJ SOLR
1.0000 g | Freq: Once | INTRAMUSCULAR | Status: AC
  Administered 2023-08-09: 1 g via INTRAMUSCULAR

## 2023-08-09 MED ORDER — LEVOFLOXACIN 500 MG PO TABS
500.0000 mg | ORAL_TABLET | Freq: Every day | ORAL | 0 refills | Status: AC
  Filled 2023-08-09: qty 7, 7d supply, fill #0

## 2023-08-09 MED ORDER — BENZONATATE 100 MG PO CAPS
100.0000 mg | ORAL_CAPSULE | Freq: Three times a day (TID) | ORAL | 0 refills | Status: DC | PRN
  Filled 2023-08-09: qty 20, 7d supply, fill #0

## 2023-08-09 NOTE — Patient Instructions (Addendum)
 Patient to have x-ray today to determine if she has pneumonia.  Rales are heard in left lower lobe today.  She will take Levaquin  500 mg daily with a meal for 7 days.  Given 1 g IM Rocephin  in the office today.  May take Tessalon  Perles up to 3 times daily if needed for cough.  Rest and stay well-hydrated.

## 2023-08-09 NOTE — Progress Notes (Signed)
   Subjective:    Patient ID: Carla Gonzalez, female    DOB: 08/07/1985, 38 y.o.   MRN: [id number]  HPI Returns today for persistent respiratory infection symptoms with cough, malaise and fatigue.  Was seen on May 29 here for an acute sick visit with cough nasal congestion fever sore throat myalgias.  Youngest child had been sick.  Her husband was also sick.  She and her husband  tested negative for COVID-19 at home.  She was checked here for COVID-19 and influenza on May 29 both of which were negative.  She was treated with Zithromax  Z-PAK for symptoms of acute nasal congestion sore throat and myalgias.  Was given methylprednisolone  80 mg IM in the office and prescribed Diflucan  if needed for Candida vaginitis.  Patient returned to work within a few days and has still not felt well.  Continues to cough.  Husband has improved and her daughter who has similar symptoms as improved.    Review of Systems No tachypnea     Objective:   Physical Exam BP 100/80, pulse 76 regular, T 98.7 degrees, pulse ox 97% on room air   Resting comfortably in chair.  Respiratory rate is normal  Left TM is dull not red but has splayed light reflex.  Pharynx is slightly injected without exudate.  Neck is supple without adenopathy.  Has coarse breath sounds in left lower lobe but no wheezing.     Assessment & Plan:   Persistent lower respiratory infection-rule out pneumonia Acute left otitis media  Plan: Levaquin  500 mg daily with a meal x 7 days. Rocephin  One gram IM given in office. Have CXR to check for possible pneumonia. Rest and stay well hydrated. May take Tessalon  perles up to 3 times daily as needed for cough. Rest and stay well hydrated.

## 2023-08-09 NOTE — Addendum Note (Signed)
 Addended by: Ghazi Rumpf P on: 08/09/2023 12:33 PM   Modules accepted: Orders

## 2023-08-09 NOTE — Telephone Encounter (Signed)
 Patient called states she has completed the zpak and she feels like it's all deep in her chest. She wants to know if you want to her again or just call something else in?

## 2023-08-10 ENCOUNTER — Ambulatory Visit: Payer: Self-pay | Admitting: Internal Medicine

## 2023-08-11 ENCOUNTER — Other Ambulatory Visit (HOSPITAL_COMMUNITY): Payer: Self-pay

## 2023-08-15 ENCOUNTER — Other Ambulatory Visit (HOSPITAL_COMMUNITY): Payer: Self-pay

## 2023-09-05 ENCOUNTER — Other Ambulatory Visit (HOSPITAL_COMMUNITY): Payer: Self-pay

## 2023-10-02 ENCOUNTER — Encounter (HOSPITAL_COMMUNITY): Payer: Self-pay

## 2023-10-02 ENCOUNTER — Other Ambulatory Visit: Payer: Self-pay | Admitting: Internal Medicine

## 2023-10-02 ENCOUNTER — Other Ambulatory Visit (HOSPITAL_COMMUNITY): Payer: Self-pay

## 2023-10-02 MED ORDER — LEVOTHYROXINE SODIUM 100 MCG PO TABS
100.0000 ug | ORAL_TABLET | Freq: Every day | ORAL | 1 refills | Status: DC
  Filled 2023-10-02: qty 90, 90d supply, fill #0
  Filled 2024-01-01: qty 90, 90d supply, fill #1

## 2023-10-30 ENCOUNTER — Telehealth: Payer: Self-pay | Admitting: Pharmacist

## 2023-10-30 NOTE — Telephone Encounter (Signed)
 Pharmacy Patient Advocate Encounter   Received notification from CoverMyMeds that prior authorization for Nurtec 75MG  dispersible tablets is required/requested.   Insurance verification completed.   The patient is insured through [facility] .   Per test claim: PA required; PA submitted to above mentioned insurance via Latent Key/confirmation #/EOC [id number] Status is pending

## 2023-10-31 ENCOUNTER — Other Ambulatory Visit (HOSPITAL_COMMUNITY): Payer: Self-pay

## 2023-10-31 NOTE — Telephone Encounter (Signed)
 Pharmacy Patient Advocate Encounter  Received notification from [employer] that Prior Authorization for Nurtec has been APPROVED from 11/11/2023 to 05/09/2024. Ran test claim, Copay is $0. This test claim was processed through [facility]- copay amounts may vary at other pharmacies due to pharmacy/plan contracts, or as the patient moves through the different stages of their insurance plan.   PA #/Case ID/Reference #: [id number]

## 2023-11-11 IMAGING — US US ABDOMEN LIMITED
1 series · 14 of 25 positions shown · non-contrast
Comparison: None.

CLINICAL DATA: Pain right upper quadrant

EXAM:
ULTRASOUND ABDOMEN LIMITED RIGHT UPPER QUADRANT

[Series 1: us abdomen limited ruq (liver/gb) · 14 of 34 slices shown]
[im 1/34]
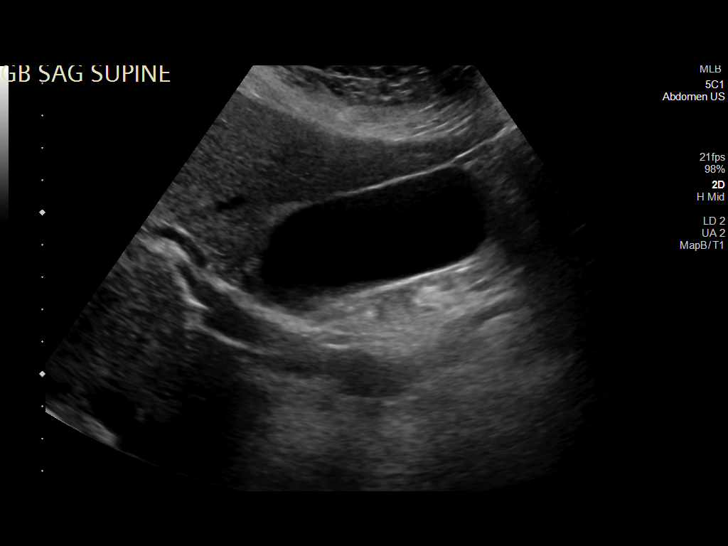
[im 3/34]
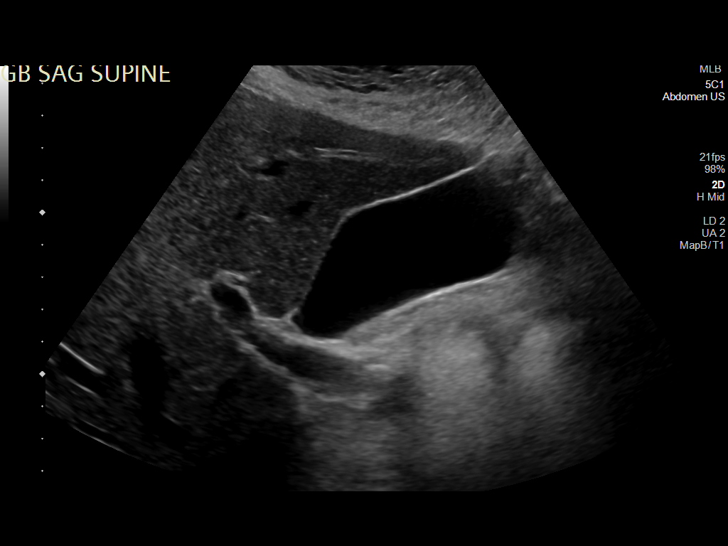
[im 6/34]
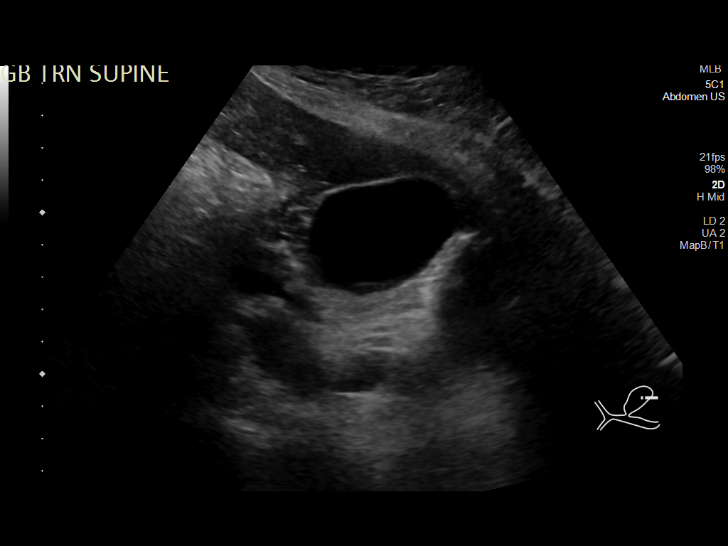
[im 9/34]
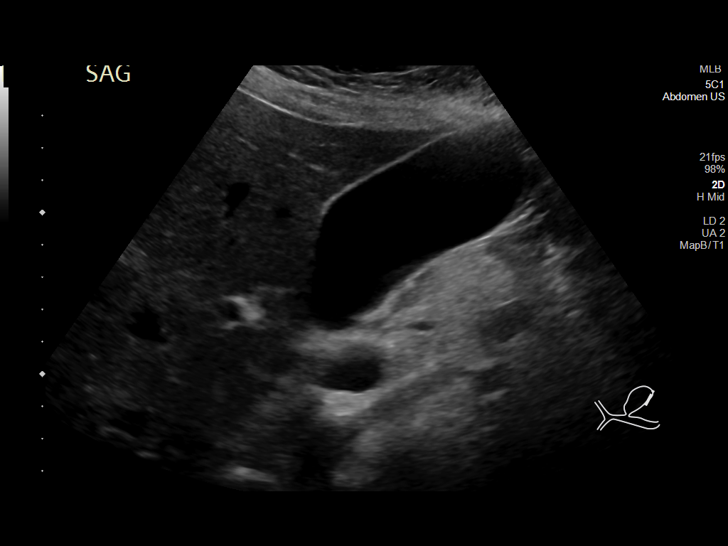
[im 12/34]
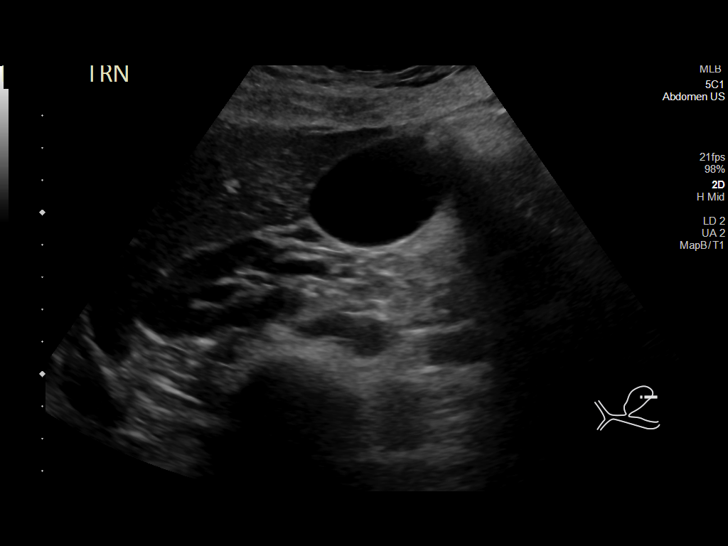
[im 13/34]
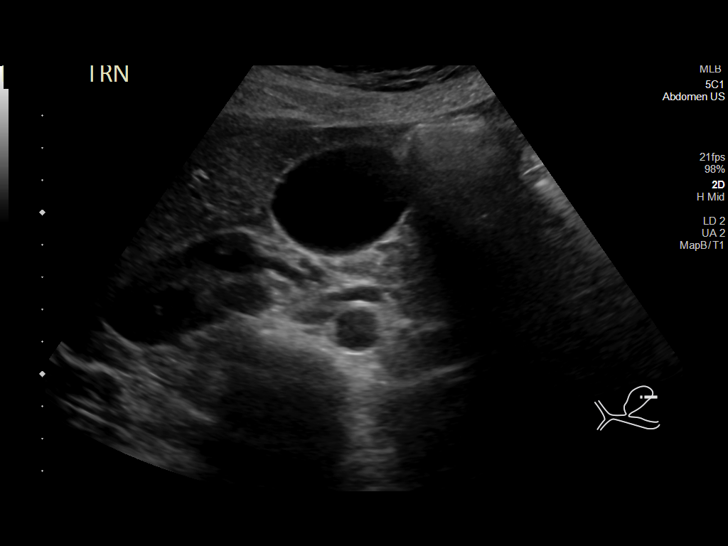
[im 16/34]
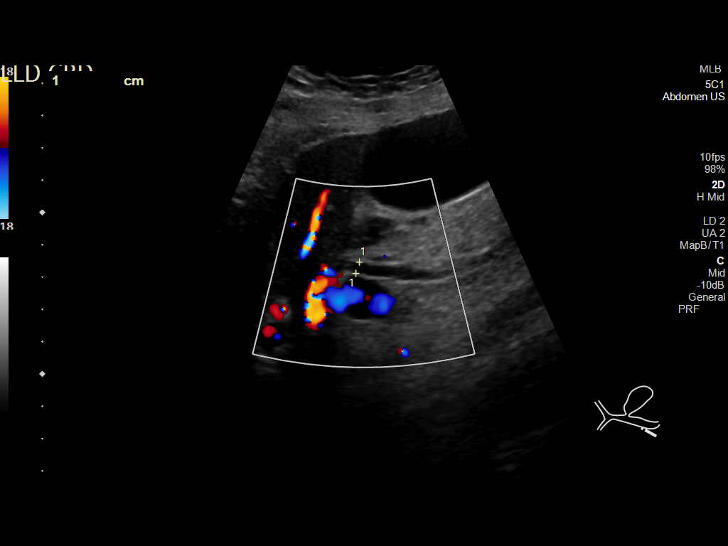
[im 18/34]
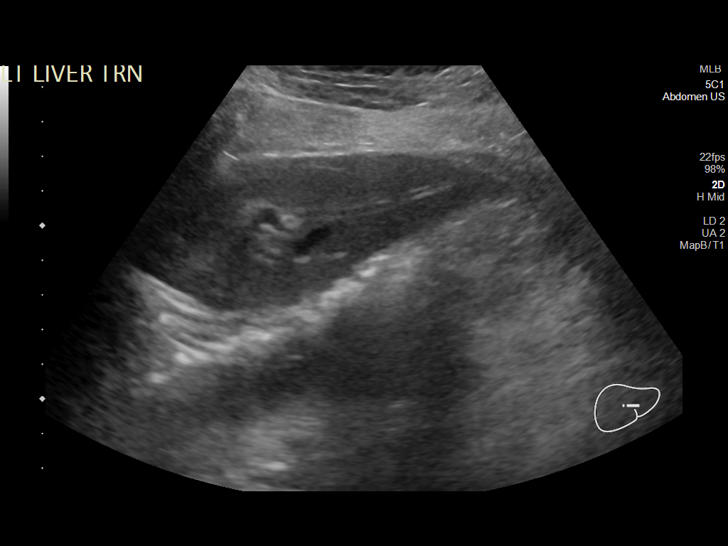
[im 21/34]
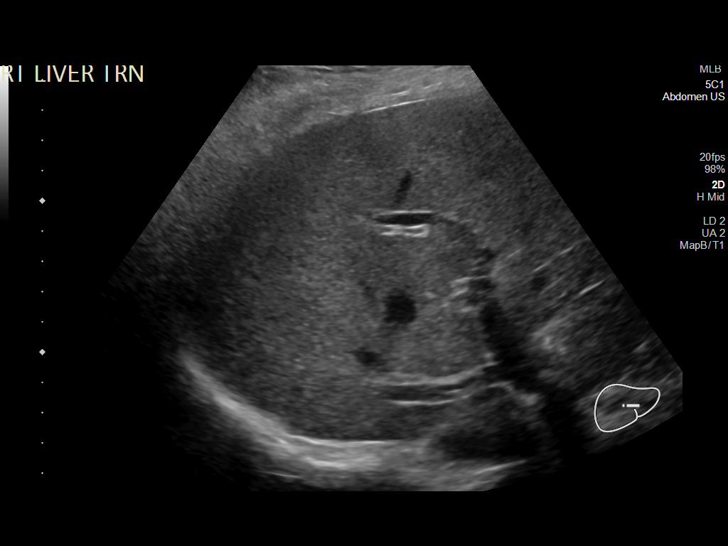
[im 23/34]
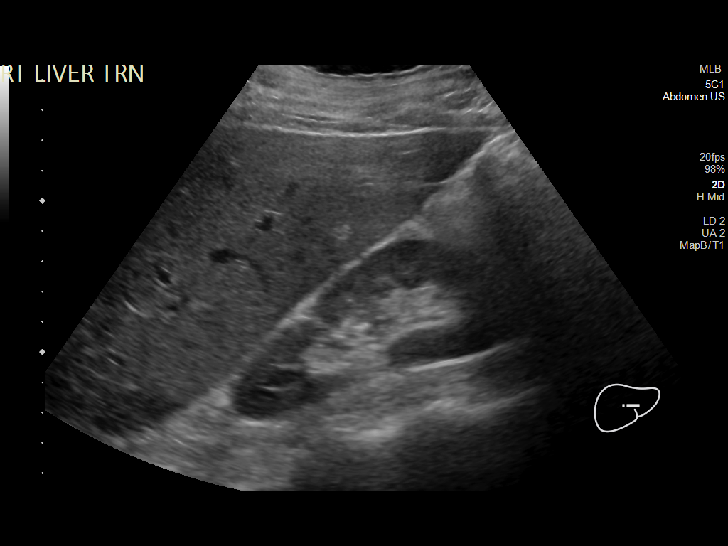
[im 25/34]
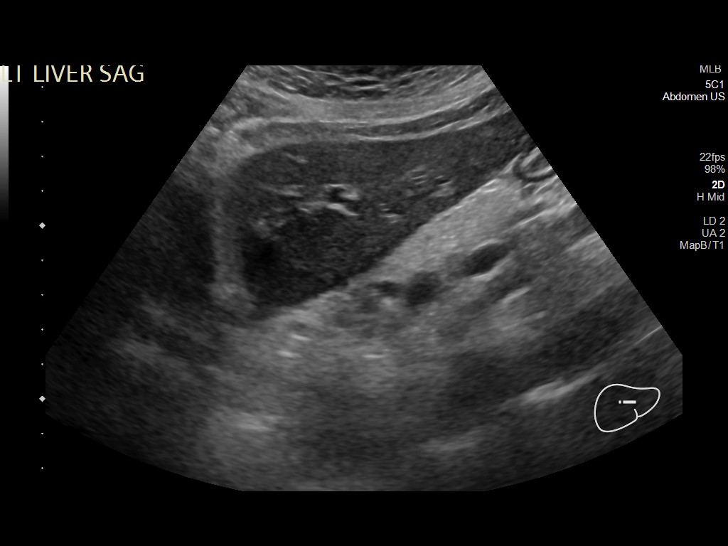
[im 28/34]
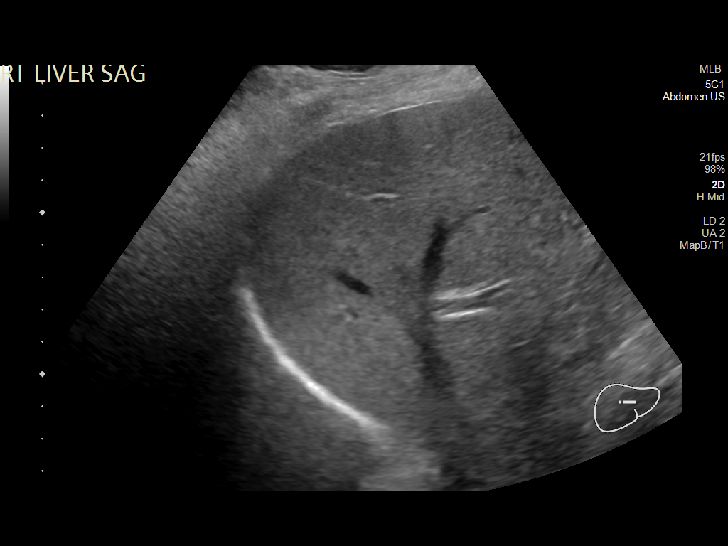
[im 31/34]
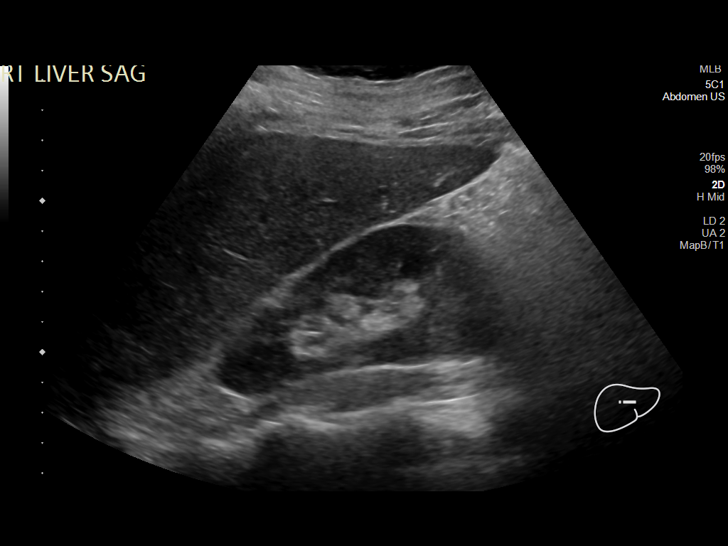
[im 34/34]
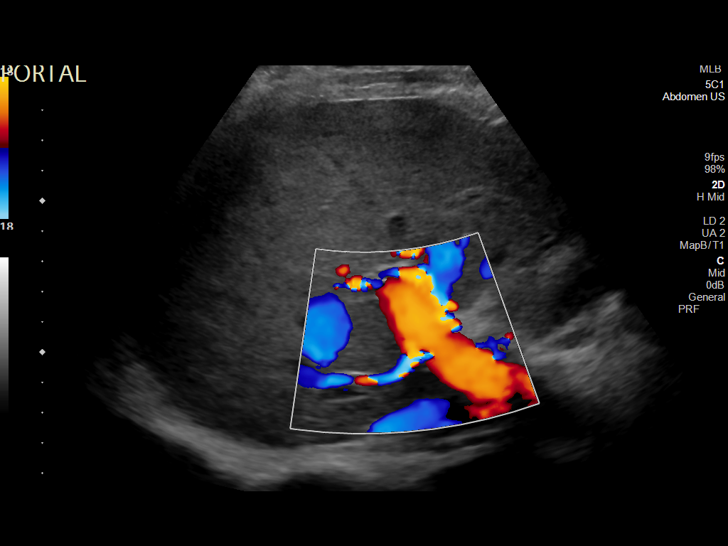

[14 of 25 positions shown; findings below may reference images not displayed]

FINDINGS: Gallbladder:

No gallstones or wall thickening visualized. No sonographic Murphy
sign noted by sonographer.

Common bile duct:

Diameter: 3.7 mm

Liver:

No focal lesion identified. Within normal limits in parenchymal
echogenicity. Portal vein is patent on color Doppler imaging with
normal direction of blood flow towards the liver.

Other: None.
IMPRESSION: No sonographic abnormality is seen in the right upper quadrant of
abdomen.

## 2023-11-11 IMAGING — CT CT ABD-PELV W/ CM
2 of 4 series · 15 of 46 positions shown, 17 images · IV contrast (APPLIED)
Comparison: Ultrasound of the abdomen on 04/26/2021, CT angio chest
08/18/2019

CLINICAL DATA: Epigastric pain. Pain radiates to the RIGHT
shoulder. Nausea, vomiting. Prior appendectomy and C-section.

EXAM:
CT ABDOMEN AND PELVIS WITH CONTRAST
TECHNIQUE: Multidetector CT imaging of the abdomen and pelvis was performed
using the standard protocol following bolus administration of
intravenous contrast.

[Series 2: abd pel w · axial · 0.67mm/px · z∈[+809,+1204]mm · 12 of 91 slices shown, 14 images]
[im 8/91  soft-tissue]
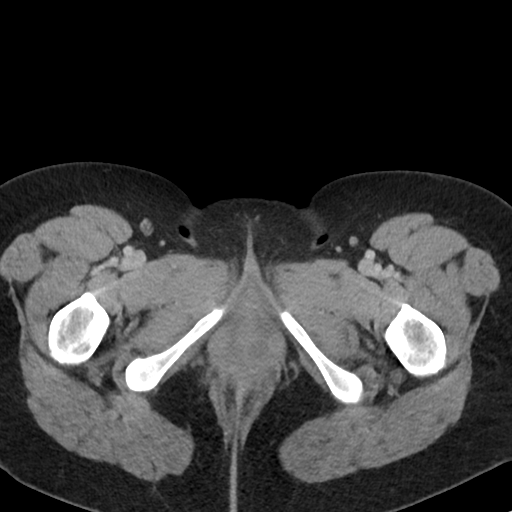
[im 8/91  bone]
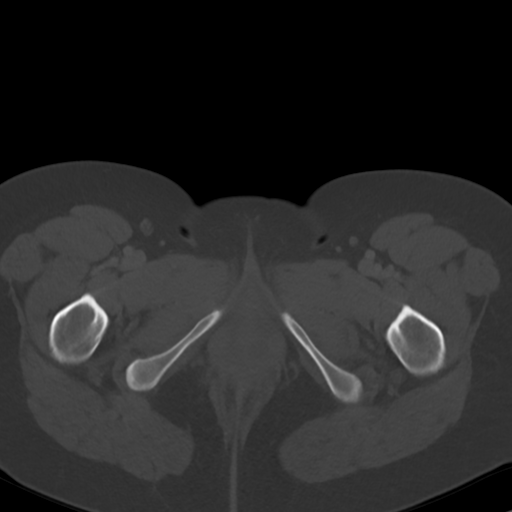
[im 15/91  soft-tissue]
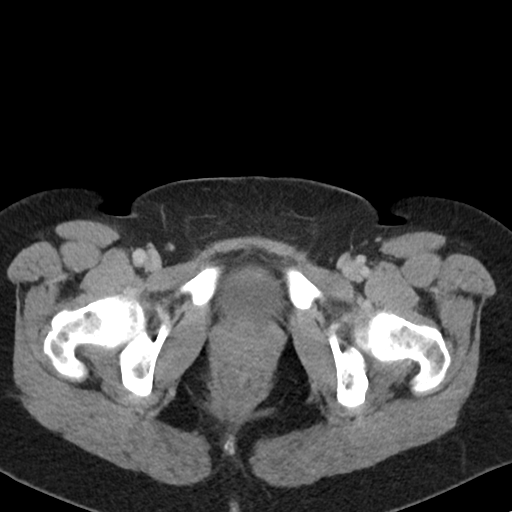
[im 22/91  soft-tissue]
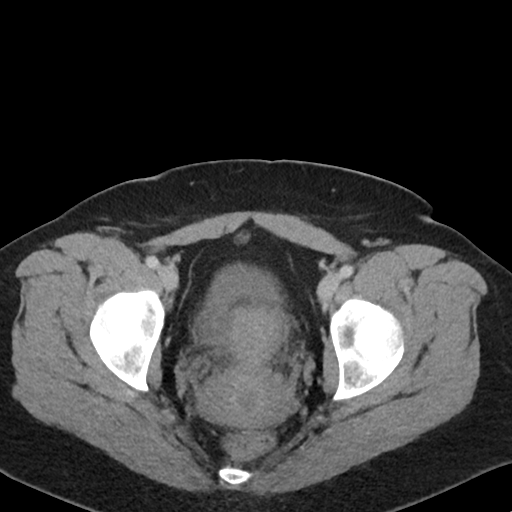
[im 29/91  soft-tissue]
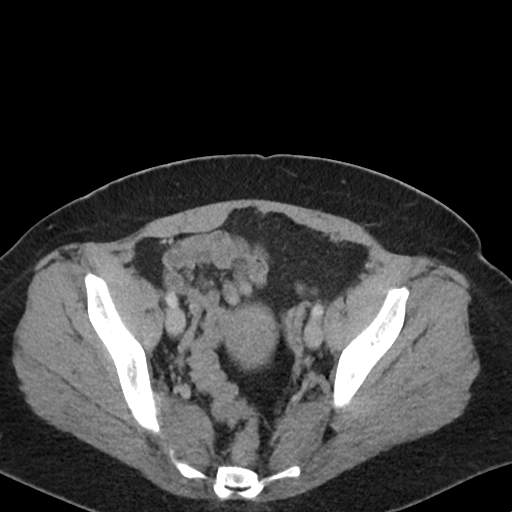
[im 37/91  soft-tissue]
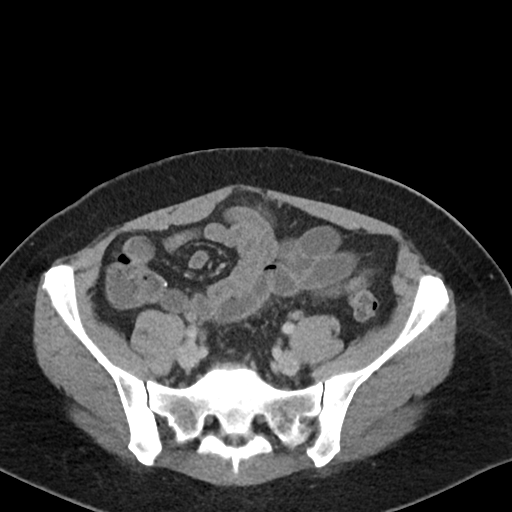
[im 44/91  soft-tissue]
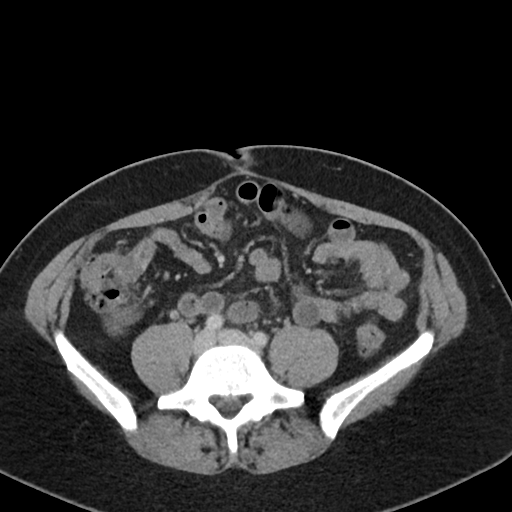
[im 51/91  soft-tissue]
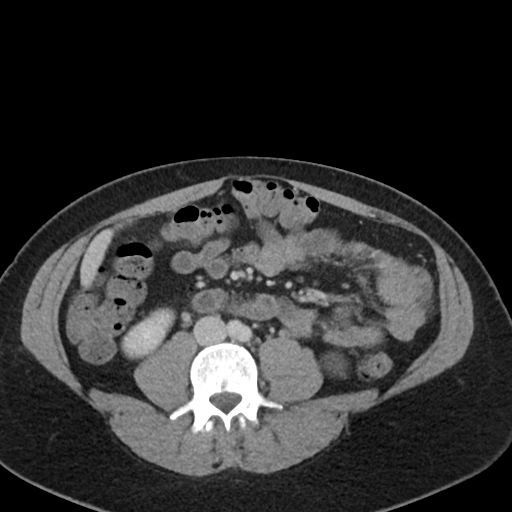
[im 58/91  soft-tissue]
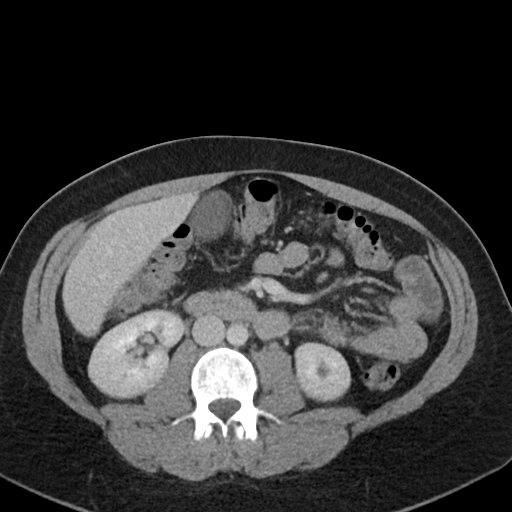
[im 65/91  soft-tissue]
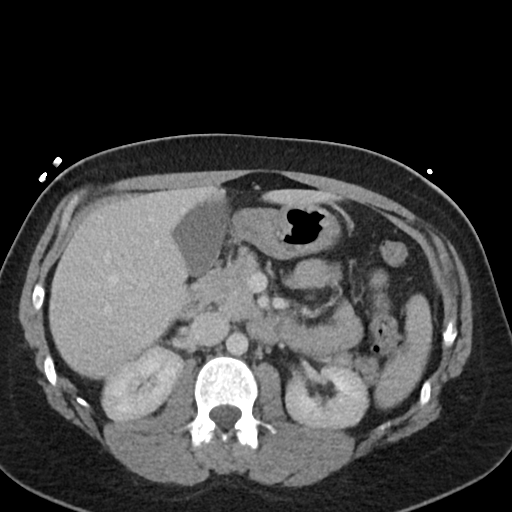
[im 65/91  bone]
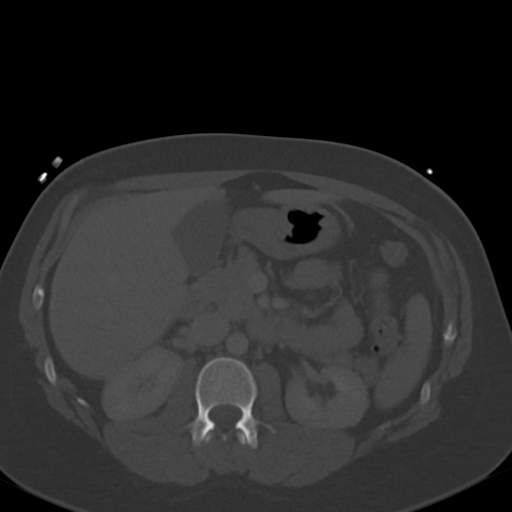
[im 73/91  soft-tissue]
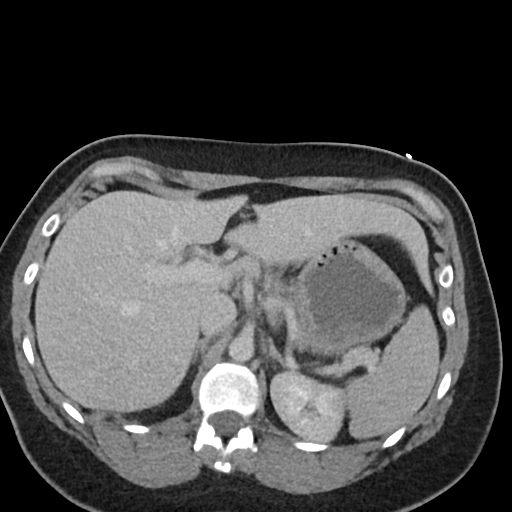
[im 80/91  soft-tissue]
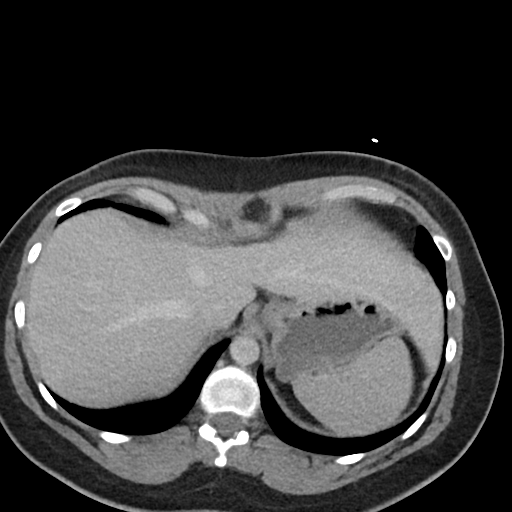
[im 87/91  soft-tissue]
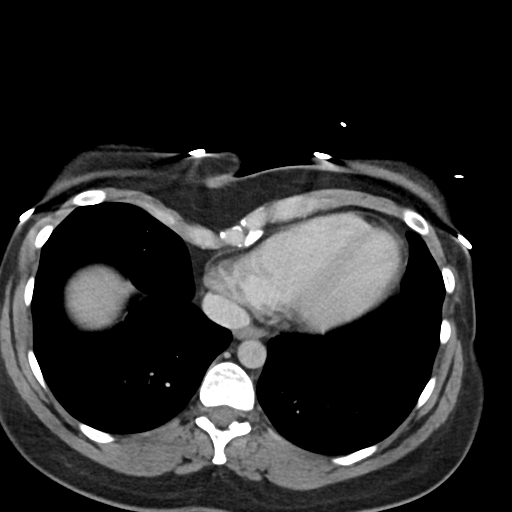

[Series 5: coronal · coronal · 0.69mm/px · 3 of 94 slices shown]
[im 32/94  soft-tissue]
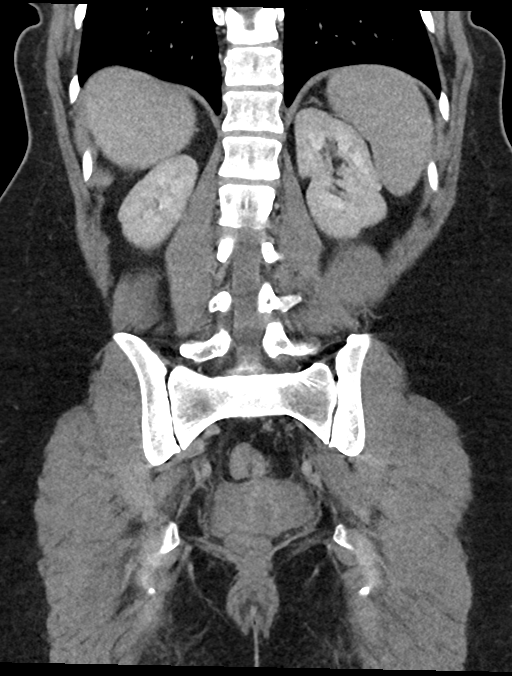
[im 42/94  soft-tissue]
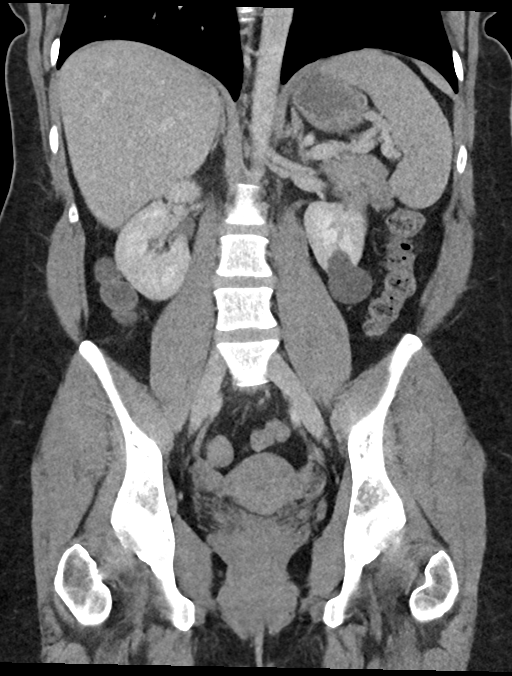
[im 52/94  soft-tissue]
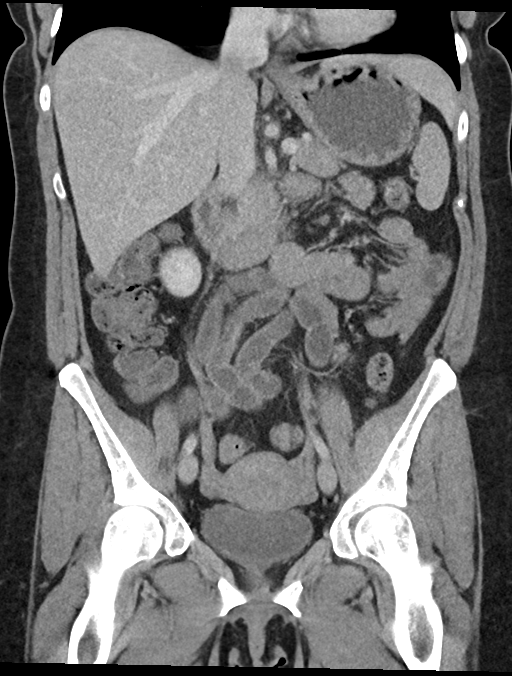

[15 of 46 positions shown; findings below may reference images not displayed]

RADIATION DOSE REDUCTION: This exam was performed according to the
departmental dose-optimization program which includes automated
exposure control, adjustment of the mA and/or kV according to
patient size and/or use of iterative reconstruction technique.

CONTRAST:  85mL OMNIPAQUE IOHEXOL 300 MG/ML  SOLN
FINDINGS: Lower chest: No acute abnormality. Pectus excavatum noted, with AP
diameter of the chest measuring 7.2 centimeters from sternum to
thoracic spine.

Hepatobiliary: No focal liver abnormality is seen. No radiopaque
gallstones, biliary dilatation, or pericholecystic inflammatory
changes.

Pancreas: Unremarkable. No pancreatic ductal dilatation or
surrounding inflammatory changes.

Spleen: Normal in size without focal abnormality.

Adrenals/Urinary Tract: Adrenal glands are normal.

RIGHT kidney is unremarkable.  RIGHT ureter course is normal.

Cyst in the LOWER pole of the LEFT kidney measures 3.9 centimeters
in diameter. There is no hydronephrosis. LEFT ureter is
unremarkable.

Stomach/Bowel: The stomach and small bowel loops are normal in
appearance. Surgical clips in the region of cecum consistent with
previous appendectomy. Loops of colon are unremarkable.

Vascular/Lymphatic: No significant vascular findings are present. No
enlarged abdominal or pelvic lymph nodes.

Reproductive: Uterus and bilateral adnexa are unremarkable.

Other: No abdominal wall hernia or abnormality. No abdominopelvic
ascites.

Musculoskeletal: No fracture is seen.
IMPRESSION: 1. No acute abnormality of the abdomen pelvis.
2. Pectus excavatum.
3. Benign LEFT renal cyst.
4. Prior appendectomy.

## 2023-11-20 ENCOUNTER — Encounter: Admitting: Internal Medicine

## 2023-11-21 ENCOUNTER — Other Ambulatory Visit

## 2023-11-21 DIAGNOSIS — Z13220 Encounter for screening for lipoid disorders: Secondary | ICD-10-CM

## 2023-11-21 DIAGNOSIS — E039 Hypothyroidism, unspecified: Secondary | ICD-10-CM

## 2023-11-21 DIAGNOSIS — Z0000 Encounter for general adult medical examination without abnormal findings: Secondary | ICD-10-CM

## 2023-11-22 ENCOUNTER — Ambulatory Visit: Payer: Self-pay | Admitting: Internal Medicine

## 2023-11-22 LAB — LIPID PANEL
Cholesterol: 154 mg/dL (ref <200)
HDL: 59 mg/dL (ref >=50)
LDL Cholesterol (Calc): 79 mg/dL
Non-HDL Cholesterol (Calc): 95 mg/dL (ref <130)
Total CHOL/HDL Ratio: 2.6 (calc) (ref <5.0)
Triglycerides: 77 mg/dL (ref <150)

## 2023-11-22 LAB — CBC WITH DIFFERENTIAL/PLATELET
Absolute Lymphocytes: 2421 {cells}/uL (ref 850–3900)
Absolute Monocytes: 567 {cells}/uL (ref 200–950)
Basophils Absolute: 36 {cells}/uL (ref 0–200)
Basophils Relative: 0.4 %
Eosinophils Absolute: 189 {cells}/uL (ref 15–500)
Eosinophils Relative: 2.1 %
HCT: 40.8 % (ref 35.0–45.0)
Hemoglobin: 13.1 g/dL (ref 11.7–15.5)
MCH: 29 pg (ref 27.0–33.0)
MCHC: 32.1 g/dL (ref 32.0–36.0)
MCV: 90.5 fL (ref 80.0–100.0)
MPV: 9.3 fL (ref 7.5–12.5)
Monocytes Relative: 6.3 %
Neutro Abs: 5787 {cells}/uL (ref 1500–7800)
Neutrophils Relative %: 64.3 %
Platelets: 265 Thousand/uL (ref 140–400)
RBC: 4.51 Million/uL (ref 3.80–5.10)
RDW: 12.9 % (ref 11.0–15.0)
Total Lymphocyte: 26.9 %
WBC: 9 Thousand/uL (ref 3.8–10.8)

## 2023-11-22 LAB — COMPREHENSIVE METABOLIC PANEL WITH GFR
AG Ratio: 2.5 (calc) (ref 1.0–2.5)
ALT: 12 U/L (ref 6–29)
AST: 15 U/L (ref 10–30)
Albumin: 4.5 g/dL (ref 3.6–5.1)
Alkaline phosphatase (APISO): 79 U/L (ref 31–125)
BUN: 12 mg/dL (ref 7–25)
CO2: 27 mmol/L (ref 20–32)
Calcium: 9 mg/dL (ref 8.6–10.2)
Chloride: 103 mmol/L (ref 98–110)
Creat: 0.76 mg/dL (ref 0.50–0.97)
Globulin: 1.8 g/dL — ABNORMAL LOW (ref 1.9–3.7)
Glucose, Bld: 84 mg/dL (ref 65–99)
Potassium: 4.1 mmol/L (ref 3.5–5.3)
Sodium: 138 mmol/L (ref 135–146)
Total Bilirubin: 0.4 mg/dL (ref 0.2–1.2)
Total Protein: 6.3 g/dL (ref 6.1–8.1)
eGFR: 104 mL/min/1.73m2 (ref >=60)

## 2023-11-22 LAB — TSH: TSH: 1.42 m[IU]/L

## 2023-11-23 ENCOUNTER — Ambulatory Visit: Admitting: Internal Medicine

## 2023-11-23 ENCOUNTER — Encounter: Payer: Self-pay | Admitting: Internal Medicine

## 2023-11-23 VITALS — BP 102/80 | HR 62 | Ht 68.0 in | Wt 215.0 lb

## 2023-11-23 DIAGNOSIS — Z0000 Encounter for general adult medical examination without abnormal findings: Secondary | ICD-10-CM | POA: Diagnosis not present

## 2023-11-23 LAB — POCT URINALYSIS DIP (CLINITEK)
Bilirubin, UA: NEGATIVE
Blood, UA: NEGATIVE
Glucose, UA: NEGATIVE mg/dL
Ketones, POC UA: NEGATIVE mg/dL
Leukocytes, UA: NEGATIVE
Nitrite, UA: NEGATIVE
POC PROTEIN,UA: NEGATIVE
Spec Grav, UA: 1.01 (ref 1.010–1.025)
Urobilinogen, UA: 0.2 U/dL
pH, UA: 6.5 (ref 5.0–8.0)

## 2023-11-23 NOTE — Progress Notes (Shared)
 Comprehensive Physical Exam    Patient Care Team: Carla Ronal PARAS, MD as PCP - General (Internal Medicine)  Visit Date: 11/23/23   Chief Complaint  Patient presents with   Annual Exam   Subjective:  Patient: Carla Gonzalez, Female DOB: 1985/06/29, 38 y.o. MRN: [id number] Vitals:   11/23/23 1358  BP: 102/80   Carla Gonzalez is a 38 y.o. Female who presents today for her Comprehensive Physical Exam. Patient has Fragile x chromosome; Cesarean delivery due to previous difficult delivery, delivered, current hospitalization; Fetal malpresentation, delivered, current hospitalization; Symptomatic bradycardia; Dilated pulmonary artery; Chronic migraine without aura without status migrainosus, not intractable; and Migraine without aura and without status migrainosus, not intractable on their problem list.  She says she feels well     History of Persistent vertigo and Rightward nystagmus. Thought to be probably benign positional vertigo which may be aggravated by stress.  This is alarming to her because it comes on without warning and makes her feel a bit out of control.  She has a stressful job.  She has 2 children and sometimes they were sick but her husband is quite supportive. We referred her to neurology and she was seen by Dr. Ines 02/2021 and given Medrol  dosepak. Later presented to the ER 09/10/2021 with vertigo suspected likely due to inner ear pathology; treated with IV fluids, Ativan , and meclizine . She was referred to vestibular rehab at [facility] center. Currently on a regimen of Maxalt  and Zofran  as needed. She has been having some congestion in her ears. She said she is going to try an OTC decongestant.    History of Migraines without aura without status migranosis treated with emgality  120 mg injection monthly and nurtec 75 daily as needed.   History of Hypothyroidism, treated with Levothyroxine  88 mcg. 11/21/2023 TSH is normal at 1.42    In June 2022 she  was noted to have issues with tachycardia and some vague dizziness with fatigue.  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 Apparently vertigo symptoms get worse after she arrives at work sometime around midmorning.  She had respiratory syncytial virus November 05, 2019.  She tested positive for rhinovirus in March 2022, had melanocytic nevus removed from left neck by dermatologist April 2022.   In November 2021, she had acute left otitis media.  In March 2022, she had protracted respiratory infection but children were ill. Had acute maxillary sinusitis in March 2022.   Had COVID-19 along with several members of her family on vacation in [place]  late Summer 2022.  In June 2021, she had recently given birth via C-section and subsequently developed some chest tightness associated with bradycardia for 2 days.  She noticed heart rate dropping to the 30s on her apple watch and she went to [facility] emergency department.  She had bradycardia with sinus arrhythmia with rate of 42.  BNP was mildly elevated at 270 and chest x-ray shows mild cardiomegaly with increased attenuation at the lung bases.  CTA was negative for PE but noted to have dilated main pulmonary artery with diameter of 3.8 cm.  CBC and electrolytes were normal.  She was admitted to the hospital and seen by Dr. Kelsie who felt that patient had increased vagal tone with recent pregnancy.  She was given 1 dose IV Lasix  with improvement of throat fullness sensation.  Echo was within normal limits she has not had recurrence.  .  Labs 11/21/2023 Globulin 1.8  Otherwise  WNL   Vaccine counseling: Declined hep c screening, getting influenza vaccine through work, received hep b vaccine through school. Declined covid-19 vaccine, Declined hpv screening.     Mammogram due will talk to gynecologist     Health Maintenance  Topic Date Due   Hepatitis B Vaccines 19-59 Average Risk (1 of 3 - 19+ 3-dose series)  Never done   Influenza Vaccine  10/06/2023   COVID-19 Vaccine (3 - 2025-26 season) 12/09/2023 (Originally 11/06/2023)   HPV VACCINES (1 - 3-dose SCDM series) 11/22/2024 (Originally 11/08/2014)   Hepatitis C Screening  11/22/2024 (Originally 11/07/2005)   Cervical Cancer Screening (HPV/Pap Cotest)  12/24/2025   DTaP/Tdap/Td (3 - Td or Tdap) 06/12/2029   HIV Screening  Completed   Pneumococcal Vaccine  Aged Out   Meningococcal B Vaccine  Aged Out     Review of Systems  All other systems reviewed and are negative.  Objective:  Vitals: body mass index is 32.69 kg/m. Today's Vitals   11/23/23 1358  BP: 102/80  Pulse: 62  SpO2: 98%  Weight: 215 lb (97.5 kg)  Height: 5' 8 (1.727 m)  PainSc: 0-No pain   Physical Exam Vitals and nursing note reviewed.  Constitutional:      General: She is not in acute distress.    Appearance: Normal appearance. She is not ill-appearing or toxic-appearing.  HENT:     Head: Normocephalic and atraumatic.     Right Ear: Hearing, tympanic membrane, ear canal and external ear normal.     Left Ear: Hearing, tympanic membrane, ear canal and external ear normal.     Ears:     Comments: Left tm full and dull     Mouth/Throat:     Pharynx: Oropharynx is clear.  Eyes:     Extraocular Movements: Extraocular movements intact.     Pupils: Pupils are equal, round, and reactive to light.  Neck:     Thyroid : No thyroid  mass, thyromegaly or thyroid  tenderness.     Vascular: No carotid bruit.  Cardiovascular:     Rate and Rhythm: Normal rate and regular rhythm. No extrasystoles are present.    Pulses:          Dorsalis pedis pulses are 2+ on the right side and 2+ on the left side.     Heart sounds: Normal heart sounds. No murmur heard.    No friction rub. No gallop.  Pulmonary:     Effort: Pulmonary effort is normal.     Breath sounds: Normal breath sounds. No decreased breath sounds, wheezing, rhonchi or rales.  Chest:     Chest wall: No mass.  Abdominal:      Palpations: Abdomen is soft. There is no hepatomegaly, splenomegaly or mass.     Tenderness: There is no abdominal tenderness.     Hernia: No hernia is present.  Genitourinary:    Comments: GYN deferred  Musculoskeletal:     Cervical back: Normal range of motion.     Right lower leg: No edema.     Left lower leg: No edema.  Lymphadenopathy:     Cervical: No cervical adenopathy.     Upper Body:     Right upper body: No supraclavicular adenopathy.     Left upper body: No supraclavicular adenopathy.  Skin:    General: Skin is warm and dry.  Neurological:     General: No focal deficit present.     Mental Status: She is alert and oriented to person, place, and time. Mental  status is at baseline.     Sensory: Sensation is intact.     Motor: Motor function is intact. No weakness.     Deep Tendon Reflexes: Reflexes are normal and symmetric.  Psychiatric:        Attention and Perception: Attention normal.        Mood and Affect: Mood normal.        Speech: Speech normal.        Behavior: Behavior normal.        Thought Content: Thought content normal.        Cognition and Memory: Cognition normal.        Judgment: Judgment normal.     Current Outpatient Medications  Medication Instructions   Emgality  120 mg, Subcutaneous, Every 30 days, Please use copay card:   levothyroxine  (SYNTHROID ) 100 mcg, Oral, Daily   Nurtec 75 mg, Oral, Daily PRN, For migraines. Take as close to onset of migraine as possible. One daily maximum. Please use copay card: BIN# [id number]  PCN# [id number]  GRP# [id number]  ID#  [id number] EXP 03/06/2024   ondansetron  (ZOFRAN -ODT) 4-8 mg, Oral, Every 8 hours PRN   rizatriptan  (MAXALT -MLT) 10 mg, Oral, As needed, May repeat in 2 hours if needed   Past Medical History:  Diagnosis Date   Hypothyroidism    Pectus excavatum    Renal cyst 2023   left; benign appearing   Medical/Surgical History Narrative:  Allergic/Intolerant to:  Allergies  Allergen Reactions   Sulfa  Antibiotics Hives    Past Surgical History:  Procedure Laterality Date   APPENDECTOMY     CESAREAN SECTION N/A 08/12/2019   Procedure: CESAREAN SECTION;  Surgeon: Gretta Gums, MD;  Location: [facility] LD ORS;  Service: Obstetrics;  Laterality: N/A;   Grommet     TONSILLECTOMY AND ADENOIDECTOMY  2009   Family History  Problem Relation Age of Onset   Hypertension Father    Melanoma Father    Migraines Neg Hx    Colon cancer Neg Hx    Stomach cancer Neg Hx    Headache Neg Hx    Family History Narrative: remarkable for father with history of melanoma and hypertension.  Social History   Social History Narrative   Works as cardiology NP.  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   She works as a Transport planner for [employer]. She is married and has 2 small children. She works long hours. Her husband is supportive. She does not smoke or consume alcohol. Youngest child has started  Most Recent Health Risks Assessment:   Most Recent Social Determinants of Health (Including Hx of Tobacco, Alcohol, and Drug Use) SDOH Screenings   Food Insecurity: No Food Insecurity (02/27/2023)  Housing: Unknown (02/27/2023)  Transportation Needs: No Transportation Needs (02/27/2023)  Depression (PHQ2-9): Low Risk  (08/03/2023)  Financial Resource Strain: Low Risk  (02/27/2023)  Physical Activity: Sufficiently Active (02/27/2023)  Social Connections: Socially Integrated (02/27/2023)  Stress: No Stress Concern Present (02/27/2023)  Tobacco Use: Low Risk  (11/23/2023)   Social History   Tobacco Use   Smoking status: Never   Smokeless tobacco: Never  Vaping Use   Vaping status: Never Used  Substance Use Topics   Alcohol use: Not Currently   Drug use: Never   Most Recent Functional Status Assessment:     No data to display         Most Recent Fall Risk Assessment:    03/28/2022   12:06 PM  Fall Risk   Falls in the past year?  0  Number falls in past yr: 0  Injury with Fall? 0  Risk for fall due to : No Fall Risks  Follow  up Falls prevention discussed      Data saved with a previous flowsheet row definition   Most Recent Anxiety/Depression Screenings:    08/03/2023   11:58 AM 03/28/2022   12:06 PM  PHQ 2/9 Scores  PHQ - 2 Score 0 0       No data to display         Most Recent Cognitive Screening:     No data to display         Most Recent Vision/Hearing Screenings:No results found. Results:  Studies Obtained And Personally Reviewed By Me:      Labs:  CBC w/ Differential Lab Results  Component Value Date   WBC 9.0 11/21/2023   RBC 4.51 11/21/2023   HGB 13.1 11/21/2023   HCT 40.8 11/21/2023   PLT 265 11/21/2023   MCV 90.5 11/21/2023   MCH 29.0 11/21/2023   MCHC 32.1 11/21/2023   RDW 12.9 11/21/2023   MPV 9.3 11/21/2023   LYMPHSABS 2,608 10/13/2022   MONOABS 0.5 09/10/2021   BASOSABS 36 11/21/2023    Comprehensive Metabolic Panel Lab Results  Component Value Date   NA 138 11/21/2023   K 4.1 11/21/2023   CL 103 11/21/2023   CO2 27 11/21/2023   GLUCOSE 84 11/21/2023   BUN 12 11/21/2023   CREATININE 0.76 11/21/2023   CALCIUM 9.0 11/21/2023   PROT 6.3 11/21/2023   ALBUMIN 4.6 04/26/2021   AST 15 11/21/2023   ALT 12 11/21/2023   ALKPHOS 59 04/26/2021   BILITOT 0.4 11/21/2023   EGFR 104 11/21/2023   GFRNONAA >60 09/10/2021   Lipid Panel  Lab Results  Component Value Date   CHOL 154 11/21/2023   HDL 59 11/21/2023   LDLCALC 79 11/21/2023   TRIG 77 11/21/2023   A1c Lab Results  Component Value Date   HGBA1C 5.5 08/18/2019    TSH Lab Results  Component Value Date   TSH 1.42 11/21/2023    Results for orders placed or performed in visit on 11/23/23  POCT URINALYSIS DIP (CLINITEK)  Result Value Ref Range   Color, UA yellow yellow   Clarity, UA clear clear   Glucose, UA negative negative mg/dL   Bilirubin, UA negative negative   Ketones, POC UA negative negative mg/dL   Spec Grav, UA 8.989 8.989 - 1.025   Blood, UA negative negative   pH, UA 6.5 5.0 -  8.0   POC PROTEIN,UA negative negative, trace   Urobilinogen, UA 0.2 0.2 or 1.0 E.U./dL   Nitrite, UA Negative Negative   Leukocytes, UA Negative Negative   Assessment & Plan:   Orders Placed This Encounter  Procedures   POCT URINALYSIS DIP (CLINITEK)   History of Persistent vertigo and Rightward nystagmus. Thought to be probably benign positional vertigo which may be aggravated by stress.  This is alarming to her because it comes on without warning and makes her feel a bit out of control.  She has a stressful job.  She has 2 children and sometimes they were sick but her husband is quite supportive. We referred her to neurology and she was seen by Dr. Ines 02/2021 and given Medrol  dosepak. Later presented to the ER 09/10/2021 with vertigo suspected likely due to inner ear pathology; treated with IV fluids, Ativan , and meclizine . She was referred to vestibular rehab at [facility] center.  Currently on a regimen of Maxalt  and Zofran  as needed. She has been having some congestion in her ears.   She said she is going to try a OTC decongestant.     Migraines without aura without status migranosis:  treated with emgality  120 mg injection monthly and nurtec 75 mg daily as needed.    Hypothyroidism: Treated with Levothyroxine  88 mcg. 11/21/2023 TSH is normal at 1.42  Vaccine counseling: Declined hep c screening, getting influenza vaccine through work, received hep b vaccine through school. Declined covid-19 vaccine, Declined hpv screening.     Mammogram due will talk to gynecologist     Comprehensive Physical exam done today including the all of the following: Reviewed patient's Family Medical History Reviewed patient's SDOH and reviewed tobacco, alcohol, and drug use.  Reviewed and updated list of patient's medical providers Assessment of cognitive impairment was done Assessed patient's functional ability Established a written schedule for health screening services Health Risk  Assessent Completed and Reviewed  Discussed health benefits of physical activity, and encouraged her to engage in regular exercise appropriate for her age and condition.   I,Emily Lagle,acting as a Neurosurgeon for Ronal JINNY Hailstone, MD.,have documented all relevant documentation on the behalf of Ronal JINNY Hailstone, MD,as directed by  Ronal JINNY Hailstone, MD while in the presence of Ronal JINNY Hailstone, MD.  I, Ronal JINNY Hailstone, MD, have reviewed all documentation for and agree with the above Annual Wellness Visit documentation.  Ronal JINNY Hailstone, MD Internal Medicine 11/23/2023

## 2023-11-24 NOTE — Patient Instructions (Signed)
 It was a pleasure to see you today. Labs are normal. Vaccines discussed. Will see GYN for GYn exam. Will receive flu vaccine through [facility] soon.

## 2023-12-20 ENCOUNTER — Other Ambulatory Visit (HOSPITAL_COMMUNITY): Payer: Self-pay

## 2024-02-14 ENCOUNTER — Other Ambulatory Visit (HOSPITAL_COMMUNITY): Payer: Self-pay

## 2024-03-08 ENCOUNTER — Other Ambulatory Visit: Payer: Self-pay

## 2024-03-08 ENCOUNTER — Other Ambulatory Visit: Payer: Self-pay | Admitting: Internal Medicine

## 2024-03-11 MED ORDER — LEVOTHYROXINE SODIUM 100 MCG PO TABS
100.0000 ug | ORAL_TABLET | Freq: Every day | ORAL | 1 refills | Status: AC
  Filled 2024-03-11: qty 90, 90d supply, fill #0

## 2024-03-12 ENCOUNTER — Other Ambulatory Visit (HOSPITAL_COMMUNITY): Payer: Self-pay

## 2024-03-15 ENCOUNTER — Other Ambulatory Visit (HOSPITAL_COMMUNITY): Payer: Self-pay

## 2024-04-01 ENCOUNTER — Other Ambulatory Visit (HOSPITAL_COMMUNITY): Payer: Self-pay

## 2024-05-14 ENCOUNTER — Ambulatory Visit: Admitting: Diagnostic Neuroimaging

## 2024-05-14 ENCOUNTER — Telehealth: Admitting: Neurology
